# Patient Record
Sex: Female | Born: 1975 | Race: Black or African American | Hispanic: No | Marital: Single | Smoking: Never smoker
Health system: Southern US, Community
[De-identification: ages and names within clinical notes are randomized; demographics above are authoritative.]

## PROBLEM LIST (undated history)

## (undated) DIAGNOSIS — R768 Other specified abnormal immunological findings in serum: Secondary | ICD-10-CM

## (undated) DIAGNOSIS — E663 Overweight: Secondary | ICD-10-CM

## (undated) DIAGNOSIS — I10 Essential (primary) hypertension: Secondary | ICD-10-CM

## (undated) HISTORY — PX: CYST EXCISION: SHX5701

## (undated) HISTORY — DX: Other specified abnormal immunological findings in serum: R76.8

## (undated) HISTORY — DX: Essential (primary) hypertension: I10

## (undated) HISTORY — DX: Overweight: E66.3

---

## 1999-03-20 ENCOUNTER — Encounter: Payer: Self-pay | Admitting: Emergency Medicine

## 1999-03-20 ENCOUNTER — Emergency Department (HOSPITAL_COMMUNITY): Admission: EM | Admit: 1999-03-20 | Discharge: 1999-03-20 | Payer: Self-pay | Admitting: Emergency Medicine

## 2005-05-26 HISTORY — PX: SALPINGOOPHORECTOMY: SHX82

## 2009-06-18 ENCOUNTER — Ambulatory Visit: Payer: Self-pay | Admitting: Family

## 2009-06-18 DIAGNOSIS — J329 Chronic sinusitis, unspecified: Secondary | ICD-10-CM | POA: Insufficient documentation

## 2009-06-18 DIAGNOSIS — I10 Essential (primary) hypertension: Secondary | ICD-10-CM | POA: Insufficient documentation

## 2009-06-18 DIAGNOSIS — F329 Major depressive disorder, single episode, unspecified: Secondary | ICD-10-CM

## 2009-06-18 DIAGNOSIS — F419 Anxiety disorder, unspecified: Secondary | ICD-10-CM

## 2009-06-19 ENCOUNTER — Encounter (INDEPENDENT_AMBULATORY_CARE_PROVIDER_SITE_OTHER): Payer: Self-pay | Admitting: *Deleted

## 2009-07-03 ENCOUNTER — Ambulatory Visit: Payer: Self-pay | Admitting: Family

## 2009-07-03 LAB — CONVERTED CEMR LAB
ALT: 17 units/L (ref 0–35)
AST: 13 units/L (ref 0–37)
Albumin: 4.1 g/dL (ref 3.5–5.2)
Alkaline Phosphatase: 44 units/L (ref 39–117)
Basophils Absolute: 0 10*3/uL (ref 0.0–0.1)
Basophils Relative: 0.5 % (ref 0.0–3.0)
Bilirubin, Direct: 0.1 mg/dL (ref 0.0–0.3)
Cholesterol: 199 mg/dL (ref 0–200)
Eosinophils Absolute: 0.1 10*3/uL (ref 0.0–0.7)
Eosinophils Relative: 1.2 % (ref 0.0–5.0)
HCT: 42.4 % (ref 36.0–46.0)
HDL: 69.7 mg/dL (ref >39.00)
Hemoglobin: 13.1 g/dL (ref 12.0–15.0)
LDL Cholesterol: 118 mg/dL — ABNORMAL HIGH (ref 0–99)
Lymphocytes Relative: 29.7 % (ref 12.0–46.0)
Lymphs Abs: 1.9 10*3/uL (ref 0.7–4.0)
MCHC: 31 g/dL (ref 30.0–36.0)
MCV: 78.9 fL (ref 78.0–100.0)
Monocytes Absolute: 0.5 10*3/uL (ref 0.1–1.0)
Monocytes Relative: 8.2 % (ref 3.0–12.0)
Neutro Abs: 3.9 10*3/uL (ref 1.4–7.7)
Neutrophils Relative %: 60.4 % (ref 43.0–77.0)
Platelets: 267 10*3/uL (ref 150.0–400.0)
RBC: 5.37 M/uL — ABNORMAL HIGH (ref 3.87–5.11)
RDW: 13.1 % (ref 11.5–14.6)
TSH: 0.81 microintl units/mL (ref 0.35–5.50)
Total Bilirubin: 0.8 mg/dL (ref 0.3–1.2)
Total CHOL/HDL Ratio: 3
Total Protein: 7.1 g/dL (ref 6.0–8.3)
Triglycerides: 56 mg/dL (ref 0.0–149.0)
VLDL: 11.2 mg/dL (ref 0.0–40.0)
WBC: 6.4 10*3/uL (ref 4.5–10.5)

## 2009-07-04 ENCOUNTER — Encounter: Payer: Self-pay | Admitting: Family

## 2009-09-03 ENCOUNTER — Telehealth: Payer: Self-pay | Admitting: Family

## 2009-09-12 ENCOUNTER — Ambulatory Visit: Payer: Self-pay | Admitting: Family

## 2009-09-12 DIAGNOSIS — L0292 Furuncle, unspecified: Secondary | ICD-10-CM | POA: Insufficient documentation

## 2009-09-12 DIAGNOSIS — A6000 Herpesviral infection of urogenital system, unspecified: Secondary | ICD-10-CM | POA: Insufficient documentation

## 2009-09-12 DIAGNOSIS — L738 Other specified follicular disorders: Secondary | ICD-10-CM

## 2009-09-12 DIAGNOSIS — L0293 Carbuncle, unspecified: Secondary | ICD-10-CM

## 2009-09-14 ENCOUNTER — Telehealth: Payer: Self-pay | Admitting: Family

## 2009-09-17 ENCOUNTER — Encounter: Payer: Self-pay | Admitting: Family

## 2009-10-15 ENCOUNTER — Ambulatory Visit: Payer: Self-pay | Admitting: Family

## 2009-10-15 DIAGNOSIS — R238 Other skin changes: Secondary | ICD-10-CM

## 2009-10-15 DIAGNOSIS — R61 Generalized hyperhidrosis: Secondary | ICD-10-CM

## 2009-11-19 ENCOUNTER — Ambulatory Visit: Payer: Self-pay | Admitting: Family

## 2009-11-20 ENCOUNTER — Telehealth: Payer: Self-pay | Admitting: Family

## 2009-11-22 ENCOUNTER — Telehealth: Payer: Self-pay | Admitting: Family

## 2010-02-14 ENCOUNTER — Telehealth: Payer: Self-pay | Admitting: Family

## 2010-03-06 ENCOUNTER — Telehealth: Payer: Self-pay | Admitting: Family

## 2010-03-12 ENCOUNTER — Encounter: Payer: Self-pay | Admitting: Family

## 2010-04-10 ENCOUNTER — Telehealth: Payer: Self-pay | Admitting: Family

## 2010-04-22 ENCOUNTER — Ambulatory Visit: Payer: Self-pay | Admitting: Family

## 2010-04-22 DIAGNOSIS — E669 Obesity, unspecified: Secondary | ICD-10-CM

## 2010-04-22 LAB — CONVERTED CEMR LAB
BUN: 7 mg/dL (ref 6–23)
CO2: 26 meq/L (ref 19–32)
Calcium: 9.1 mg/dL (ref 8.4–10.5)
Chloride: 103 meq/L (ref 96–112)
Creatinine, Ser: 0.73 mg/dL (ref 0.40–1.20)
Glucose, Bld: 99 mg/dL (ref 70–99)
Potassium: 3.9 meq/L (ref 3.5–5.3)
Sodium: 138 meq/L (ref 135–145)

## 2010-04-23 ENCOUNTER — Telehealth: Payer: Self-pay | Admitting: Family

## 2010-04-24 ENCOUNTER — Encounter: Payer: Self-pay | Admitting: Family

## 2010-05-06 ENCOUNTER — Ambulatory Visit: Payer: Self-pay | Admitting: Licensed Clinical Social Worker

## 2010-06-24 ENCOUNTER — Telehealth: Payer: Self-pay | Admitting: Family

## 2010-06-25 NOTE — Progress Notes (Signed)
Summary: REFILL REQUEST--lm  Phone Note Refill Request Message from:  Fax from Pharmacy on September 03, 2009 10:10 AM  Refills Requested: Medication #1:  SERTRALINE 25MG  TABLET   Dosage confirmed as above?Dosage Confirmed   Brand Name Necessary? No   Supply Requested: 1 month   Last Refilled: 07/30/2009 [facility] [address] G[place]ette [place] PHONE [phone] FAX [phone]   Method Requested: Electronic Next Appointment Scheduled: NONE  Initial call taken by: Roselle Locus,  September 03, 2009 10:11 AM  Follow-up for Phone Call        Pt needs f/u now.  Left message on machine to return call.  Mervin Kung CMA  September 03, 2009 10:32 AM   Left message on machine to return call.  Mervin Kung CMA  September 04, 2009 8:34 AM   Left messag on pt's voicemail that a 30 day supply has been sent to the pharmacy but we will not be able to give further refills until pt. is seen.  Advised pt. to contact our office and schedule a follow-up with [name], NP.  Nicki Guadalajara Fergerson CMA  September 05, 2009 9:23 AM     New/Updated Medications: ZOLOFT 25 MG TABS (SERTRALINE HCL) Take one tablet by mouth daily Prescriptions: ZOLOFT 25 MG TABS (SERTRALINE HCL) Take one tablet by mouth daily  #30 x 0   Entered by:   Mervin Kung CMA   Authorized by:   Lemont Fillers FNP   Signed by:   Mervin Kung CMA on 09/05/2009   Method used:   Electronically to        [facility] [address] #[id number]* (retail)       [address]       [place], [place]  [address]       Ph: [phone]       Fax: [phone]   RxID:   [id number]   Current Allergies: No known allergies

## 2010-06-25 NOTE — Assessment & Plan Note (Signed)
Summary: 1 MONTH FOLLOW --room 5    Vital Signs:  Patient profile:   35 year old female Height:      66.5 inches Weight:      226 pounds BMI:     36.06 Temp:     98.3 degrees F oral Pulse rate:   72 / minute Pulse rhythm:   regular Resp:     18 per minute BP sitting:   132 / 90 Cuff size:   large  Vitals Entered By: Mervin Kung CMA (Oct 15, 2009 3:41 PM) CC: room 5  1 month follow up. Is Patient Diabetic? No   CC:  room 5  1 month follow up.Marland Kitchen  History of Present Illness: Whitney Harmon is a 35 year old female who presents today for follow up:  1) Anxiety- Last visit her Zoloft was increased from 25 mg to 50 mg. She notes decreased frequency of panic attacks since this was increased.  Overall feels calmer.  2) Hives- notes that these seem to be less frequent since she was taken off of her ACE inhibitor.  When this does occur she notices most frequently when she is stressed.  3) Sweating- notes that she sweats profusely which has become worse recently.  Feels "like my deodorant has stopped working"  4) Ecchymosis- Notes that she had some bruising in her legs last week which has resolved.    5) Folliculitis- notes frequent "boils" which she soaks and drains.  Responded well to Keflex recently- reports stable at present.  Allergies (verified): No Known Drug Allergies  Physical Exam  Lungs:  Normal respiratory effort, chest expands symmetrically. Lungs are clear to auscultation, no crackles or wheezes. Heart:  Normal rate and regular rhythm. S1 and S2 normal without gallop, murmur, click, rub or other extra sounds. Psych:  Cognition and judgment appear intact. Alert and cooperative with normal attention span and concentration. No apparent delusions, illusions, hallucinations   Impression & Recommendations:  Problem # 1:  ANXIETY (ICD-300.00) Assessment Improved Patient seems calmer this visit.  She is pleased with the progress she has made since we increased zoloft.   Continue current dosing. Her updated medication list for this problem includes:    Zoloft 50 Mg Tabs (Sertraline hcl) ..... One tablet by mouth daily  Problem # 2:  HYPERTENSION (ICD-401.9) Assessment: New Patient's weight loss clinic noted concern that her BB may be interfering with her weight loss plan.  Will change to Norvasc, plan follow up in 1 month. Her updated medication list for this problem includes:    Hydrochlorothiazide 25 Mg Tabs (Hydrochlorothiazide) ..... One tablet by mouth daily    Norvasc 5 Mg Tabs (Amlodipine besylate) ..... One tablet by mouth daily  Problem # 3:  ECCHYMOSES (ICD-782.9) Assessment: Improved Last CBC was stable, ecchymoses are resolving.  Monitor.  Problem # 4:  HYPERHIDROSIS (ICD-780.8) Assessment: New Will give trial of Drysol.    Complete Medication List: 1)  Zoloft 50 Mg Tabs (Sertraline hcl) .... One tablet by mouth daily 2)  Hydrochlorothiazide 25 Mg Tabs (Hydrochlorothiazide) .... One tablet by mouth daily 3)  Phentermine Hcl 15 Mg Caps (Phentermine hcl) .... Take 1 tab once daily 4)  Norvasc 5 Mg Tabs (Amlodipine besylate) .... One tablet by mouth daily 5)  Valtrex 1 Gm Tabs (Valacyclovir hcl) .... One tablet by mouth daily 6)  Drysol 20 % Soln (Aluminum chloride) .... Apply once daily  Patient Instructions: 1)  Please schedule a follow-up appointment in 1 month. Prescriptions: DRYSOL  20 % SOLN (ALUMINUM CHLORIDE) apply once daily  #1 x 3   Entered and Authorized by:   Lemont Fillers FNP   Signed by:   Lemont Fillers FNP on 10/15/2009   Method used:   Electronically to        [facility] #[id number]* (retail)       [address]       [place], [place]  [address]       Ph: [phone]       Fax: [phone]   RxID:   [id number] NORVASC 5 MG TABS (AMLODIPINE BESYLATE) one tablet by mouth daily  #30 x 1   Entered and Authorized by:   Lemont Fillers FNP   Signed by:    Lemont Fillers FNP on 10/15/2009   Method used:   Electronically to        [facility] #[id number]* (retail)       [address]       [place], [place]  [address]       Ph: [phone]       Fax: [phone]   RxID:   [id number]   Current Allergies (reviewed today): No known allergies

## 2010-06-25 NOTE — Letter (Signed)
   [place] at [facility] [address] [place], [place]  [address]  [place] Phone: [phone]      April 24, 2010   [facility] [address] [place], [place] [address]  RE:  LAB RESULTS  Dear  Whitney Harmon,  The following is an interpretation of your most recent lab tests.  Please take note of any instructions provided or changes to medications that have resulted from your lab work.  ELECTROLYTES:  Good - no changes needed  KIDNEY FUNCTION TESTS:  Good - no changes needed     Sincerely Yours,    Lemont Fillers FNP  Appended Document:  mailed

## 2010-06-25 NOTE — Letter (Signed)
Summary: Generic Letter  [place] [facility]  [address]   [place], [place] [address]   Phone: [phone]  Fax: [phone]      03/12/2010    [facility] [address] [place], [place]  [address]   Dear Ms. Meester,  We have been trying to reach you by phone and have been unsuccessful. Please let us know if your telephone numbers have recently changed.   Please call our office at [phone] Monday through Friday from 8am to 5pm to schedule an appointment with [name], NP. It is time to follow up with you on your blood pressure.  Sincerely,    [name], NP  Appended Document: Generic Letter Mailed.

## 2010-06-25 NOTE — Progress Notes (Signed)
Summary: needs appt--mailed letter  Phone Note Outgoing Call   Summary of Call: Please call patient and arrange a follow up visit. We need to follow up on her blood pressure. Initial call taken by: Lemont Fillers FNP,  March 06, 2010 4:33 PM  Follow-up for Phone Call        Attempted to reach pt. No answer, no voicemail. Nicki Guadalajara Fergerson CMA Duncan Dull)  March 08, 2010 11:42 AM   Additional Follow-up for Phone Call Additional follow up Details #1::        Unable to leave message on home phone. Left message to return my call at work number. Nicki Guadalajara Fergerson CMA Duncan Dull)  March 11, 2010 9:24 AM     Additional Follow-up for Phone Call Additional follow up Details #2::    Unable to reach pt by phone. Mailed contact letter to pt. Nicki Guadalajara Fergerson CMA Duncan Dull)  March 12, 2010 9:09 AM

## 2010-06-25 NOTE — Assessment & Plan Note (Signed)
Summary: 1 month fu/dt--Rm 4   Vital Signs:  Patient profile:   35 year old female Height:      66.5 inches Temp:     97.4 degrees F oral Pulse rate:   72 / minute Pulse rhythm:   regular Resp:     16 per minute BP sitting:   150 / 90  (right arm) Cuff size:   large  Vitals Entered By: Mervin Kung CMA (November 19, 2009 1:31 PM) CC: Room 4  1 month follow up.  Pt states she has been out of Norvasc x 3 days.   Is Patient Diabetic? No   CC:  Room 4  1 month follow up.  Pt states she has been out of Norvasc x 3 days.  Marland Kitchen  History of Present Illness: Whitney Harmon is a 35 year old female who presents today for follow up of her HTN.  Has not taken her norvasc. Reports that she went out of town and forgot to bring her meds- plans to pick up norvasc today.  Notes that BP was "good" at the bariatric center.    Anxiety-  notes she has difficultly "sitting still"  Denies recent panic attacks.    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 No apparent alopecia or balding. Lungs:  Normal respiratory effort, chest expands symmetrically. Lungs are clear to auscultation, no crackles or wheezes. Heart:  Normal rate and  regular rhythm. S1 and S2 normal without gallop, murmur, click, rub or other extra sounds. Skin:  + draining carbuncle on inner aspect of left thigh- some surrounding erythema is noted.   Impression & Recommendations:  Problem # 1:  HYPERTENSION (ICD-401.9) Assessment Deteriorated Patient ran out of Norvasc. Recommended that she resume norvasc.  Compliance was emphasized- plan for f/u nurse visit in 2 weeks for BP check Her updated medication list for this problem includes:    Hydrochlorothiazide 25 Mg Tabs (Hydrochlorothiazide) ..... One tablet by mouth daily    Norvasc 5 Mg Tabs (Amlodipine besylate) ..... One tablet by mouth daily  BP today: 150/90 Prior BP: 132/90 (10/15/2009)  Labs Reviewed: Chol: 199 (07/03/2009)   HDL: 69.70 (07/03/2009)   LDL: 118 (07/03/2009)   TG: 56.0 (07/03/2009)  Problem # 2:  FOLLICULITIS (ICD-704.8) Assessment: New Will treat with keflex.  Problem # 3:  ANXIETY (ICD-300.00) Assessment: Improved Improved, continue zoloft. Her updated medication list for this problem includes:    Zoloft 50 Mg Tabs (Sertraline hcl) ..... One tablet by mouth daily  Complete Medication List: 1)  Zoloft 50 Mg Tabs (Sertraline hcl) .... One tablet by mouth daily 2)  Hydrochlorothiazide 25 Mg Tabs (Hydrochlorothiazide) .... One tablet by mouth daily 3)  Phentermine Hcl 15 Mg Caps (Phentermine hcl) .... Take 1 tab once daily 4)  Norvasc 5 Mg Tabs (Amlodipine besylate) .... One tablet  by mouth daily 5)  Valtrex 1 Gm Tabs (Valacyclovir hcl) .... One tablet by mouth daily 6)  Drysol 20 % Soln (Aluminum chloride) .... Apply once daily 7)  Keflex 500 Mg Caps (Cephalexin) .... One cap by mouth twice daily x 7 days 8)  Fluconazole 150 Mg Tabs (Fluconazole) .... One tablet by mouth at start of yeast infection, may repeat in 3 days if symptoms persist 9)  Hcg Injections   Patient Instructions: 1)  Please resume norvasc. 2)  Follow up in 2 weeks for a nurse visit-  to check your  blood pressure.   3)  You will be contacted about your referral to local therapist. Prescriptions: NORVASC 5 MG TABS (AMLODIPINE BESYLATE) one tablet by mouth daily  #30 x 2   Entered and Authorized by:   Lemont Fillers FNP   Signed by:   Lemont Fillers FNP on 11/19/2009   Method used:   Electronically to        [facility] #[id number]* (retail)       [address]       [place], [place]  [address]       Ph: [phone]       Fax: [phone]   RxID:   [id number] HYDROCHLOROTHIAZIDE 25 MG TABS (HYDROCHLOROTHIAZIDE) one tablet by mouth daily  #30 Each x 2   Entered and Authorized by:   Lemont Fillers FNP   Signed by:   Lemont Fillers FNP on 11/19/2009   Method used:   Electronically to        [facility] #[id number]* (retail)       [address]       [place], [place]  [address]       Ph: [phone]       Fax: [phone]   RxID:   [id number] KEFLEX 500 MG CAPS (CEPHALEXIN) one cap by mouth twice daily x 7 days  #14 x 0   Entered and Authorized by:   Lemont Fillers FNP   Signed by:   Lemont Fillers FNP on 11/19/2009   Method used:   Electronically to        [facility] #[id number]* (retail)       [address]       [place], [place]  [address]       Ph: [phone]       Fax: [phone]   RxID:   [id number]   Current Allergies (reviewed today): No known allergies    Prevention & Chronic Care Immunizations   Influenza vaccine: Not documented    Tetanus booster: Not documented    Pneumococcal vaccine: Not documented  Other Screening   Pap smear: Not documented   Smoking status: never  (06/18/2009)  Hypertension   Last Blood Pressure: 150 / 90  (11/19/2009)   Serum creatinine: Not documented   Serum potassium Not documented  Self-Management Support :    Hypertension self-management  support: Not documented

## 2010-06-25 NOTE — Assessment & Plan Note (Signed)
Summary: NEW TO ESTABLISH   Vital Signs:  Patient profile:   35 year old female Height:      66.5 inches Weight:      231.50 pounds BMI:     36.94 Pulse rate:   60 / minute BP sitting:   140 / 80  Vitals Entered By: Kandice Hams (June 18, 2009 9:30 AM) CC: new pt to est   CC:  new pt to est.  History of Present Illness: Whitney Harmon is a 35 year old female who presents today to establish care. 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    Notes + history of hives, not sure if it is due to her medicines.  Not sure if it is stress related.  Patient is taking generic lotrel and HCTZ.  These hives started in June.    Patient has had mirena IUD since 8/09.  Notes that she wants to have this taken out.  Notes hx of abnormal discharge and + cramping.   Recently had left tube and L ovary removed due to large cyst.   HTN- notes that she has lost 70 pounds.  + exercise 3-5 times a week.     Preventative -Last tetanus <10 years ago- filled release of medical records.  + exercise  Preventive Screening-Counseling & Management  Alcohol-Tobacco     Smoking Status: never  Safety-Violence-Falls     Seat Belt Use: yes      Drug Use:  never.    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  Has tried xanax in past Neuro: Denies numbness     Physical Exam  General:  Overweight African American female, NAD Head:  Normocephalic and atraumatic without obvious abnormalities. No apparent alopecia or balding. Ears:  TM intact bilaterally Mouth:  Oral mucosa and oropharynx without lesions or exudates.  Teeth in good repair. Neck:  No deformities, masses, or tenderness noted. Breasts:  No mass, nodules, thickening, tenderness, bulging, retraction, inflamation, nipple discharge or skin changes noted.   Lungs:  Normal respiratory effort, chest expands symmetrically. Lungs are clear to auscultation, no crackles or wheezes. Heart:  Normal rate and regular rhythm. S1 and S2 normal without gallop, murmur, click, rub or other extra sounds. Abdomen:  Bowel sounds positive,abdomen soft and non-tender without masses, organomegaly or hernias noted. Msk:  No deformity or scoliosis noted of thoracic or lumbar spine.   Neurologic:  alert & oriented X3, strength normal in all extremities, and gait normal.   Skin:  Intact without suspicious lesions or rashes Psych:  Oriented  X3, normally interactive, and good eye contact.     Impression & Recommendations:  Problem # 1:  Preventive Health Care (ICD-V70.0) Will plan referral to GYN for pap and follow up of IUD- patient is considering removal.  Counselled patient on diet and exercise and weight loss.  Immunizations reviewed.  Declines flu shot Orders: Gynecologic Referral (Gyn)  Problem # 2:  SINUSITIS (ICD-473.9) Assessment: New Patient with tooth pain.  + nasal congestin will treat with amoxicillin Her updated medication list for this problem  includes:    Amoxicillin 500 Mg Cap (Amoxicillin) .Marland Kitchen... Take 1 capsule by mouth three times a day x 10 days  Problem # 3:  HYPERTENSION (ICD-401.9) Assessment: Comment Only I am concerned about the danger of birth defects on ACE given that patient is considering removal of her IUD.  Will change Lotrel/amlodipine 10/5  to labetalol- with plan to titrate upward as needed.  Patient is to continue HCTZ BP today: 140/80  Her updated medication list for this problem includes:    Labetalol Hcl 100 Mg Tabs (Labetalol hcl) ..... One tablet by mouth two times a day    Hydrochlorothiazide 25 Mg Tabs (Hydrochlorothiazide) ..... One tablet by mouth daily  Problem # 4:  ANXIETY (ICD-300.00) Notes that she has had some mild symptoms of depression.  Also notes occasional anxiety- uses xanax occasionally.  Will give trial of zoloft.  Discussed side effects of medicine and advised patient to discontinue if suicide ideation and go to ER. Patient verbalized understanding.  Patient instructed to follow up with me in 1 month.   Her updated medication list for this problem includes:    Zoloft 25 Mg Tabs (Sertraline hcl) .Marland Kitchen... Take one half tablet by mouth daily x 1 week, then one tablet by mouth daily  Complete Medication List: 1)  Labetalol Hcl 100 Mg Tabs (Labetalol hcl) .... One tablet by mouth two times a day 2)  Amoxicillin 500 Mg Cap (Amoxicillin) .... Take 1 capsule by mouth three times a day x 10 days 3)  Zoloft 25 Mg Tabs (Sertraline hcl) .... Take one half tablet by mouth daily x 1 week, then one tablet by mouth daily 4)  Hydrochlorothiazide 25 Mg Tabs (Hydrochlorothiazide) .... One tablet by mouth daily  Patient Instructions: 1)  Please return fasting for the following labs  2)  TSH, CBC, FLP, LFT's, TSH (V70) 3)  Follow up in 2 weeks for a follow up BP check 4)  You will be called about your appointment with GYN. Prescriptions: LABETALOL HCL 100 MG TABS (LABETALOL HCL) one tablet by mouth two  times a day  #60 x 0   Entered and Authorized by:   Lemont Fillers FNP   Signed by:   Lemont Fillers FNP on 06/18/2009   Method used:   Electronically to        [facility] [phone]* (retail)       [address]       [place], [place]  [address]       Ph: [phone]       Fax: [phone]   RxID:   [id number] ZOLOFT 25 MG TABS (SERTRALINE HCL) take one half tablet by mouth daily x 1 week, then one tablet by mouth daily  #30 x 1   Entered and Authorized by:   Lemont Fillers FNP   Signed by:   Lemont Fillers FNP on 06/18/2009  Method used:   Electronically to        [facility] [phone]* (retail)       [address]       [place], [place]  [address]       Ph: [phone]       Fax: [phone]   RxID:   [id number] AMOXICILLIN 500 MG CAP (AMOXICILLIN) Take 1 capsule by mouth three times a day X 10 days  #30 x 0   Entered and Authorized by:   Lemont Fillers FNP   Signed by:   Lemont Fillers FNP on 06/18/2009   Method used:   Electronically to        [facility] #[id number]* (retail)       [address]       [place], [place]  [address]       Ph: [phone]       Fax: [phone]   RxID:   [id number]

## 2010-06-25 NOTE — Progress Notes (Signed)
Summary: refill--Fluconazole  Phone Note Refill Request Message from:  Fax from [facility] Rd [phone] on 11/19/09  4:49pm  Refills Requested: Medication #1:  Fluconazole 150mg  Take 1 tablet by mouth as needed for vaginal yeast infection. May repeat in 3 days if needed.   Dosage confirmed as above?Dosage Confirmed   Supply Requested: 2   Last Refilled: 09/12/2009 Pt states she would like to have it as she is starting Keflex rx and usually gets a yeast infection with antibiotics.  Initial call taken by: Mervin Kung CMA,  November 20, 2009 10:36 AM  Follow-up for Phone Call        Rx sent to [facility] on [address]. Follow-up by: Lemont Fillers FNP,  November 20, 2009 10:49 AM  Additional Follow-up for Phone Call Additional follow up Details #1::        Pt notified rx sent to pharmacy.  Nicki Guadalajara Fergerson CMA  November 21, 2009 1:16 PM     New/Updated Medications: FLUCONAZOLE 150 MG TABS (FLUCONAZOLE) one tablet by mouth at start of yeast infection, may repeat in 3 days if symptoms persist Prescriptions: FLUCONAZOLE 150 MG TABS (FLUCONAZOLE) one tablet by mouth at start of yeast infection, may repeat in 3 days if symptoms persist  #2 x 0   Entered and Authorized by:   Lemont Fillers FNP   Signed by:   Lemont Fillers FNP on 11/20/2009   Method used:   Electronically to        [facility] #[id number]* (retail)       [address]       [place], [place]  [address]       Ph: [phone]       Fax: [phone]   RxID:   [id number]

## 2010-06-25 NOTE — Letter (Signed)
Summary: Generic Letter  [place] at [facility]  [address]   [place], [place] [address]   Phone: [phone]  Fax: [phone]       09/17/2009    [facility] [address] [place], [place]  [address]    Dear Ms. Franssen,  We have been unable to contact you by phone and have some information to share with you.  Please contact us at [phone] Monday-Friday from 8am to 5pm.  Please verify your telephone numbers when you call.  Sincerely,    Vilma Prader

## 2010-06-25 NOTE — Letter (Signed)
Summary: Primary Care Consult Scheduled Letter  [facility]  [address] [place], [place] [address]   Phone: [phone]  Fax: [phone]      06/19/2009 MRN: [id number]  Whitney Harmon [address] [place], [place]  [address]    Dear Ms. Kibby,    We have scheduled an appointment for you.  At the recommendation of [name], FNP, we have scheduled you a consult with Dr. Marcelle Overlie with [facility] on 07-11-2009 arrive by 3:15pm.  Their address is [address], [place] [address]. The office phone number is [phone].  If this appointment day and time is not convenient for you, please feel free to call the office of the doctor you are being referred to at the number listed above and reschedule the appointment.    It is important for you to keep your scheduled appointments. We are here to make sure you are given good patient care.   Thank you,    Renee, Patient Care Coordinator  at [facility]    **IF YOU ARE UNABLE TO KEEP THIS APPOINTMENT, OR NEED TO RESCHEDULE, PLEASE GIVE DR. HOLLAND'S OFFICE A 24 HOUR NOTICE TO AVOID ANY LATE CANCELLATION OR NOSHOW FEES**

## 2010-06-25 NOTE — Progress Notes (Signed)
Summary: unable to contact, letter mailed  Phone Note Outgoing Call   Summary of Call: Attempted to call pt.  No answer at home number.  Pls call pt on Monday and notify her that I have sent rx to her pharmacy for valtrex. Initial call taken by: Lemont Fillers FNP,  September 14, 2009 12:01 PM  Follow-up for Phone Call        Attempted to reach pt.  No answer at home.  Cell # does not have a voicemail box that hs been set up.  Letter mailed to pt to contact office.  Nicki Guadalajara Fergerson CMA  September 17, 2009 1:44 PM     New/Updated Medications: VALTREX 1 GM TABS (VALACYCLOVIR HCL) one tablet by mouth daily Prescriptions: VALTREX 1 GM TABS (VALACYCLOVIR HCL) one tablet by mouth daily  #30 x 5   Entered and Authorized by:   Lemont Fillers FNP   Signed by:   Lemont Fillers FNP on 09/14/2009   Method used:   Electronically to        [facility] #[id number]* (retail)       [address]       [place], [place]  [address]       Ph: [phone]       Fax: [phone]   RxID:   [id number]

## 2010-06-25 NOTE — Assessment & Plan Note (Signed)
Summary: fu meds/dt   Vital Signs:  Patient profile:   35 year old female Height:      66.5 inches Weight:      223.50 pounds BMI:     35.66 Temp:     98.3 degrees F oral Pulse rate:   66 / minute Pulse rhythm:   regular Resp:     16 per minute BP sitting:   132 / 68  (right arm) Cuff size:   regular  Vitals Entered By: Mervin Kung CMA (September 12, 2009 2:51 PM) CC: room 3  Follow up of medications. Pt would like to discuss changing Labetalol due to side effect of weight gain. Is Patient Diabetic? No   CC:  room 3  Follow up of medications. Pt would like to discuss changing Labetalol due to side effect of weight gain.Marland Kitchen  History of Present Illness: Whitney Harmon is a 35 year old female who presents today for follow up.    Anxiety-  She is currently on Zoloft 25 mg.  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    Folliculitis- Patient notes that she frequently has cysts on her inner thighs and groin area- have flared up. Request antibiotics.    Allergies (verified): No Known Drug Allergies  Physical Exam  General:  Well-developed,well-nourished,in no acute distress; alert,appropriate and cooperative throughout examination Lungs:  Normal respiratory effort, chest expands symmetrically. Lungs are clear to auscultation, no crackles or wheezes. Heart:  Normal rate and regular rhythm. S1 and S2 normal without gallop, murmur, click, rub or other extra sounds. Skin:  several small carbuncles noted on right inner thigh, one with mild associated drainage.     Impression & Recommendations:  Problem # 1:  ANXIETY (ICD-300.00) Assessment  Unchanged Will try increasing zoloft from 25 to 50mg  as her anxiety seems to be interfering with her job.   Her updated medication list for this problem includes:    Zoloft 50 Mg Tabs (Sertraline hcl) ..... One tablet by mouth daily  Problem # 2:  CARBUNCLE AND FURUNCLE OF UNSPECIFIED SITE (ICD-680.9) Assessment: New Will treat with Keflex.    Problem # 3:  GENITAL HERPES (ICD-054.10) Assessment: Comment Only Pt will call with dosage of valtrex.    Complete Medication List: 1)  Zoloft 50 Mg Tabs (Sertraline hcl) .... One tablet by mouth daily 2)  Hydrochlorothiazide 25 Mg Tabs (Hydrochlorothiazide) .... One tablet by mouth daily 3)  Phentermine Hcl 15 Mg Caps (Phentermine hcl) .... Take 1 tab once daily 4)  Labetalol Hcl 200 Mg Tabs (Labetalol hcl) .... One tablet by mouth two times a day 5)  Keflex 500 Mg Caps (Cephalexin) .... One tab by mouth two times a day x 7 days  Patient Instructions: 1)  Please follow up in 1 month.  2)  Keep up the good work with diet/exercise. 3)  Take the labetalol twice daily. Prescriptions: KEFLEX 500 MG CAPS (CEPHALEXIN) one tab by mouth two times a day x 7 days  #14 x 0   Entered and Authorized by:   Lemont Fillers FNP   Signed by:  Lemont Fillers FNP on 09/12/2009   Method used:   Electronically to        [facility] #[id number]* (retail)       [address]       [place], [place]  [address]       Ph: [phone]       Fax: [phone]   RxID:   [id number] ZOLOFT 50 MG TABS (SERTRALINE HCL) one tablet by mouth daily  #30 x 5   Entered and Authorized by:   Lemont Fillers FNP   Signed by:   Lemont Fillers FNP on 09/12/2009   Method used:   Electronically to        [facility] #[id number]* (retail)       [address]       [place], [place]  [address]       Ph: [phone]       Fax: [phone]   RxID:    [id number]   Current Allergies (reviewed today): No known allergies

## 2010-06-25 NOTE — Progress Notes (Signed)
Summary: refill--amlodipine  Phone Note Refill Request Message from:  Fax from Pharmacy on February 14, 2010 8:44 AM  Refills Requested: Medication #1:  amlodipine besylate 5 mg tab   Dosage confirmed as above?Dosage Confirmed   Brand Name Necessary? No   Supply Requested: 1 month   Last Refilled: 01/10/2010  Method Requested: Electronic Next Appointment Scheduled: none Initial call taken by: Roselle Locus,  February 14, 2010 8:45 AM    Prescriptions: NORVASC 5 MG TABS (AMLODIPINE BESYLATE) one tablet by mouth daily  #30 x 2   Entered by:   Mervin Kung CMA (AAMA)   Authorized by:   Lemont Fillers FNP   Signed by:   Mervin Kung CMA (AAMA) on 02/14/2010   Method used:   Electronically to        [facility] #[id number]* (retail)       [address]       [place], [place]  [address]       Ph: [phone]       Fax: [phone]   RxID:   [id number]

## 2010-06-25 NOTE — Letter (Signed)
   [facility] [address] [place], [place] [address] [phone]    July 04, 2009   FAITH PATRICELLI [address] [place], [place] [address]  RE:  LAB RESULTS  Dear  Ms. Lamarque,  The following is an interpretation of your most recent lab tests.  Please take note of any instructions provided or changes to medications that have resulted from your lab work.  LIVER FUNCTION TESTS:  Good - no changes needed  LIPID PANEL:  Good - no changes needed Triglyceride: 56.0   Cholesterol: 199   LDL: 118   HDL: 69.70   Chol/HDL%:  3   CBC:  Good - no changes needed   Sincerely Yours,    Lemont Fillers FNP

## 2010-06-25 NOTE — Assessment & Plan Note (Signed)
Summary: follow up/mhf--Rm 5   Vital Signs:  Patient profile:   35 year old female Height:      66.5 inches Weight:      247.25 pounds BMI:     39.45 Temp:     98.2 degrees F oral Pulse rate:   90 / minute Pulse rhythm:   regular Resp:     18 per minute BP sitting:   138 / 86  (right arm) Cuff size:   large  Vitals Entered By: Mervin Kung CMA Duncan Dull) (April 22, 2010 3:57 PM) CC: Pt here for follow up of blood pressure. Is Patient Diabetic? No Pain Assessment Patient in pain? no      Comments Pt has completed Keflex and Fluconazole. Needs refill on HCTZ, has been out x 2 months. Nicki Guadalajara Fergerson CMA Duncan Dull)  April 22, 2010 4:04 PM    Primary Care Provider:  Lemont Fillers FNP  CC:  Pt here for follow up of blood pressure.Marland Kitchen  History of Present Illness: Ms Lagrange is a 35 year old female who presents today for follow up.    1)Anxiety- reports that this is better controlled with addition of Zoloft.  2) HSV- No break outs.  Continues daily suppressive therapy.  3)HTN- notes that she has gained 10 pounds this last week, has been off of HCTZ for approximately 5 days- ran out.  Continues norvasc.  4) Obesity-  Stopped going to the gym and weight loss clinic over the summer and reports that she gained 14 pounds.  Thinks that she has an eating disorder- eats for reasons other than hunger such as stress.  Wants to look into alternate approach to weight loss.   Allergies (verified): No Known Drug Allergies  Past History:  Past Medical History: Last updated: 06/18/2009 Hypertension overweight  Past Surgical History: Last updated: 06/18/2009 left ovary tube removed  Review of Systems       see HPI  Physical Exam  General:  Well-developed,well-nourished,in no acute distress; alert,appropriate and cooperative throughout examination Lungs:  Normal respiratory effort, chest expands symmetrically. Lungs are clear to auscultation, no crackles or  wheezes. Heart:  Normal rate and regular rhythm. S1 and S2 normal without gallop, murmur, click, rub or other extra sounds. Extremities:  No clubbing, cyanosis, edema, or deformity noted with normal full range of motion of all joints.     Impression & Recommendations:  Problem # 1:  ANXIETY (ICD-300.00) Assessment Improved  Continue zoloft, symptoms are improved. Will refer to a therapist however to assist her with the psychological component of her eating habits.   Her updated medication list for this problem includes:    Zoloft 50 Mg Tabs (Sertraline hcl) ..... One tablet by mouth daily  Orders: Psychology Referral (Psychology)  Problem # 2:  HYPERTENSION (ICD-401.9) Assessment: Improved Resume HCTZ, continue Norvasc. Her updated medication list for this problem includes:    Hydrochlorothiazide 25 Mg Tabs (Hydrochlorothiazide) ..... One tablet by mouth daily    Norvasc 5 Mg Tabs (Amlodipine besylate) ..... One tablet by mouth daily  Orders: TLB-BMP (Basic Metabolic Panel-BMET) (80048-METABOL)  BP today: 138/86 Prior BP: 150/90 (11/19/2009)  Labs Reviewed: Chol: 199 (07/03/2009)   HDL: 69.70 (07/03/2009)   LDL: 118 (07/03/2009)   TG: 56.0 (07/03/2009)  Problem # 3:  GENITAL HERPES (ICD-054.10) Assessment: Improved No breakouts while on suppressive therapy.  Continue same.  Complete Medication List: 1)  Zoloft 50 Mg Tabs (Sertraline hcl) .... One tablet by mouth daily 2)  Hydrochlorothiazide 25 Mg  Tabs (Hydrochlorothiazide) .... One tablet by mouth daily 3)  Phentermine Hcl 37.5 Mg Tabs (Phentermine hcl) .... Take 1 tablet by mouth once a day. 4)  Norvasc 5 Mg Tabs (Amlodipine besylate) .... One tablet by mouth daily 5)  Valtrex 1 Gm Tabs (Valacyclovir hcl) .... One tablet by mouth daily 6)  Drysol 20 % Soln (Aluminum chloride) .... Apply once daily 7)  Hcg Injections  8)  Mirena 20 Mcg/24hr Iud (Levonorgestrel)  Patient Instructions: 1)  You will be contacted about  your referral to see the therapist. 2)   Please schedule a follow-up appointment in 3 months. Prescriptions: ZOLOFT 50 MG TABS (SERTRALINE HCL) one tablet by mouth daily  #30 x 5   Entered and Authorized by:   Lemont Fillers FNP   Signed by:   Lemont Fillers FNP on 04/22/2010   Method used:   Print then Give to Patient   RxID:   [id number] VALTREX 1 GM TABS (VALACYCLOVIR HCL) one tablet by mouth daily  #30 x 5   Entered and Authorized by:   Lemont Fillers FNP   Signed by:   Lemont Fillers FNP on 04/22/2010   Method used:   Print then Give to Patient   RxID:   [id number] NORVASC 5 MG TABS (AMLODIPINE BESYLATE) one tablet by mouth daily  #30 x 2   Entered and Authorized by:   Lemont Fillers FNP   Signed by:   Lemont Fillers FNP on 04/22/2010   Method used:   Print then Give to Patient   RxID:   [id number] HYDROCHLOROTHIAZIDE 25 MG TABS (HYDROCHLOROTHIAZIDE) one tablet by mouth daily  #30 Each x 2   Entered and Authorized by:   Lemont Fillers FNP   Signed by:   Lemont Fillers FNP on 04/22/2010   Method used:   Electronically to        [facility] #[id number]* (retail)       [address]       [place], [place]  [address]       Ph: [phone]       Fax: [phone]   RxID:   [id number]    Orders Added: 1)  TLB-BMP (Basic Metabolic Panel-BMET) [80048-METABOL] 2)  Psychology Referral [Psychology] 3)  Est. Patient Level III [01093]    Current Allergies (reviewed today): No known allergies

## 2010-06-25 NOTE — Progress Notes (Signed)
  Phone Note Outgoing Call   Summary of Call: Called patient back to discuss her use of HCG as a weight loss agent as being prescribed by her weight loss clinic.  Review of Uptodate reveals that this is not an approved drug.  Also, review of product information reveals that it is NOT effective in treatment of obesity.  Recommended to patient that she discontinue the HCG. Initial call taken by: Lemont Fillers FNP,  November 22, 2009 11:30 AM

## 2010-06-25 NOTE — Progress Notes (Signed)
Summary: REFILL--Sertraline  Phone Note Refill Request Message from:  Fax from Pharmacy on April 23, 2010 2:37 PM  Refills Requested: Medication #1:  SERTRALINE 50 MG TABLET  TAKE 1 TAB BY MOUTH DAILY   Dosage confirmed as above?Dosage Confirmed   Brand Name Necessary? No   Supply Requested: 1 month   Last Refilled: 03/21/2010 [facility] [address] [place]  FAX [phone]   Method Requested: Electronic Next Appointment Scheduled: 07-15-10 [facility]  Initial call taken by: Roselle Locus,  April 23, 2010 2:39 PM  Follow-up for Phone Call        Pt was seen 04/22/10 and given written rx. Unable to reach pt at number listed and unable to leave message. Spoke to Florence at [facility] and notified her that pt has written rx. Nicki Guadalajara Fergerson CMA Duncan Dull)  April 23, 2010 3:19 PM

## 2010-06-25 NOTE — Assessment & Plan Note (Signed)
Summary: 2 WEEK FU/KDC   Vital Signs:  Patient profile:   35 year old female Weight:      231 pounds Temp:     98.2 degrees F oral Pulse rate:   68 / minute BP sitting:   140 / 92  (left arm)  Vitals Entered By: Jeremy Johann CMA (July 03, 2009 4:04 PM) CC: f/u bp check Comments REVIEWED MED LIST, PATIENT AGREED DOSE AND INSTRUCTION CORRECT    CC:  f/u bp check.  History of Present Illness: Whitney Harmon is a 35 year old female who presents today for follow up.  Notes that she continues to have sinus pressure.  She did not fill amoxicillin initially as she thought that her symptoms were feeling better.  Then she noted worsening symptoms and decided to fill amoxicillin, she took it for 3 days then discontinued due to vaginal yeast infection- now having mild discharge and itching.  Used monistat x 1 with good relief.    HTN- BP is up today, has not used any OTC sinus preps in > 1 week.  Started phentermine 2 weeks ago as part of the bariatric program  Anxiety- started zoloft, starting to notice some improvement.    Allergies (verified): No Known Drug Allergies  Physical Exam  General:  Well-developed,well-nourished,in no acute distress; alert,appropriate and cooperative throughout examination Head:  Normocephalic and atraumatic without obvious abnormalities. No apparent alopecia or balding. Ears:  External ear exam shows no significant lesions or deformities.  Otoscopic examination reveals clear canals, tympanic membranes are intact bilaterally without bulging, retraction, inflammation or discharge. Hearing is grossly normal bilaterally. Neck:  No deformities, masses, or tenderness noted. Lungs:  Normal respiratory effort, chest expands symmetrically. Lungs are clear to auscultation, no crackles or wheezes. Heart:  Normal rate and regular rhythm. S1 and S2 normal without gallop, murmur, click, rub or other extra sounds.   Impression & Recommendations:  Problem # 1:   SINUSITIS (ICD-473.9) Patient will resume amoxicillin, will give a prescription for diflucan as needed for recurrent yeast infection.  Patient to call me if she continues to have symptoms when she completes abx or if symptoms worsen.  Problem # 2:  HYPERTENSION (ICD-401.9) Assessment: New Patient was recently changed for ACE to labetalol.  Will plan to titrate upward.  She has also been started on phentermine which may be increasing her BP.  Plan f/u in 1 month.  Her updated medication list for this problem includes:    Labetalol Hcl 100 Mg Tabs (Labetalol hcl) ..... One tablet by mouth two times a day    Hydrochlorothiazide 25 Mg Tabs (Hydrochlorothiazide) ..... One tablet by mouth daily    Labetalol Hcl 200 Mg Tabs (Labetalol hcl) ..... One tablet by mouth two times a day  Problem # 3:  ANXIETY (ICD-300.00) Assessment: Improved Symptoms are improving, continue zoloft.   Her updated medication list for this problem includes:    Zoloft 25 Mg Tabs (Sertraline hcl) .Marland Kitchen... Take one half tablet by mouth daily x 1 week, then one tablet by mouth daily  Complete Medication List: 1)  Labetalol Hcl 100 Mg Tabs (Labetalol hcl) .... One tablet by mouth two times a day 2)  Zoloft 25 Mg Tabs (Sertraline hcl) .... Take one half tablet by mouth daily x 1 week, then one tablet by mouth daily 3)  Hydrochlorothiazide 25 Mg Tabs (Hydrochlorothiazide) .... One tablet by mouth daily 4)  Phentermine Hcl 15 Mg Caps (Phentermine hcl) .... Take 1 tab once daily  5)  Labetalol Hcl 200 Mg Tabs (Labetalol hcl) .... One tablet by mouth two times a day 6)  Fluconazole 150 Mg Tabs (Fluconazole) .... One tab by mouth as needed vaginal yeast infection, may repeat in 3 days if needed  Patient Instructions: 1)  Please schedule a follow-up appointment in 1 month. 2)  Call if you develop fever over 101, increasing sinus pressure, pain with eye movement, increased facial tenderness of swelling, or if you develop visual changes,  or if symptoms do not improve by the time you are completing your antibiotics. Prescriptions: FLUCONAZOLE 150 MG TABS (FLUCONAZOLE) one tab by mouth as needed vaginal yeast infection, may repeat in 3 days if needed  #1 x 1   Entered and Authorized by:   Lemont Fillers FNP   Signed by:   Lemont Fillers FNP on 07/03/2009   Method used:   Electronically to        [facility] [phone]* (retail)       [address]       [place], [place]  [address]       Ph: [phone]       Fax: [phone]   RxID:   [id number] LABETALOL HCL 200 MG TABS (LABETALOL HCL) one tablet by mouth two times a day  #60 x 1   Entered and Authorized by:   Lemont Fillers FNP   Signed by:   Lemont Fillers FNP on 07/03/2009   Method used:   Electronically to        [facility] #[id number]* (retail)       [address]       [place], [place]  [address]       Ph: [phone]       Fax: [phone]   RxID:   [id number]

## 2010-06-25 NOTE — Progress Notes (Signed)
Summary: refill request--HCTZ  Phone Note Refill Request Message from:  Fax from [facility] [address] on April 10, 2010 8:30 AM  Refills Requested: Medication #1:  HYDROCHLOROTHIAZIDE 25 MG TABS one tablet by mouth daily   Dosage confirmed as above?Dosage Confirmed   Supply Requested: 1 month   Last Refilled: 02/14/2010 Pt was last seen June 27. Needs appointment.  Next Appointment Scheduled: none Initial call taken by: Mervin Kung CMA Duncan Dull),  April 10, 2010 8:31 AM  Follow-up for Phone Call        Left message on pharmacy voicemail that pt needs to contact our office, refill denied.  Pt needs appointment, have been unable to reach pt after numerous attempt by phone and letter. Nicki Guadalajara Fergerson CMA Duncan Dull)  April 10, 2010 8:42 AM

## 2010-07-03 NOTE — Progress Notes (Signed)
Summary: refill request--norvasc  Phone Note Refill Request Message from:  Fax from Pharmacy on June 24, 2010 8:48 AM  Refills Requested: Medication #1:  NORVASC 5 MG TABS one tablet by mouth daily   Dosage confirmed as above?Dosage Confirmed   Brand Name Necessary? No   Supply Requested: 1 month   Last Refilled: 05/23/2010 [facility] [address] [place] [address] fax [phone]   Method Requested: Electronic Next Appointment Scheduled: 02.20.12 Whitney Harmon Initial call taken by: Elba Barman,  June 24, 2010 8:50 AM    Prescriptions: NORVASC 5 MG TABS (AMLODIPINE BESYLATE) one tablet by mouth daily  #30 x 0   Entered by:   Mervin Kung CMA (AAMA)   Authorized by:   Lemont Fillers FNP   Signed by:   Mervin Kung CMA (AAMA) on 06/24/2010   Method used:   Electronically to        [facility] #[id number]* (retail)       [address]       [place], [place]  [address]       Ph: [phone]       Fax: [phone]   RxID:   [id number]

## 2010-07-15 ENCOUNTER — Ambulatory Visit (INDEPENDENT_AMBULATORY_CARE_PROVIDER_SITE_OTHER): Payer: BC Managed Care – PPO | Admitting: Family

## 2010-07-15 ENCOUNTER — Encounter: Payer: Self-pay | Admitting: Family

## 2010-07-15 DIAGNOSIS — F329 Major depressive disorder, single episode, unspecified: Secondary | ICD-10-CM | POA: Insufficient documentation

## 2010-07-15 DIAGNOSIS — R5381 Other malaise: Secondary | ICD-10-CM | POA: Insufficient documentation

## 2010-07-15 DIAGNOSIS — R5383 Other fatigue: Secondary | ICD-10-CM

## 2010-07-15 DIAGNOSIS — J329 Chronic sinusitis, unspecified: Secondary | ICD-10-CM

## 2010-07-15 DIAGNOSIS — J069 Acute upper respiratory infection, unspecified: Secondary | ICD-10-CM | POA: Insufficient documentation

## 2010-07-15 DIAGNOSIS — L500 Allergic urticaria: Secondary | ICD-10-CM | POA: Insufficient documentation

## 2010-07-15 LAB — CONVERTED CEMR LAB
Basophils Absolute: 0 10*3/uL (ref 0.0–0.1)
Hemoglobin: 13 g/dL (ref 12.0–15.0)
IgE (Immunoglobulin E), Serum: 201.8 intl units/mL — ABNORMAL HIGH (ref 0.0–180.0)
IgE (Immunoglobulin E), Serum: 204.6 intl units/mL — ABNORMAL HIGH (ref 0.0–180.0)
Lymphocytes Relative: 22 % (ref 12–46)
Monocytes Absolute: 0.7 10*3/uL (ref 0.1–1.0)
Neutro Abs: 7.7 10*3/uL (ref 1.7–7.7)
Neutrophils Relative %: 70 % (ref 43–77)
RDW: 14.1 % (ref 11.5–15.5)
Rapid Strep: NEGATIVE
TSH: 1.061 microintl units/mL (ref 0.350–4.500)

## 2010-07-18 ENCOUNTER — Encounter: Payer: Self-pay | Admitting: Family

## 2010-07-21 ENCOUNTER — Telehealth: Payer: Self-pay | Admitting: Family

## 2010-07-22 ENCOUNTER — Telehealth: Payer: Self-pay | Admitting: Family

## 2010-07-23 NOTE — Assessment & Plan Note (Signed)
Summary: 3 month fu/mhf--rm 5   Vital Signs:  Patient profile:   35 year old female Height:      66.5 inches Temp:     98.0 degrees F oral Pulse rate:   78 / minute Pulse rhythm:   regular Resp:     16 per minute BP sitting:   128 / 86  (right arm) Cuff size:   large  Vitals Entered By: Mervin Kung CMA Duncan Dull) (July 15, 2010 3:50 PM) CC: Pt here for 3 month follow up. Has had head and chest congestion and sore throat x 1 week. Feels very fatigued. Is Patient Diabetic? No Pain Assessment Patient in pain? no      Comments Pt has tried numerous OTC meds without symptom relief. Pt currently holding off Phentermine but is considering restarting. Nicki Guadalajara Fergerson CMA Duncan Dull)  July 15, 2010 3:55 PM    Primary Care Provider:  Lemont Fillers FNP  CC:  Pt here for 3 month follow up. Has had head and chest congestion and sore throat x 1 week. Feels very fatigued.Marland Kitchen  History of Present Illness: Whitney Harmon is a 35 year old female who presents today for follow.  1) URI +post nasal drip, clear nasal discharge.  5 days.  Denies associated fever.  Mild sore throat.  Has tired otc coricidin, vitamin C echinacea.  MVI  2)  Anxiety- Continues sertraline.  Feels some improvement.  Has gained weight.  Feels like I "don't care." has actually lost 6 pounds.  Did not go to the appointment with the therapist.  Notes that her energy is low.  Trying to wean herself off of the phentermine.  + irritability.  Trouble falling asleep.  Mind is racing.  + Excessive worrying.  Job has been very stressful.  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  Feels mentally drained.  Feels like she doesn't like to be around people,  trouble motivating.  Denies associated visual or auditory hallucinations.  Denies spending sprees of manic episodes.    3)urticaria-notes that this has been associated with stress.  Has not been able to associate with a specific food.    Allergies (verified): No  Known Drug Allergies  Past History:  Past Medical History: Hypertension overweight childood asthma  Physical Exam  General:  Overweight, tired AA female, awake, alert, NAD, pleasant Head:  + wig Ears:  External ear exam shows no significant lesions or deformities.  Otoscopic examination reveals clear canals, tympanic membranes are intact bilaterally without bulging, retraction, inflammation or discharge. Hearing is grossly normal bilaterally. Lungs:  Normal respiratory effort, chest expands symmetrically. Lungs are clear to auscultation, no crackles or wheezes. Heart:  Normal rate and regular rhythm. S1 and S2 normal without gallop, murmur, click, rub or other extra sounds. Psych:  Cognition and judgment appear intact. Alert and cooperative with normal attention span and concentration. No apparent delusions, illusions, hallucinations   Impression & Recommendations:  Problem # 1:  ANXIETY (ICD-300.00) Assessment Deteriorated Pt with difficulty sleeping, lack of motivation.  Suspect element of depression as well.  Will add alprazolam at bedtime and increase her zoloft to 100mg .  Recommended that she reschedule her therapist visit.  Pt encouraged to get 8 hours of sleep a night and start exercising again.  45 minutes were spent with the patient today.  Greater than 50% of this time was spent counseling patient on her depression, anxiety, and weight loss.   Her updated medication list for this problem includes:  Zoloft 100 Mg Tabs (Sertraline hcl) ..... One tablet by mouth daily    Alprazolam 0.25 Mg Tabs (Alprazolam) ..... One tablet by mouth at bedtime  Problem # 2:  UPPER RESPIRATORY INFECTION, VIRAL (ICD-465.9)  Symptoms most consistent with viral URI.  She notes some tightness with her breathing.  Had asthma as a child.  Sample proair [facility] 04 2013) given to patient today to use PRN  Her updated medication list for this problem includes:    Claritin 10 Mg Tabs (Loratadine) .....  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  Consider referral to allergist based on results.  Recommended that she add a daily claritin in the meantime.  Orders: T-Food Allergy Profile Specific IgE (86003/[id number]) T-Allergy Profile Region II-DC, [place], [place], [place], [place] [phone])  Complete Medication List: 1)  Zoloft 100 Mg Tabs (Sertraline hcl) .... One tablet by mouth daily 2)  Hydrochlorothiazide 25 Mg Tabs (Hydrochlorothiazide) .... One tablet by mouth daily 3)  Phentermine Hcl 37.5 Mg Tabs (Phentermine hcl) .... Take 1 tablet by mouth once a day. 4)  Norvasc 5 Mg Tabs (Amlodipine besylate) .... One tablet by mouth daily 5)  Valtrex 1 Gm Tabs (Valacyclovir hcl) .... One tablet by mouth daily 6)  Drysol 20 % Soln (Aluminum chloride) .... Apply once daily 7)  Mirena 20 Mcg/24hr Iud (Levonorgestrel) 8)  Alprazolam 0.25 Mg Tabs (Alprazolam) .... One tablet by mouth at bedtime 9)  Proair Hfa 108 (90 Base) Mcg/act Aers (Albuterol sulfate) .... 2 puffs every 6 hours as needed for wheezing 10)  Claritin 10 Mg Tabs (Loratadine) .... One tablet by mouth daily  Other Orders: Rapid Strep (09811) TLB-CBC Platelet - w/Differential (85025-CBCD) TLB-TSH (Thyroid Stimulating Hormone) (91478-GNF)  Patient Instructions: 1)  Please call [phone] to reschedule your therapist appointment. 2)  Please complete your lab work on the first floor. 3)  Follow up in 1 month.   Prescriptions: PROAIR HFA 108 (90 BASE) MCG/ACT AERS (ALBUTEROL SULFATE) 2 puffs every 6 hours as needed for wheezing  #1 x 0   Entered and Authorized by:   Lemont Fillers FNP   Signed by:   Lemont Fillers FNP on 07/15/2010   Method used:   Samples Given   RxID:   [id number] ALPRAZOLAM 0.25 MG TABS (ALPRAZOLAM) one tablet by mouth at bedtime  #30 x 0   Entered and Authorized by:   Lemont Fillers FNP   Signed by:   Lemont Fillers FNP on 07/15/2010   Method used:   Print then Give to Patient   RxID:   [id number] ZOLOFT 100 MG TABS (SERTRALINE HCL) one tablet by mouth daily  #30 x 1   Entered and Authorized by:   Lemont Fillers FNP   Signed by:   Lemont Fillers FNP on 07/15/2010   Method used:   Electronically to        [id number]* (retail)       [address]       [place], [place]  [address]       Ph: [phone]       Fax: [phone]   RxID:   [id number]    Orders Added: 1)  Rapid Strep [25427] 2)  T-Food Allergy Profile Specific IgE [86003/[id number]] 3)  T-Allergy Profile Region II-DC, [place], [place], [place], [place] [0623] 4)  TLB-CBC Platelet - w/Differential [85025-CBCD] 5)  TLB-TSH (Thyroid Stimulating Hormone) [[address]-UEA]    Current Allergies (reviewed today): No known allergies    Laboratory Results   Date/Time Reported: Mervin Kung CMA Duncan Dull)  July 15, 2010 4:15 PM   Other Tests  Rapid Strep: negative  Kit Test Internal QC: Positive   (Normal Range: Negative)   Appended Document: 3 month fu/mhf--rm 5 No urticaria present on day of exam. mouth: no pharyngeal erythema or exudates. Neck- supple, no cervical LAD.

## 2010-07-25 ENCOUNTER — Encounter (INDEPENDENT_AMBULATORY_CARE_PROVIDER_SITE_OTHER): Payer: Self-pay | Admitting: *Deleted

## 2010-07-26 ENCOUNTER — Encounter: Payer: Self-pay | Admitting: Family

## 2010-08-01 NOTE — Letter (Signed)
Summary: Primary Care Consult Scheduled Letter  [place] [facility]  [address]   [place], [place] [address]   Phone: [phone]  Fax: [phone]      07/25/2010 MRN: [id number]  Whitney Harmon [address] [place], [place]  [address]  [place]    Dear Ms. Show,      We have scheduled an appointment for you.  At the recommendation of MELISSA O'SULLIVAN FNP, we have scheduled you a consult with DR Otto Herb PULMONARY  on APRIL 3,2012 at 3:30PM.  Their address is_[address], [place] . The office phone number is [phone].  If this appointment day and time is not convenient for you, please feel free to call the office of the doctor you are being referred to at the number listed above and reschedule the appointment.     It is important for you to keep your scheduled appointments. We are here to make sure you are given good patient care.   Thank you,  Darral Dash Patient Care Coordinator [place] at [facility]

## 2010-08-01 NOTE — Progress Notes (Signed)
Summary: refill-hydrochlorothiazide  Phone Note Refill Request Message from:  Fax from Pharmacy on July 22, 2010 8:55 AM  Refills Requested: Medication #1:  HYDROCHLOROTHIAZIDE 25 MG TABS one tablet by mouth daily   Dosage confirmed as above?Dosage Confirmed   Brand Name Necessary? No   Supply Requested: 1 month   Last Refilled: 04/22/2010 [facility] [address], [place] [address] fax [phone]   Method Requested: Electronic Next Appointment Scheduled: 3.21.12 Zya Finkle Initial call taken by: Elba Barman,  July 22, 2010 8:56 AM    Prescriptions: HYDROCHLOROTHIAZIDE 25 MG TABS (HYDROCHLOROTHIAZIDE) one tablet by mouth daily  #30 Each x 2   Entered by:   Mervin Kung CMA (AAMA)   Authorized by:   Lemont Fillers FNP   Signed by:   Mervin Kung CMA (AAMA) on 07/22/2010   Method used:   Electronically to        [id number]* (retail)       [address]       [place], [place]  [address]       Ph: [phone]       Fax: [phone]   RxID:   [id number]

## 2010-08-01 NOTE — Progress Notes (Signed)
Summary: lab results  Phone Note Outgoing Call   Summary of Call: Left message for pt to return our call.  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  I would like her to avoid foods containing these items.  I will also refer her to an allergist for further evaluation.  She should continue claritin daily to help with the environmental allergens- grasses etc. I have also sent an epi pen to her pharmacy for her to keep on hand in case she develops severe hives, tongue/lip swelling or associated shortness of breath.  This is to be injected into the muscle of her thigh. Initial call taken by: Lemont Fillers FNP,  July 23, 2010 8:52 AM  Follow-up for Phone Call        Left message on work voicemail to return my call.  Nicki Guadalajara Fergerson CMA Duncan Dull)  July 24, 2010 2:39 PM   Additional Follow-up for Phone Call Additional follow up Details #1::        Spoke to pt. She has been made aware of results and referral date and time. Nicki Guadalajara Fergerson CMA Duncan Dull)  July 25, 2010 3:27 PM     New/Updated Medications: EPIPEN 2-PAK 0.3 MG/0.3ML DEVI (EPINEPHRINE) Inject into muscle if you develop severe hives, tongue, lip swelling, or associated shortness of breath. Prescriptions: EPIPEN 2-PAK 0.3 MG/0.3ML DEVI (EPINEPHRINE) Inject into muscle if you develop severe hives, tongue, lip swelling, or associated shortness of breath.  #1 x 0   Entered and Authorized by:   Lemont Fillers FNP   Signed by:   Lemont Fillers FNP on 07/23/2010   Method used:   Electronically to        [id number]* (retail)       [address]       [place], [place]  [address]       Ph: [phone]       Fax: [phone]   RxID:   [id number]

## 2010-08-14 ENCOUNTER — Ambulatory Visit: Payer: BC Managed Care – PPO | Admitting: Family

## 2010-08-14 ENCOUNTER — Telehealth: Payer: Self-pay | Admitting: *Deleted

## 2010-08-14 NOTE — Telephone Encounter (Signed)
Received message from pt stating that she picked up her refill of Zoloft at pharmacy but it still had 50 mg 1 tablet daily.  Pt thought we had increased dose to 100mg  once a day?  Per office note of 07/15/10 rx was sent electronically for 100mg  once a day.  Left message on machine for pt to return my call.

## 2010-08-15 NOTE — Telephone Encounter (Signed)
Left message on machine at work # to return my call. Unable to leave message at home phone.

## 2010-08-16 NOTE — Telephone Encounter (Signed)
Spoke with pt and advised her to take 2 of the 50mg  tablet to equal 100mg  per 07/15/10 office note. She will ask pharmacy if they have 07/15/10 rx on file for 100mg  once a day when it is time to refill med again.

## 2010-08-20 ENCOUNTER — Ambulatory Visit: Payer: BC Managed Care – PPO | Admitting: Internal Medicine

## 2010-08-22 ENCOUNTER — Encounter: Payer: Self-pay | Admitting: Family

## 2010-08-22 ENCOUNTER — Ambulatory Visit (INDEPENDENT_AMBULATORY_CARE_PROVIDER_SITE_OTHER): Payer: BC Managed Care – PPO | Admitting: Family

## 2010-08-22 VITALS — BP 140/78 | HR 78 | Temp 98.1°F | Resp 16 | Ht 66.5 in | Wt 255.1 lb

## 2010-08-22 DIAGNOSIS — L500 Allergic urticaria: Secondary | ICD-10-CM

## 2010-08-22 MED ORDER — DULOXETINE HCL 60 MG PO CPEP: 60.0000 mg | ORAL_CAPSULE | Freq: Every day | ORAL | Status: DC

## 2010-08-22 NOTE — Assessment & Plan Note (Addendum)
Patient has an upcoming appointment with Dr. Maple Hudson.  Rast testing was noted to show mild allergy to peanuts and apples. She has an EpiPen in case of severe reaction.

## 2010-08-22 NOTE — Progress Notes (Signed)
  Subjective:    Patient ID: Whitney Harmon, female    DOB: 01-30-1976, 35 y.o.   MRN: [id number]  HPI  Whitney Harmon is a 35 year old female who presents today for routine followup.  Anxiety-  Last visit her sertraline was increased from 50 mg to 100 mg.Since that increase she notes that she feels more calm. She is better able to tolerate her job and the children.  Sleeping better. 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  Almost done with school.   Weight gain-she notes that she had been at the weight loss clinic and taking phentermine until approximately 2 months ago. She felt at that time that the phentermine wasn't helping her. She has gained 8 pounds since her last visit. Chart review notes that she has gained approximately 30 pounds since last April.  Allergic urticaria- Notes that she is still having intermittent occasional hives. She has upcoming appointment with Dr. Jetty Duhamel. Review of Systems    see history of present illness Objective:   Physical Exam    general calm and pleasant, no acute distress Psychiatric:Alert and oriented x3. Calm and pleasant. Speech is nonpressured. Not homicidal total or suicidal.    Assessment & Plan:  08 2013 [id number] A cymbalta 60 mg #35 samples provided  35 minutes were spent with the patient today. Greater than 50% of time was spent on counseling.

## 2010-08-22 NOTE — Patient Instructions (Signed)
Please follow up in 1 month.  

## 2010-08-23 ENCOUNTER — Encounter: Payer: Self-pay | Admitting: Internal Medicine

## 2010-08-27 ENCOUNTER — Institutional Professional Consult (permissible substitution): Payer: BC Managed Care – PPO | Admitting: Internal Medicine

## 2010-09-20 ENCOUNTER — Ambulatory Visit (INDEPENDENT_AMBULATORY_CARE_PROVIDER_SITE_OTHER): Payer: BC Managed Care – PPO | Admitting: Family

## 2010-09-20 ENCOUNTER — Encounter: Payer: Self-pay | Admitting: Family

## 2010-09-20 VITALS — BP 144/76 | HR 84 | Temp 98.7°F | Resp 16 | Wt 263.0 lb

## 2010-09-20 DIAGNOSIS — I10 Essential (primary) hypertension: Secondary | ICD-10-CM

## 2010-09-20 DIAGNOSIS — L709 Acne, unspecified: Secondary | ICD-10-CM | POA: Insufficient documentation

## 2010-09-20 DIAGNOSIS — R635 Abnormal weight gain: Secondary | ICD-10-CM | POA: Insufficient documentation

## 2010-09-20 DIAGNOSIS — F329 Major depressive disorder, single episode, unspecified: Secondary | ICD-10-CM

## 2010-09-20 DIAGNOSIS — L708 Other acne: Secondary | ICD-10-CM

## 2010-09-20 MED ORDER — DULOXETINE HCL 60 MG PO CPEP: 60.0000 mg | ORAL_CAPSULE | Freq: Every day | ORAL | Status: DC

## 2010-09-20 MED ORDER — BENZOYL PEROXIDE-ERYTHROMYCIN 5-3 % EX GEL: CUTANEOUS | Status: DC

## 2010-09-20 MED ORDER — AMLODIPINE BESYLATE 5 MG PO TABS: ORAL_TABLET | ORAL | Status: DC

## 2010-09-20 NOTE — Progress Notes (Signed)
Subjective:    Patient ID: Whitney Harmon, female    DOB: [birth year], 35 y.o.   MRN: [id number]  HPI  Depression- Feels good.  Feels like she is dancing and singing more, playing more with her son.  Started working out again this week.  She scheduled to meet with therapist- Judithe Modest. Tolerating cymbalta.   Allergic reaction- she is scheduled to see allergist- no further reactions.  Weight gain- She is committed to weight loss routine and hopes to start losing weight.   Acne- notes recent worsening of acne during the last 1 month.  She notes increased stress at work.    Review of Systems see HPI    Past Medical History  Diagnosis Date  . Hypertension   . Asthma     childhood  . Overweight     History   Social History  . Marital Status: Single    Spouse Name: N/A    Number of Children: 1  . 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 Smoking status: Never Smoker   . Smokeless tobacco: Not on file  . Alcohol Use: Yes     social, once a week  . Drug Use: No  . Sexually Active: Not on file   Other Topics Concern  . Not on file   Social History Narrative  . No narrative on file    Past Surgical History  Procedure Date  . Salpingoophorectomy     left    Family History  Problem Relation Age of Onset  . Arthritis Mother     osteoarthritis  . Diabetes Mother   . Hypertension Mother   . Mental illness Mother     schizophrenia  . Obesity Mother   . Hypertension Father   . Hypertension Other   . Alcohol abuse      No Known Allergies  Current Outpatient Prescriptions on File Prior to Visit  Medication Sig Dispense Refill  . albuterol (PROAIR HFA) 108 (90 BASE) MCG/ACT inhaler Inhale 2 puffs into the lungs every 6 (six) hours as needed. For wheezing       . ALPRAZolam (XANAX) 0.25 MG tablet Take 0.25 mg by mouth at bedtime as needed.        Marland Kitchen aluminum chloride (DRYSOL) 20 % external  solution Apply topically daily.        Marland Kitchen EPINEPHrine (EPIPEN) 0.3 mg/0.3 mL DEVI Inject 0.3 mg into the muscle once. Inject into muscle if you develop severe hives, tongue, lip swelling or associated shortness of breath.       . hydrochlorothiazide 25 MG tablet Take 25 mg by mouth daily.        Marland Kitchen levonorgestrel (MIRENA) 20 MCG/24HR IUD 1 each by Intrauterine route once.        . loratadine (CLARITIN) 10 MG tablet Take 10 mg by mouth daily.        . valACYclovir (VALTREX) 1000 MG tablet Take 1,000 mg by mouth daily as needed.       Marland Kitchen DISCONTD: amLODipine (NORVASC) 5 MG tablet Take 5 mg by mouth daily.       Marland Kitchen DISCONTD: DULoxetine (CYMBALTA) 60 MG capsule Take 1 capsule (60 mg total) by mouth daily.  35 capsule  0  . DISCONTD: phentermine 37.5 MG capsule Take 37.5 mg by mouth daily.          BP 144/76  Pulse 84  Temp(Src) 98.7 F (37.1 C) (Oral)  Resp 16  Wt 263 lb (119.296 kg)    Objective:   Physical Exam  Constitutional: She appears well-developed and well-nourished.  Cardiovascular: Normal rate and regular rhythm.   Pulmonary/Chest: Effort normal and breath sounds normal.  Skin:       Mild facial acne is noted.  Psychiatric: She has a normal mood and affect. Her speech is normal. Judgment and thought content normal. Cognition and memory are normal.          Assessment & Plan:  08 2013 [id number] A #21 Cymbalta samples provided. 25 minutes spent with patient.  Greater than 50% of this time was spent counseling patient on her depression, anxiety and weight loss.

## 2010-09-20 NOTE — Assessment & Plan Note (Addendum)
BP Readings from Last 3 Encounters:  09/20/10 144/76  08/22/10 140/78  07/15/10 128/86   BP is borderline elevated the last 2 visits.  Will increase the norvasc to 7.5mg  daily.

## 2010-09-20 NOTE — Patient Instructions (Signed)
Please follow up in 2 weeks for a Blood pressure check- nurse visit. Follow up for routine appointment in 3 months- sooner if problems or concerns.

## 2010-09-20 NOTE — Assessment & Plan Note (Signed)
Will add benz/eryth gel.

## 2010-09-20 NOTE — Assessment & Plan Note (Signed)
Clinically stable on Cymbalta.  Pt to see therapist. Continue same.

## 2010-09-20 NOTE — Assessment & Plan Note (Signed)
TSH wnl last visit.  I encouraged her to work hard on diet and exercise.

## 2010-09-23 ENCOUNTER — Ambulatory Visit (INDEPENDENT_AMBULATORY_CARE_PROVIDER_SITE_OTHER): Payer: BC Managed Care – PPO | Admitting: Licensed Clinical Social Worker

## 2010-09-23 DIAGNOSIS — F411 Generalized anxiety disorder: Secondary | ICD-10-CM

## 2010-09-28 ENCOUNTER — Other Ambulatory Visit: Payer: Self-pay | Admitting: Family

## 2010-10-03 ENCOUNTER — Encounter: Payer: Self-pay | Admitting: Family

## 2010-10-04 ENCOUNTER — Ambulatory Visit (INDEPENDENT_AMBULATORY_CARE_PROVIDER_SITE_OTHER): Payer: BC Managed Care – PPO | Admitting: Family

## 2010-10-04 VITALS — BP 136/94 | HR 84 | Resp 16

## 2010-10-04 DIAGNOSIS — L738 Other specified follicular disorders: Secondary | ICD-10-CM

## 2010-10-04 DIAGNOSIS — I10 Essential (primary) hypertension: Secondary | ICD-10-CM

## 2010-10-04 MED ORDER — AMLODIPINE BESYLATE 10 MG PO TABS: 10.0000 mg | ORAL_TABLET | Freq: Every day | ORAL | Status: DC

## 2010-10-04 MED ORDER — CEPHALEXIN 500 MG PO CAPS: 500.0000 mg | ORAL_CAPSULE | Freq: Four times a day (QID) | ORAL | Status: AC

## 2010-10-04 NOTE — Patient Instructions (Signed)
Please follow up in 1 month.  

## 2010-10-04 NOTE — Progress Notes (Signed)
Subjective:    Patient ID: Whitney Harmon, female    DOB: [birth year], 35 y.o.   MRN: [id number]  HPI Whitney Harmon is a 35 year old female who presents today for followup of her high blood pressure.  1. Hypertension-last visit her amlodipine was increased from 5 mg by mouth daily to 7.5 mg by mouth daily she is tolerating this dose. She does note that she missed several doses last week and when she was out of town.  2. Hidradenitis Suppurativa- she notes several draining boils in her groin area. She has been applying warm compresses.   Review of Systems  See history of present illness  Past Medical History  Diagnosis Date  . Hypertension   . Asthma     childhood  . Overweight     History   Social History  . Marital Status: Single    Spouse Name: N/A    Number of Children: 1  . 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 Smoking status: Never Smoker   . Smokeless tobacco: Not on file  . Alcohol Use: Yes     social, once a week  . Drug Use: No  . Sexually Active: Not on file   Other Topics Concern  . Not on file   Social History Narrative  . No narrative on file    Past Surgical History  Procedure Date  . Salpingoophorectomy     left    Family History  Problem Relation Age of Onset  . Arthritis Mother     osteoarthritis  . Diabetes Mother   . Hypertension Mother   . Mental illness Mother     schizophrenia  . Obesity Mother   . Schizophrenia Mother   . Hypertension Father   . Alcohol abuse    . Diabetes    . Hypertension      No Known Allergies  Current Outpatient Prescriptions on File Prior to Visit  Medication Sig Dispense Refill  . albuterol (PROAIR HFA) 108 (90 BASE) MCG/ACT inhaler Inhale 2 puffs into the lungs every 6 (six) hours as needed. For wheezing       . ALPRAZolam (XANAX) 0.25 MG tablet Take 0.25 mg by mouth at bedtime as needed.        Marland Kitchen aluminum chloride (DRYSOL) 20  % external solution Apply topically daily.        . benzoyl peroxide-erythromycin (BENZAMYCIN) gel Apply to affected area 2 times daily  47 g  0  . DULoxetine (CYMBALTA) 60 MG capsule Take 1 capsule (60 mg total) by mouth daily.  30 capsule  2  . EPINEPHrine (EPIPEN) 0.3 mg/0.3 mL DEVI Inject 0.3 mg into the muscle once. Inject into muscle if you develop severe hives, tongue, lip swelling or associated shortness of breath.       . hydrochlorothiazide 25 MG tablet Take 25 mg by mouth daily.        Marland Kitchen levonorgestrel (MIRENA) 20 MCG/24HR IUD 1 each by Intrauterine route once.        . loratadine (CLARITIN) 10 MG tablet Take 10 mg by mouth daily.        . valACYclovir (VALTREX) 1000 MG tablet Take 1,000 mg by mouth daily as needed.       Marland Kitchen DISCONTD: amLODipine (NORVASC) 5 MG tablet 1 and 1/2 tabs once daily  45 tablet  2    BP 136/94  Pulse  84  Resp 16  SpO2 100%    Objective:   Physical Exam     General: Awake alert and in no acute distress Cardiovascular S1-S2 regular rate and rhythm no murmurs noted Respiratory: Breath sounds are clear to auscultation bilaterally without wheezes rales or rhonchi Skin: She is noted to have several small less than 1 cm draining boils in the right groin.    Assessment & Plan:

## 2010-10-04 NOTE — Assessment & Plan Note (Addendum)
BP Readings from Last 3 Encounters:  10/04/10 136/94  09/20/10 144/76  08/22/10 140/78   Will increase her amlodipine from 7.5 mg to 10 mg by mouth daily with plans for followup in one month.

## 2010-10-04 NOTE — Assessment & Plan Note (Signed)
Will treat with Keflex. Patient is to continue warm compresses. Call if symptoms worsen or do not improve.

## 2010-10-08 ENCOUNTER — Encounter: Payer: Self-pay | Admitting: Internal Medicine

## 2010-10-11 ENCOUNTER — Ambulatory Visit: Payer: BC Managed Care – PPO | Admitting: Licensed Clinical Social Worker

## 2010-10-11 ENCOUNTER — Other Ambulatory Visit: Payer: Self-pay | Admitting: *Deleted

## 2010-10-11 ENCOUNTER — Encounter: Payer: Self-pay | Admitting: Internal Medicine

## 2010-10-11 ENCOUNTER — Ambulatory Visit (INDEPENDENT_AMBULATORY_CARE_PROVIDER_SITE_OTHER): Payer: BC Managed Care – PPO | Admitting: Internal Medicine

## 2010-10-11 ENCOUNTER — Other Ambulatory Visit (INDEPENDENT_AMBULATORY_CARE_PROVIDER_SITE_OTHER): Payer: BC Managed Care – PPO

## 2010-10-11 VITALS — BP 144/92 | HR 81 | Ht 66.5 in | Wt 267.6 lb

## 2010-10-11 DIAGNOSIS — L500 Allergic urticaria: Secondary | ICD-10-CM

## 2010-10-11 LAB — HEPATIC FUNCTION PANEL
ALT: 14 U/L (ref 0–35)
AST: 14 U/L (ref 0–37)
Total Bilirubin: 0.4 mg/dL (ref 0.3–1.2)
Total Protein: 7.7 g/dL (ref 6.0–8.3)

## 2010-10-11 LAB — CBC WITH DIFFERENTIAL/PLATELET
Basophils Absolute: 0 10*3/uL (ref 0.0–0.1)
Hemoglobin: 13.1 g/dL (ref 12.0–15.0)
Lymphocytes Relative: 21 % (ref 12.0–46.0)
Monocytes Relative: 4.4 % (ref 3.0–12.0)
Neutro Abs: 7.4 10*3/uL (ref 1.4–7.7)
Neutrophils Relative %: 73.2 % (ref 43.0–77.0)
RDW: 14 % (ref 11.5–14.6)

## 2010-10-11 LAB — TSH: TSH: 0.81 u[IU]/mL (ref 0.35–5.50)

## 2010-10-11 NOTE — Progress Notes (Signed)
  Subjective:    Patient ID: Whitney Harmon, female    DOB: [birth year], 35 y.o.   MRN: [id number]  HPI 10/11/10- 69 yoF never smoker seen at request of M. Peggyann Juba, NP for allergy evaluation because of hives. She had hives once as a child, treated with an injection. The current problem began 3 years ago while living in [place]. Onset was usually in the evenings. Benadryl would help but made her sleepy.Worse if stressed or after eating. Pattern persisted after moving to [place] 2 years ago. 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 Food IgE 204.6 on 07/15/10.  Now she takes daily Claritin, often supplemented with benadryl. Avoids suspect foods. Meds have been changed many times without effect. Hives still occur daily or every other day.  Asthma as a child, with hospitalization. Allergic rhinitis stopped as a teen.  Has been on Minerva hormone eluting IUD x 3 years but not sure whether hives came first.   Review of Systems Constitutional:   No weight loss, night sweats,  Fevers, chills, fatigue, lassitude. HEENT:   No headaches,  Difficulty swallowing,  Tooth/dental problems,  Sore throat,                No sneezing, itching, ear ache, nasal congestion, post nasal drip,   CV:  No chest pain,  Orthopnea, PND, swelling in lower extremities, anasarca, dizziness, palpitations  GI  No heartburn, indigestion, abdominal pain, nausea, vomiting, diarrhea, change in bowel habits, loss of appetite  Resp: No shortness of breath with exertion or at rest.  No excess mucus, no productive cough,  No non-productive cough,  No coughing up of blood.  No change in color of mucus.  No wheezing.   Skin: no rash or lesions.  GU: no dysuria, change in color of urine, no urgency or frequency.  No flank pain.  MS:  No joint pain or swelling.  No decreased range of motion.  No back pain.  Psych:  No change in mood or affect. No depression or anxiety.  No memory loss.      Objective:   Physical  Exam General- Alert, Oriented, Affect-appropriate, Distress- none acute   obese  Skin- rash-none, lesions- none, excoriation- none    No urticarial lesions present today  Lymphadenopathy- none  Head- atraumatic  Eyes- Gross vision intact, PERRLA, conjunctivae clear secretions  Ears- Hearing, canals- normal  Nose- Clear, No-Septal dev, mucus, polyps, erosion, perforation   Throat- Mallampati II , mucosa clear , drainage- none, tonsils- atrophic  Neck- flexible , trachea midline, no stridor , thyroid nl, carotid no bruit  Chest - symmetrical excursion , unlabored     Heart/CV- RRR , no murmur , no gallop  , no rub, nl s1 s2                     - JVD- none , edema- none, stasis changes- none, varices- none     Lung- clear to P&A, wheeze- none, cough- none , dullness-none, rub- none     Chest wall-  Abd- tender-no, distended-no, bowel sounds-present, HSM- no  Br/ Gen/ Rectal- Not done, not indicated  Extrem- cyanosis- none, clubbing, none, atrophy- none, strength- nl  Neuro- grossly intact to observation           Assessment & Plan:

## 2010-10-11 NOTE — Assessment & Plan Note (Addendum)
She had urticaria as a child, when her atopy was more evident. On that basis she may have a familial pattern, since she doesn't know her father's side. I am very suspicious of the hormone eluting IUD and suggested she discuss that with her GYN. We will look for herditary predisposition, check for liver, thyroid and CBC clues. I don't know that skin testing will be useful, despite her positives on in vitro IgE testing. We will come back to that. Try combination antihistamines against type 1 plus type 2 receptors.

## 2010-10-11 NOTE — Patient Instructions (Signed)
Discuss your IUD with your GYN. It is possible that the hormone being released is the problem. I would like to see how you do with no hormone supplements at all for 3 months.  Try taking Allegra 180/ fexofenadine once daily, and zantac 150/ ranitidine twice daily every day for a month.    Order labs- CBC w/ diff,  TSH, Liver panel, ANA, Sed rate, angioedema panel

## 2010-10-14 ENCOUNTER — Encounter: Payer: Self-pay | Admitting: Internal Medicine

## 2010-10-14 LAB — ANTI-NUCLEAR AB-TITER (ANA TITER): ANA Titer 1: 1:640 {titer} — ABNORMAL HIGH

## 2010-10-15 ENCOUNTER — Telehealth: Payer: Self-pay | Admitting: Family

## 2010-10-15 DIAGNOSIS — R768 Other specified abnormal immunological findings in serum: Secondary | ICD-10-CM

## 2010-10-17 ENCOUNTER — Ambulatory Visit: Payer: BC Managed Care – PPO | Admitting: Licensed Clinical Social Worker

## 2010-10-17 NOTE — Progress Notes (Signed)
Quick Note:  LMTCB ______ 

## 2010-10-18 ENCOUNTER — Telehealth: Payer: Self-pay | Admitting: Family

## 2010-10-18 NOTE — Telephone Encounter (Signed)
Pt states that Whitney Harmon left her a message to call her back?

## 2010-10-21 NOTE — Telephone Encounter (Signed)
Spoke with patient, reviewed finding of + ANA and plan for referral to Rheumatology.  Pt verbalizes understanding.

## 2010-10-22 NOTE — Telephone Encounter (Signed)
I did not call her, maybe it was [place].  Please let her know.

## 2010-10-22 NOTE — Telephone Encounter (Signed)
Per Myriam Jacobson, she has called pt. Advised pt there were no pending messages or results to be discussed at present. Pt voices understanding.

## 2010-10-24 ENCOUNTER — Ambulatory Visit (INDEPENDENT_AMBULATORY_CARE_PROVIDER_SITE_OTHER): Payer: BC Managed Care – PPO | Admitting: Family Medicine

## 2010-10-24 ENCOUNTER — Encounter: Payer: Self-pay | Admitting: Family Medicine

## 2010-10-24 ENCOUNTER — Encounter: Payer: BC Managed Care – PPO | Admitting: Family Medicine

## 2010-10-24 VITALS — BP 140/74 | HR 72 | Temp 98.0°F | Resp 16

## 2010-10-24 DIAGNOSIS — J209 Acute bronchitis, unspecified: Secondary | ICD-10-CM

## 2010-10-24 NOTE — Progress Notes (Signed)
OFFICE NOTE  10/24/2010  CC:  Chief Complaint  Patient presents with  . Sinusitis    Pt states she has had intermittent dizziness since Friday. Has had head pressure, teeth pain, cough and sore throat since Tuesday.     HPI:   Patient is a 35 y.o. African-American female who is here for cough. Last week had some mild nasal congestion, some headaches and teeth pain on and off.   Then 2 d/a she developed ST and cough, chest feels tight and she feels slightly SOB.  Denies wheezing--has not used her ProAir. Lost her voice yesterday.  No fevers.   +intermittent lightheaded/woozy feeling last few days, feels tired.   Took OTC cough/cold med (the kind ok for people with HTN) and it helped some last night.  Also taking her loratidine.    Pertinent PMH:  Asthma, intermittent--has been relatively quiescent since childhood per pt. HTN Anxiety Allergic urticaria  MEDS;   Outpatient Prescriptions Prior to Visit  Medication Sig Dispense Refill  . albuterol (PROAIR HFA) 108 (90 BASE) MCG/ACT inhaler Inhale 2 puffs into the lungs every 6 (six) hours as needed. For wheezing       . ALPRAZolam (XANAX) 0.25 MG tablet Take 0.25 mg by mouth at bedtime as needed.        Marland Kitchen aluminum chloride (DRYSOL) 20 % external solution Apply topically daily.        Marland Kitchen amLODipine (NORVASC) 10 MG tablet Take 1 tablet (10 mg total) by mouth daily.  30 tablet  1  . benzoyl peroxide-erythromycin (BENZAMYCIN) gel Apply to affected area 2 times daily  47 g  0  . DULoxetine (CYMBALTA) 60 MG capsule Take 1 capsule (60 mg total) by mouth daily.  30 capsule  2  . EPINEPHrine (EPIPEN) 0.3 mg/0.3 mL DEVI Inject 0.3 mg into the muscle once. Inject into muscle if you develop severe hives, tongue, lip swelling or associated shortness of breath.       . hydrochlorothiazide 25 MG tablet Take 25 mg by mouth daily.        Marland Kitchen levonorgestrel (MIRENA) 20 MCG/24HR IUD 1 each by Intrauterine route once.        . loratadine (CLARITIN) 10 MG  tablet Take 10 mg by mouth daily.        . valACYclovir (VALTREX) 1000 MG tablet Take 1,000 mg by mouth daily as needed.         PE: Blood pressure 140/74, pulse 72, temperature 98 F (36.7 C), temperature source Oral, resp. rate 16. VS: noted--normal. Gen: alert, NAD, NONTOXIC APPEARING. HEENT: eyes without injection, drainage, or swelling.  Ears: EACs clear, TMs with normal light reflex and landmarks. Nose: no mucous noted.  Mucosa unremarkable.  No paranasal sinus TTP.  No facial swelling.  Throat and mouth without focal lesion.  No pharyngial swelling, erythema, or exudate.   Neck: supple, no LAD.   LUNGS: CTA bilat, nonlabored resps.   ?Mild prolongation of exp phase.  Aeration is good. CV: RRR, no m/r/g. EXT: no c/c/e SKIN: no rash    IMPRESSION AND PLAN:  Acute bronchitis Viral etiology most likely.  No sign of bacterial infection.  No signif sign of RAD on exam, but I advised her to try her ProAir 2 puffs q4h prn for chest tightness and feeling of SOB, not JUST for wheezing. Continue current OTC symptomatic med.  Discussed warning signs of worsening illness and what to call or return for. F/u prn (1 wk if not any  better).  Note written to excuse her from work today and tomorrow.     FOLLOW UP:  Return if symptoms worsen or fail to improve.

## 2010-10-24 NOTE — Assessment & Plan Note (Signed)
Viral etiology most likely.  No sign of bacterial infection.  No signif sign of RAD on exam, but I advised her to try her ProAir 2 puffs q4h prn for chest tightness and feeling of SOB, not JUST for wheezing. Continue current OTC symptomatic med.  Discussed warning signs of worsening illness and what to call or return for. F/u prn (1 wk if not any better).  Note written to excuse her from work today and tomorrow.

## 2010-10-25 ENCOUNTER — Ambulatory Visit (INDEPENDENT_AMBULATORY_CARE_PROVIDER_SITE_OTHER): Payer: BC Managed Care – PPO | Admitting: Licensed Clinical Social Worker

## 2010-10-25 DIAGNOSIS — F411 Generalized anxiety disorder: Secondary | ICD-10-CM

## 2010-10-26 ENCOUNTER — Other Ambulatory Visit: Payer: Self-pay | Admitting: Family

## 2010-10-30 ENCOUNTER — Telehealth: Payer: Self-pay | Admitting: *Deleted

## 2010-10-30 ENCOUNTER — Other Ambulatory Visit: Payer: Self-pay | Admitting: Family

## 2010-10-30 NOTE — Telephone Encounter (Signed)
Spoke with pt, she states her cough is productive with yellow phlegm. Wants to know how long she should expect the productive cough to last. Reports that she has a zpack at home that she has never taken and wants to know if it would be ok to take that? Does not feel that symptoms have worsened and may have a slight improvement in chest congestion. Please advise.

## 2010-10-30 NOTE — Telephone Encounter (Signed)
Call placed to patient at [phone], no answer. A detailed voice message was left informing patient per [name] instructions.

## 2010-10-30 NOTE — Telephone Encounter (Signed)
Triage Call Report Triage Record Num: [id number] Operator: Albertine Grates Patient Name: Whitney Harmon Call Date & Time: 10/29/2010 5:51:45PM Patient Phone: [phone] PCP: [name], NP Patient Gender: Female PCP Fax : [phone] Patient DOB: 1975/06/29 Practice Name: [facility] Reason for Call: Was diagnosed with bacterial infection 5-31 and was told to treat symptoms. Continues to have cough 6-5 and is coughing green mucus. Afebrile. Wants to know why antibiotic was not prescribed. Advised follow up with office 6-6 per Cough protocol. Protocol(s) Used: Cough - Adult Recommended Outcome per Protocol: See Provider within 24 hours Reason for Outcome: Productive cough with colored sputum (other than clear or white sputum) Care Advice: ~ Use a cool mist humidifier to moisten air. Be sure to clean according to manufacturer's instructions. Increase fluids to 8-12 eight oz (1.6 to 2.4 liters) glasses per day, half of them to be water. Soups, popsicles, fruit juices, non-caffeinated sodas (unless restricting sodium intake), jello, broths, decaf teas, etc. are all okay. Warm fluids can be soothing. ~ 10/29/2010 6:02:00PM Page 1 of 1 [id number]

## 2010-10-30 NOTE — Telephone Encounter (Signed)
OK for her to start Zithromax- I do think that she should be re-evaluated in the office however.

## 2010-10-30 NOTE — Telephone Encounter (Signed)
Pt left voice message stating her symptoms are not worse but not resolved. Still has cough and chest congestion, congestion feels more loose and throat feels better. Pt states she has a Zpak at home and wants to know if it is ok to that. Left message for pt to return my call and let us know if she has a full course of Zpak and how many tablets are in the pack.

## 2010-11-01 ENCOUNTER — Encounter: Payer: Self-pay | Admitting: Family

## 2010-11-01 NOTE — Telephone Encounter (Signed)
Please advise if ok to refill med and # of refills.

## 2010-11-04 ENCOUNTER — Ambulatory Visit (INDEPENDENT_AMBULATORY_CARE_PROVIDER_SITE_OTHER): Payer: BC Managed Care – PPO | Admitting: Family

## 2010-11-04 DIAGNOSIS — R42 Dizziness and giddiness: Secondary | ICD-10-CM | POA: Insufficient documentation

## 2010-11-04 DIAGNOSIS — I10 Essential (primary) hypertension: Secondary | ICD-10-CM

## 2010-11-04 DIAGNOSIS — L500 Allergic urticaria: Secondary | ICD-10-CM

## 2010-11-04 MED ORDER — CLONIDINE HCL 0.1 MG PO TABS: 0.1000 mg | ORAL_TABLET | Freq: Two times a day (BID) | ORAL | Status: DC

## 2010-11-04 NOTE — Progress Notes (Signed)
Subjective:    Patient ID: Whitney Harmon, female    DOB: May 12, 1976, 35 y.o.   MRN: [id number]  HPI  Whitney Harmon is a 35 year old female who presents today with chief complaint of dizziness.    Dizziness- notes that she recently restarted the valtrex 4 days ago due to a genital herpes breakout and has been taking 2000mg  once daily.   Urticaria- saw Dr. Maple Hudson and he thought that the mirena could be contributing.  + ANA- saw Rheumatology- She tells me that they think that this may be med related due to Mirena.    Patient has been treated for Chronic HTN for quiet sometime. She is currently on  hydrochlorothiazide and norvasc and is poorly controlled. No associated S/S related to HTN.   Quality: chronic Modifying factor: meds Duration: Quite sometime Associated S/S: None.  The patient denies the following associated symptoms: Chest pain, dyspnea, blurred vision, headache, or lower extremity edema.    Review of Systems see HPI    Past Medical History  Diagnosis Date  . Hypertension   . Asthma     childhood  . Overweight     History   Social History  . Marital Status: Single    Spouse Name: N/A    Number of Children: 1  . 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 Smoking status: Never Smoker   . Smokeless tobacco: Never Used  . Alcohol Use: Yes     social, once a week  . Drug Use: No  . Sexually Active: Not on file   Other Topics Concern  . 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 Salpingoophorectomy     left    Family History  Problem Relation Age of Onset  . Arthritis Mother     osteoarthritis  . Diabetes Mother   . Hypertension Mother   . Mental illness Mother     schizophrenia  . Obesity Mother   . Schizophrenia Mother   . Hypertension Father   . Alcohol abuse    . Diabetes    . Hypertension      No  Known Allergies  Current Outpatient Prescriptions on File Prior to Visit  Medication Sig Dispense Refill  . albuterol (PROAIR HFA) 108 (90 BASE) MCG/ACT inhaler Inhale 2 puffs into the lungs every 6 (six) hours as needed. For wheezing       . ALPRAZolam (XANAX) 0.25 MG tablet Take 0.25 mg by mouth at bedtime as needed.        Marland Kitchen aluminum chloride (DRYSOL) 20 % external solution Apply topically daily.        Marland Kitchen amLODipine (NORVASC) 10 MG tablet Take 1 tablet (10 mg total) by mouth daily.  30 tablet  1  . benzoyl peroxide-erythromycin (BENZAMYCIN) gel Apply to affected area 2 times daily  47 g  0  . DULoxetine (CYMBALTA) 60 MG capsule Take 1 capsule (60 mg total) by mouth daily.  30 capsule  2  . EPINEPHrine (EPIPEN) 0.3 mg/0.3 mL DEVI Inject 0.3 mg into the muscle once. Inject into muscle if you develop severe hives, tongue, lip swelling or associated shortness of breath.       . hydrochlorothiazide 25 MG tablet TAKE 1 TABLET BY MOUTH DAILY  30 tablet  1  . levonorgestrel (  MIRENA) 20 MCG/24HR IUD 1 each by Intrauterine route once.        . loratadine (CLARITIN) 10 MG tablet Take 10 mg by mouth daily.        . sertraline (ZOLOFT) 100 MG tablet Take 100 mg by mouth daily.        . valACYclovir (VALTREX) 1000 MG tablet TAKE 1 TABLET BY MOUTH ONCE DAILY  30 tablet  0  . DISCONTD: phentermine 37.5 MG capsule Take 37.5 mg by mouth every morning.          BP 154/90  Temp(Src) 97.8 F (36.6 C) (Oral)  Wt 273 lb 0.6 oz (123.85 kg)    Objective:   Physical Exam  Constitutional: She is oriented to person, place, and time. She appears well-developed and well-nourished.  Cardiovascular: Normal rate and regular rhythm.   Pulmonary/Chest: Effort normal and breath sounds normal.  Musculoskeletal: She exhibits no edema.  Neurological: She is alert and oriented to person, place, and time.  Psychiatric: She has a normal mood and affect. Her behavior is normal. Judgment and thought content normal.            Assessment & Plan:

## 2010-11-04 NOTE — Assessment & Plan Note (Signed)
I suspect this is a side effect of valtrex +/- poorly controlled BP. Recommended that she stop the Valtrex for now and then she can restart suppress therapy dosing if she wishes after her dizziness resolves.

## 2010-11-04 NOTE — Assessment & Plan Note (Signed)
I instructed her to follow up with her GYN and discuss possibly switching from Mirena to the Paraguard.

## 2010-11-04 NOTE — Patient Instructions (Signed)
Follow up in 1 month   

## 2010-11-04 NOTE — Assessment & Plan Note (Signed)
Poorly controlled. I advised her against use of phentermine which she tells me that she is rarely using.  Will add clonidine.

## 2010-11-04 NOTE — Telephone Encounter (Signed)
Refill was sent on 10/30/10 by [name], NP

## 2010-11-08 ENCOUNTER — Ambulatory Visit (INDEPENDENT_AMBULATORY_CARE_PROVIDER_SITE_OTHER): Payer: BC Managed Care – PPO | Admitting: Licensed Clinical Social Worker

## 2010-11-08 DIAGNOSIS — F411 Generalized anxiety disorder: Secondary | ICD-10-CM

## 2010-12-02 ENCOUNTER — Ambulatory Visit: Payer: BC Managed Care – PPO | Admitting: Family

## 2010-12-02 ENCOUNTER — Ambulatory Visit: Payer: BC Managed Care – PPO | Admitting: Internal Medicine

## 2010-12-04 ENCOUNTER — Ambulatory Visit (INDEPENDENT_AMBULATORY_CARE_PROVIDER_SITE_OTHER): Payer: BC Managed Care – PPO | Admitting: Family

## 2010-12-04 ENCOUNTER — Encounter: Payer: Self-pay | Admitting: Family

## 2010-12-04 DIAGNOSIS — F411 Generalized anxiety disorder: Secondary | ICD-10-CM

## 2010-12-04 DIAGNOSIS — F329 Major depressive disorder, single episode, unspecified: Secondary | ICD-10-CM

## 2010-12-04 DIAGNOSIS — I10 Essential (primary) hypertension: Secondary | ICD-10-CM

## 2010-12-04 MED ORDER — VILAZODONE HCL 10 & 20 & 40 MG PO KIT: 1.0000 | PACK | Freq: Every day | ORAL | Status: DC

## 2010-12-04 NOTE — Assessment & Plan Note (Signed)
She reports that her anxiety is stable. She has not needed to fill her alprazolam prescription.

## 2010-12-04 NOTE — Progress Notes (Signed)
Subjective:    Patient ID: Whitney Harmon, female    DOB: [birth year], 35 y.o.   MRN: [id number]  HPI  [name] is a 35 yr old female who presents today for follow up.   HTN- Pt reports that she is tolerating the clonidine. She reports she is only been taking it once daily however.  Depression- patient feels like her depression has recently worsened. Notes spacenot wanting to get out of bed, sleeping more.  Reports no desire to get out of her chair meds or to do her makeup.Denies suicidal ideation. She continues Cymbalta but does not feel that it is working. She notes that she continues to gain weight. She has not been working out consistently. She reports that she continues he" doesn't care."   Review of Systems See history of present illness  Past Medical History  Diagnosis Date  . Hypertension   . Asthma     childhood  . Overweight     History   Social History  . Marital Status: Single    Spouse Name: N/A    Number of Children: 1  . 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 Smoking status: Never Smoker   . Smokeless tobacco: Never Used  . Alcohol Use: Yes     social, once a week  . Drug Use: No  . Sexually Active: Not on file   Other Topics Concern  . 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 Salpingoophorectomy     left    Family History  Problem Relation Age of Onset  . Arthritis Mother     osteoarthritis  . Diabetes Mother   . Hypertension Mother   . Mental illness Mother     schizophrenia  . Obesity Mother   . Schizophrenia Mother   . Hypertension Father   . Alcohol abuse    . Diabetes    . Hypertension      No Known Allergies  Current Outpatient Prescriptions on File Prior to Visit  Medication Sig Dispense Refill  . albuterol (PROAIR HFA) 108 (90 BASE) MCG/ACT inhaler Inhale 2 puffs into the lungs  every 6 (six) hours as needed. For wheezing       . aluminum chloride (DRYSOL) 20 % external solution Apply topically daily.        Marland Kitchen amLODipine (NORVASC) 10 MG tablet Take 1 tablet (10 mg total) by mouth daily.  30 tablet  1  . benzoyl peroxide-erythromycin (BENZAMYCIN) gel Apply to affected area 2 times daily  47 g  0  . cloNIDine (CATAPRES) 0.1 MG tablet Take 1 tablet (0.1 mg total) by mouth 2 (two) times daily.  60 tablet  1  . DULoxetine (CYMBALTA) 60 MG capsule Take 1 capsule (60 mg total) by mouth daily.  30 capsule  2  . EPINEPHrine (EPIPEN) 0.3 mg/0.3 mL DEVI Inject 0.3 mg into the muscle once. Inject into muscle if you develop severe hives, tongue, lip swelling or associated shortness of breath.       . hydrochlorothiazide 25 MG tablet TAKE 1 TABLET BY MOUTH DAILY  30 tablet  1  . levonorgestrel (MIRENA) 20 MCG/24HR IUD 1 each by Intrauterine route once.        . loratadine (CLARITIN) 10 MG tablet Take 10 mg by mouth daily.        Marland Kitchen  valACYclovir (VALTREX) 1000 MG tablet TAKE 1 TABLET BY MOUTH ONCE DAILY  30 tablet  0  . ALPRAZolam (XANAX) 0.25 MG tablet Take 0.25 mg by mouth at bedtime as needed.         BP 126/82  Pulse 66  Temp(Src) 98.2 F (36.8 C) (Oral)  Resp 16  Ht 5' 6.5" (1.689 m)  Wt 283 lb (128.368 kg)  BMI 45.00 kg/m2  LMP 11/20/2010       Objective:   Physical Exam  Constitutional: She appears well-developed and well-nourished.  Psychiatric:       Pleasant and appropriate. Affect somewhat more flat than usual for her. No tearfulness is noted.          Assessment & Plan:  [id number] exp 04/2012  15 minutes spent with the patient stayed nearly all this time was spent counseling her on her depression.

## 2010-12-04 NOTE — Assessment & Plan Note (Signed)
Blood pressure is improved. I have instructed her to increase her dose to twice daily on the clonidine. We'll repeat one month.

## 2010-12-04 NOTE — Patient Instructions (Addendum)
Call if your depression symptoms worsen. Go to the ER if you develop suicidal thoughts. Follow up in 1 month.

## 2010-12-04 NOTE — Assessment & Plan Note (Signed)
Deteriorated-will transition her to buy bread. We discussed adverse side effects such as transient diarrhea. She denies current suicidal ideation. She is instructed to go to the ER should she develop suicidal ideation. She was given a sample starter pack for Viibrd and it is instructed to followup in one month. If her symptoms worsen or if she has not shown significant improvement at this time we'll plan referral to psychiatry.

## 2010-12-06 ENCOUNTER — Telehealth: Payer: Self-pay | Admitting: *Deleted

## 2010-12-06 NOTE — Telephone Encounter (Signed)
Received message from pt stating she has taken 2 doses of Vybrid and is experiencing head ache throughout the night. Has dull headache today with severe dizziness and nausea. Pt states she also had 2 glasses of wine with dinner last night and read on the medication precautions that it says not to use alcohol while taking medication. Pt wants to know if this is coming from the medication and what should she do?

## 2010-12-06 NOTE — Telephone Encounter (Signed)
Spoke with patient, she says that she has been laying down. Thinks that she may be feeling a little better.  Pt will continue viibryd and call me on Monday if her side effects are not improved.

## 2010-12-09 ENCOUNTER — Telehealth: Payer: Self-pay | Admitting: *Deleted

## 2010-12-09 MED ORDER — MECLIZINE HCL 25 MG PO TABS: 25.0000 mg | ORAL_TABLET | Freq: Three times a day (TID) | ORAL | Status: AC | PRN

## 2010-12-09 NOTE — Telephone Encounter (Signed)
She can try meclizine tonight. Rx sent to her pharmacy. Keep f/u apt tomorrow please.

## 2010-12-09 NOTE — Telephone Encounter (Signed)
Received message from pt that she has not taken Vybrid since Friday.  Still has nausea, dizziness and diarrhea. States her symptoms worsened over the weekend and she had to stay in the bed. Pt reports only 1 major diarrhea episode today. States she is feeling a vibrating sensation in her head and has read this could be a side effect of the medication.  Advised pt she should be seen in the office. Appt scheduled for 8am tomorrow. Pt wants to know what she can take for the dizziness. Please advise.

## 2010-12-09 NOTE — Telephone Encounter (Signed)
Pt notified and voices understanding. 

## 2010-12-10 ENCOUNTER — Telehealth: Payer: Self-pay | Admitting: *Deleted

## 2010-12-10 ENCOUNTER — Ambulatory Visit: Payer: BC Managed Care – PPO | Admitting: Family

## 2010-12-10 DIAGNOSIS — Z0289 Encounter for other administrative examinations: Secondary | ICD-10-CM

## 2010-12-10 NOTE — Telephone Encounter (Signed)
Left message on machine to return my call. 

## 2010-12-10 NOTE — Telephone Encounter (Signed)
I recommend that she stop viibryd, restart cymbalta until she can be seen in the office for follow up.

## 2010-12-10 NOTE — Telephone Encounter (Signed)
Pt left voice message that she was unable to make appointment today because she was not able to find a driver. She reports that the diarrhea has improved, dizziness is some better. She states that she has done some research and found that her symptoms are similar to others that have experienced withdrawal from Cymbalta and side effects of Vybrid. Pt would like to know your thoughts? Also wants to know if she still needs to follow up?

## 2010-12-12 NOTE — Telephone Encounter (Signed)
Left message for pt to return my call.

## 2010-12-16 NOTE — Telephone Encounter (Signed)
Left message on machine to return my call. 

## 2010-12-16 NOTE — Telephone Encounter (Signed)
Pt states she does not have any more Cymbalta as she discarded it. Pt is afraid to restart any medications in that class. Pt states her head sensations and dizziness are resolving.  Pt states she is still using xanax as needed for sleep. Feels that her symptoms were coming from Cymbalta withdrawal.  She has started working out and is feeling good. She states she is going to see her therapist next week. Pt has appt with [place] next month and wants to know if you need to see her before that time? Please advise.

## 2010-12-16 NOTE — Telephone Encounter (Signed)
If she is feeling well, ok to follow up as scheduled.

## 2010-12-16 NOTE — Telephone Encounter (Signed)
Left detailed message on pt's phone and to call if any questions.

## 2010-12-21 ENCOUNTER — Other Ambulatory Visit: Payer: Self-pay | Admitting: Family

## 2010-12-26 ENCOUNTER — Ambulatory Visit: Payer: BC Managed Care – PPO | Admitting: Internal Medicine

## 2010-12-27 ENCOUNTER — Ambulatory Visit (INDEPENDENT_AMBULATORY_CARE_PROVIDER_SITE_OTHER): Payer: BC Managed Care – PPO | Admitting: Licensed Clinical Social Worker

## 2010-12-27 DIAGNOSIS — F411 Generalized anxiety disorder: Secondary | ICD-10-CM

## 2011-01-08 ENCOUNTER — Ambulatory Visit: Payer: BC Managed Care – PPO | Admitting: Family

## 2011-01-10 ENCOUNTER — Ambulatory Visit: Payer: BC Managed Care – PPO | Admitting: Licensed Clinical Social Worker

## 2011-01-14 ENCOUNTER — Ambulatory Visit: Payer: BC Managed Care – PPO | Admitting: Internal Medicine

## 2011-01-16 ENCOUNTER — Ambulatory Visit: Payer: BC Managed Care – PPO | Admitting: Internal Medicine

## 2011-01-22 ENCOUNTER — Other Ambulatory Visit: Payer: Self-pay | Admitting: Family

## 2011-01-22 NOTE — Telephone Encounter (Signed)
Amlodipine 10mg  1 tablet daily #30 x no refills sent to [facility].

## 2011-01-26 ENCOUNTER — Other Ambulatory Visit: Payer: Self-pay | Admitting: Family

## 2011-02-05 ENCOUNTER — Encounter: Payer: Self-pay | Admitting: Family

## 2011-02-05 ENCOUNTER — Ambulatory Visit (INDEPENDENT_AMBULATORY_CARE_PROVIDER_SITE_OTHER): Payer: BC Managed Care – PPO | Admitting: Family

## 2011-02-05 DIAGNOSIS — F3289 Other specified depressive episodes: Secondary | ICD-10-CM

## 2011-02-05 DIAGNOSIS — F329 Major depressive disorder, single episode, unspecified: Secondary | ICD-10-CM

## 2011-02-05 DIAGNOSIS — L732 Hidradenitis suppurativa: Secondary | ICD-10-CM

## 2011-02-05 MED ORDER — CEPHALEXIN 500 MG PO CAPS: 500.0000 mg | ORAL_CAPSULE | Freq: Four times a day (QID) | ORAL | Status: AC

## 2011-02-05 NOTE — Patient Instructions (Signed)
You will be contacted about your referral to Dermatology. Follow up in 3 months- sooner if worsening depression/anxiety.
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
## 2011-02-06 DIAGNOSIS — L732 Hidradenitis suppurativa: Secondary | ICD-10-CM | POA: Insufficient documentation

## 2011-02-06 NOTE — Assessment & Plan Note (Signed)
This is improved, currently off of meds. Monitor.

## 2011-02-06 NOTE — Assessment & Plan Note (Signed)
This is a chronic/recurrent issue for her.  Will defer to dermatology.  Rx provided for keflex if needed.

## 2011-02-22 ENCOUNTER — Other Ambulatory Visit: Payer: Self-pay | Admitting: Family

## 2011-02-25 ENCOUNTER — Other Ambulatory Visit: Payer: Self-pay | Admitting: Family

## 2011-02-28 ENCOUNTER — Ambulatory Visit: Payer: BC Managed Care – PPO | Admitting: Internal Medicine

## 2011-04-04 ENCOUNTER — Ambulatory Visit: Payer: BC Managed Care – PPO | Admitting: Internal Medicine

## 2011-04-14 ENCOUNTER — Telehealth: Payer: Self-pay | Admitting: *Deleted

## 2011-04-14 NOTE — Telephone Encounter (Signed)
Received message from pt that she is having increased stress at work and has been taking xanax QHS but doesn't feel this is sufficient. Pt thinks she may need some time off of work. Spoke to pt, she states she has been seeing a therapist through [facility] and has an appt with him tomorrow. Advised pt to discuss concern with therapist. Pt notified that she will need an appt with [name], NP if letter will be needed from PCP for work leave. Pt voices understanding.

## 2011-04-26 ENCOUNTER — Other Ambulatory Visit: Payer: Self-pay | Admitting: Family

## 2011-04-28 NOTE — Telephone Encounter (Signed)
Rx refill sent to pharmacy. 

## 2011-04-30 ENCOUNTER — Other Ambulatory Visit: Payer: Self-pay | Admitting: Family

## 2011-05-08 ENCOUNTER — Encounter: Payer: Self-pay | Admitting: Internal Medicine

## 2011-05-08 ENCOUNTER — Encounter: Payer: Self-pay | Admitting: *Deleted

## 2011-05-08 ENCOUNTER — Ambulatory Visit (INDEPENDENT_AMBULATORY_CARE_PROVIDER_SITE_OTHER): Payer: BC Managed Care – PPO | Admitting: Internal Medicine

## 2011-05-08 VITALS — BP 140/78 | HR 80 | Ht 66.5 in | Wt 270.0 lb

## 2011-05-08 DIAGNOSIS — L500 Allergic urticaria: Secondary | ICD-10-CM

## 2011-05-08 NOTE — Progress Notes (Signed)
Subjective:    Patient ID: Whitney Harmon, female    DOB: November 29, 1975, 35 y.o.   MRN: [id number]  HPI 10/11/10- 70 yoF never smoker seen at request of M. Peggyann Juba, NP for allergy evaluation because of hives. She had hives once as a child, treated with an injection. The current problem began 3 years ago while living in [place]. Onset was usually in the evenings. Benadryl would help but made her sleepy.Worse if stressed or after eating. Pattern persisted after moving to [place] 2 years ago. 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 Food IgE 204.6 on 07/15/10.  Now she takes daily Claritin, often supplemented with benadryl. Avoids suspect foods. Meds have been changed many times without effect. Hives still occur daily or every other day.  Asthma as a child, with hospitalization. Allergic rhinitis stopped as a teen.  Has been on Minerva hormone eluting IUD x 3 years but not sure whether hives came first.   05/08/11- 35 yoF followed for urticaria She declines flu vaccine. 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 She still notices occasional hives with stress and says her life is full stress. Her IUD was removed 4 months ago. She is not on hormones now, using condoms.  ANA 10/11/2010-1:640. She saw a rheumatologist who did more labwork and apparently doubts lupus. She has no more followup scheduled. Being treated for depression. Lab: Sedimentation rate was normal-14, TSH normal-0.81, liver functions normal. Food allergy profile 07/15/10- mild elevations for peanut and apple. Allergy profile- Total IgE 201.8, multiple elevations for common inhalant allergens.    Review of Systems-see HPI Constitutional:   No-   weight loss, night sweats, fevers, chills, fatigue, lassitude. HEENT:   No-  headaches, difficulty swallowing, tooth/dental problems, sore throat,       No-  sneezing, itching, ear ache, nasal congestion, post nasal drip,  CV:   No-   chest pain, orthopnea, PND, swelling in lower extremities, anasarca,                                  dizziness, palpitations Resp: No-   shortness of breath with exertion or at rest.              No-   productive cough,  + non-productive cough,  No- coughing up of blood.              No-   change in color of mucus.  No- wheezing.   Skin: Hives per HPI GI:  No-   heartburn, indigestion, abdominal pain, nausea, vomiting, diarrhea,                 change in bowel habits, loss of appetite GU: No-   dysuria, change in color of urine, no urgency or frequency.  No- flank pain. MS:  No-   joint pain or swelling.  No- decreased range of motion.  No- back pain. Neuro-     nothing unusual Psych:  No- change in mood or affect. + depression or anxiety.  No memory loss.       Objective:   Physical Exam General- Alert, Oriented, Affect-appropriate, Distress- none acute, overweight, smiles easily. Skin- rash-none, lesions- none, excoriation- none Lymphadenopathy- none Head- atraumatic            Eyes- Gross vision intact, PERRLA, conjunctivae clear secretions  Ears- Hearing, canals-normal            Nose- Clear, no-Septal dev, mucus, polyps, erosion, perforation             Throat- Mallampati II , mucosa clear , drainage- none, tonsils- atrophic Neck- flexible , trachea midline, no stridor , thyroid nl, carotid no bruit Chest - symmetrical excursion , unlabored           Heart/CV- RRR , no murmur , no gallop  , no rub, nl s1 s2                           - JVD- none , edema- none, stasis changes- none, varices- none           Lung- clear to P&A, wheeze- none, cough- none , dullness-none, rub- none           Chest wall-  Abd- tender-no, distended-no, bowel sounds-present, HSM- no Br/ Gen/ Rectal- Not done, not indicated Extrem- cyanosis- none, clubbing, none, atrophy- none, strength- nl Neuro- grossly intact to observation

## 2011-05-08 NOTE — Patient Instructions (Addendum)
Try using an otc, nonsedating antihistamine like Claritin/ loratadine or Allegra/ fexofenadine  You can make your own decisions about re-trying the IUD  Pay attention to what you eat ,maybe keep a food diary. Only avoid those foods that clearly cause you symptoms.   Ask Ms Peggyann Juba to look again at that elevated ANA in the future if you have a concern

## 2011-05-09 ENCOUNTER — Ambulatory Visit: Payer: BC Managed Care – PPO | Admitting: Family

## 2011-05-11 NOTE — Assessment & Plan Note (Signed)
We have not proven that hormones released by her IUD were contributing to incidence of hives, but she did improve after that was removed. She has significant IgE levels and may very well developed allergic rhinitis and other manifestations. She is advised to avoid peanuts and apple. I suggested she use a food diary so that she could track symptoms against foods consumed. She can pretreat with antihistamines before going out to eat where she can't control the foods she eats. I defer to her rheumatologist for interpretation of ANA 1:640, especially since sedimentation rate was only 14.

## 2011-05-12 ENCOUNTER — Encounter: Payer: Self-pay | Admitting: Family

## 2011-05-12 ENCOUNTER — Ambulatory Visit (INDEPENDENT_AMBULATORY_CARE_PROVIDER_SITE_OTHER): Payer: BC Managed Care – PPO | Admitting: Family

## 2011-05-12 VITALS — BP 148/80 | HR 78 | Temp 98.1°F | Resp 16 | Wt 259.0 lb

## 2011-05-12 DIAGNOSIS — R768 Other specified abnormal immunological findings in serum: Secondary | ICD-10-CM

## 2011-05-12 DIAGNOSIS — L500 Allergic urticaria: Secondary | ICD-10-CM

## 2011-05-12 DIAGNOSIS — J069 Acute upper respiratory infection, unspecified: Secondary | ICD-10-CM | POA: Insufficient documentation

## 2011-05-12 DIAGNOSIS — I10 Essential (primary) hypertension: Secondary | ICD-10-CM

## 2011-05-12 DIAGNOSIS — R894 Abnormal immunological findings in specimens from other organs, systems and tissues: Secondary | ICD-10-CM

## 2011-05-12 HISTORY — DX: Other specified abnormal immunological findings in serum: R76.8

## 2011-05-12 MED ORDER — MOMETASONE FUROATE 50 MCG/ACT NA SUSP: 2.0000 | Freq: Every day | NASAL | Status: DC

## 2011-05-12 NOTE — Progress Notes (Signed)
Subjective:    Patient ID: Whitney Harmon, female    DOB: 09/19/1975, 35 y.o.   MRN: [id number]  HPI  HTN-  She reports that she has been forgetting to take the PM dose of her clonidine since she ahs been sick.   URI- she reports that on Thursday she developed chest tightness on Thursday 12/14.  She started mucinex.  On Friday, she started feeling worse, was developing some "head congestion." Felt feverish on Friday with chills. Saturday similar, but she started with a productive cough.  Now has moved from her chest into her nasal area.  Saturday she started airborn, coriciden cold/flu and mucinex.  She reports that she continues to be bothered by her nasal congestion.    Urticaria- she reports that since she has been sick her hives have been, "bad."  She was seen by Dr. Dierdre Forth re: elevated ANA and Dr. Maple Hudson.  Dr. Maple Hudson has recommended avoidence of apple and peanut.     Review of Systems See HPI  Past Medical History  Diagnosis Date  . Hypertension   . Asthma     childhood  . Overweight   . Elevated antinuclear antibody (ANA) level 05/12/2011    History   Social History  . Marital Status: Single    Spouse Name: N/A    Number of Children: 1  . 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 Smoking status: Never Smoker   . Smokeless tobacco: Never Used  . Alcohol Use: Yes     social, once a week  . Drug Use: No  . Sexually Active: Not on file   Other Topics Concern  . 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 Salpingoophorectomy     left    Family History  Problem Relation Age of Onset  . Arthritis Mother     osteoarthritis  . Diabetes Mother   . Hypertension Mother   . Mental illness Mother     schizophrenia  . Obesity Mother   . Schizophrenia Mother   . Hypertension Father   . Alcohol abuse    . Diabetes    . Hypertension       Allergies  Allergen Reactions  . Viibryd Diarrhea    Current Outpatient Prescriptions on File Prior to Visit  Medication Sig Dispense Refill  . albuterol (PROAIR HFA) 108 (90 BASE) MCG/ACT inhaler Inhale 2 puffs into the lungs every 6 (six) hours as needed. For wheezing       . ALPRAZolam (XANAX) 0.25 MG tablet Take 0.25 mg by mouth at bedtime as needed.       Marland Kitchen amLODipine (NORVASC) 10 MG tablet TAKE ONE TABLET BY MOUTH DAILY  30 tablet  1  . cloNIDine (CATAPRES) 0.1 MG tablet TAKE 1 TABLET BY MOUTH TWICE DAILY  60 tablet  2  . EPINEPHrine (EPIPEN) 0.3 mg/0.3 mL DEVI Inject 0.3 mg into the muscle once. Inject into muscle if you develop severe hives, tongue, lip swelling or associated shortness of breath.       . hydrochlorothiazide (HYDRODIURIL) 25 MG tablet TAKE 1 TABLET BY MOUTH DAILY  30 tablet  0  . loratadine (CLARITIN) 10 MG tablet Take 10 mg by mouth daily.        . meclizine (ANTIVERT) 25 MG tablet Take 1 tablet (  25 mg total) by mouth 3 (three) times daily as needed for dizziness or nausea.  10 tablet  0    BP 148/80  Pulse 78  Temp(Src) 98.1 F (36.7 C) (Oral)  Resp 16  Wt 259 lb (117.482 kg)  SpO2 97%       Objective:   Physical Exam  Constitutional: She appears well-developed and well-nourished. No distress.  HENT:  Head: Normocephalic and atraumatic.  Right Ear: Tympanic membrane and ear canal normal.  Left Ear: Tympanic membrane and ear canal normal.  Mouth/Throat: Mucous membranes are normal. No posterior oropharyngeal edema or posterior oropharyngeal erythema.  Cardiovascular: Normal rate.   No murmur heard. Pulmonary/Chest: Effort normal and breath sounds normal. No respiratory distress. She has no wheezes. She has no rales. She exhibits no tenderness.  Skin: Skin is warm and dry.       Some irritation noted on bilateral arms from excoriation, but no hives.    Psychiatric: She has a normal mood and affect. Her behavior is normal. Judgment and thought  content normal.          Assessment & Plan:   BP Readings from Last 3 Encounters:  05/12/11 148/80  05/08/11 140/78  02/05/11 128/78

## 2011-05-12 NOTE — Assessment & Plan Note (Signed)
Unchanged, this is being managed by Dr. Maple Hudson. She is on daily claritin.

## 2011-05-12 NOTE — Assessment & Plan Note (Signed)
She tells me that she never heard back from Dr. Shawnee Knapp office re: lab results. Will request lab results.

## 2011-05-12 NOTE — Assessment & Plan Note (Signed)
Deteriorated. Pt to resume HS dose of clonidine.

## 2011-05-12 NOTE — Assessment & Plan Note (Signed)
Continue supportive measures. Add Nasonex for short term. She will contact us if her symptoms are not improved in 2-3 days.

## 2011-05-13 ENCOUNTER — Encounter: Payer: Self-pay | Admitting: Family

## 2011-05-13 LAB — BASIC METABOLIC PANEL
Calcium: 9 mg/dL (ref 8.4–10.5)
Creat: 0.72 mg/dL (ref 0.50–1.10)
Sodium: 139 mEq/L (ref 135–145)

## 2011-05-16 ENCOUNTER — Ambulatory Visit: Payer: BC Managed Care – PPO | Admitting: Family

## 2011-05-23 ENCOUNTER — Ambulatory Visit: Payer: Self-pay

## 2011-05-27 ENCOUNTER — Other Ambulatory Visit: Payer: Self-pay | Admitting: Family

## 2011-06-02 ENCOUNTER — Other Ambulatory Visit: Payer: Self-pay | Admitting: Family

## 2011-06-03 NOTE — Telephone Encounter (Signed)
OK to send #30 no refills.  

## 2011-06-03 NOTE — Telephone Encounter (Signed)
Please advise re: refill. Last refill that we sent was 07/15/10.

## 2011-06-05 ENCOUNTER — Telehealth: Payer: Self-pay | Admitting: *Deleted

## 2011-06-05 NOTE — Telephone Encounter (Signed)
Records received from Dr Mallie Mussel, rheumatology and forwarded to Provider for review.

## 2011-06-05 NOTE — Telephone Encounter (Signed)
Refill left on pharmacy voicemail. 

## 2011-06-10 ENCOUNTER — Other Ambulatory Visit: Payer: Self-pay | Admitting: Family

## 2011-06-12 ENCOUNTER — Telehealth: Payer: Self-pay | Admitting: *Deleted

## 2011-06-12 NOTE — Telephone Encounter (Signed)
Received message from pt that she has had head congestion with yellow nasal discharge, sore throat and sinus drainage since Friday. Wants to know if she should try something otc? Also requests to be written out of work for a couple of weeks due to stress and anxiety. Attempted to reach pt and left message to return my call.

## 2011-06-13 NOTE — Telephone Encounter (Signed)
Left detailed message for pt to call and schedule an appt. To discuss both concerns.

## 2011-06-16 ENCOUNTER — Ambulatory Visit (INDEPENDENT_AMBULATORY_CARE_PROVIDER_SITE_OTHER): Payer: BC Managed Care – PPO | Admitting: Family

## 2011-06-16 ENCOUNTER — Other Ambulatory Visit: Payer: Self-pay | Admitting: Family

## 2011-06-16 ENCOUNTER — Encounter: Payer: Self-pay | Admitting: Family

## 2011-06-16 DIAGNOSIS — F419 Anxiety disorder, unspecified: Secondary | ICD-10-CM

## 2011-06-16 DIAGNOSIS — J329 Chronic sinusitis, unspecified: Secondary | ICD-10-CM

## 2011-06-16 DIAGNOSIS — F411 Generalized anxiety disorder: Secondary | ICD-10-CM

## 2011-06-16 MED ORDER — AMOXICILLIN-POT CLAVULANATE 875-125 MG PO TABS: 1.0000 | ORAL_TABLET | Freq: Two times a day (BID) | ORAL | Status: AC
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
## 2011-06-16 NOTE — Assessment & Plan Note (Signed)
Will plan to treat with augmentin. 

## 2011-06-16 NOTE — Assessment & Plan Note (Signed)
Deteriorated and largely situational.  Hopefully, things will improve as soon as she is out of this difficult work situation.  In the meantime, I recommended that she use the xanax prn.  I hope to avoid putting her on a maintenance med if we can avoid it.  Will re-evaluate in 1 month.

## 2011-06-16 NOTE — Patient Instructions (Signed)
Follow up in 1 month, sooner if problems or concerns.  

## 2011-06-16 NOTE — Assessment & Plan Note (Deleted)
Deteriorated and largely situational.  Hopefully, things will improve as soon as she is out of this difficult work situation.  In the meantime, I recommended that she use the xanax prn.  I hope to avoid putting her on a maintenance med if we can avoid it.  Will re-evaluate in 1 month.   

## 2011-06-29 ENCOUNTER — Other Ambulatory Visit: Payer: Self-pay | Admitting: Family

## 2011-07-14 ENCOUNTER — Other Ambulatory Visit: Payer: Self-pay | Admitting: Family

## 2011-07-23 ENCOUNTER — Other Ambulatory Visit: Payer: Self-pay | Admitting: Family

## 2011-07-23 NOTE — Telephone Encounter (Signed)
Refill left on pharmacy voicemail for Alprazolam 0.25mg  #30 x no refills.

## 2011-08-11 ENCOUNTER — Ambulatory Visit: Payer: BC Managed Care – PPO | Admitting: Family

## 2011-08-14 ENCOUNTER — Other Ambulatory Visit: Payer: Self-pay | Admitting: Family

## 2011-09-03 ENCOUNTER — Other Ambulatory Visit: Payer: Self-pay | Admitting: Family

## 2011-09-12 ENCOUNTER — Ambulatory Visit (INDEPENDENT_AMBULATORY_CARE_PROVIDER_SITE_OTHER): Payer: BC Managed Care – PPO | Admitting: Family

## 2011-09-12 ENCOUNTER — Encounter: Payer: Self-pay | Admitting: Family

## 2011-09-12 VITALS — BP 132/78 | HR 73 | Temp 98.3°F | Resp 16 | Ht 66.5 in | Wt 267.1 lb

## 2011-09-12 DIAGNOSIS — F411 Generalized anxiety disorder: Secondary | ICD-10-CM

## 2011-09-12 MED ORDER — CITALOPRAM HYDROBROMIDE 20 MG PO TABS: 20.0000 mg | ORAL_TABLET | Freq: Every day | ORAL | Status: DC

## 2011-09-12 NOTE — Progress Notes (Signed)
Subjective:    Patient ID: Whitney Harmon, female    DOB: [birth year], 36 y.o.   MRN: [id number]  HPI  Ms.  Stege is a 36 yr old female who presents today to discuss anxiety.  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  She tells me that she is still seeing a therapist at [facility].  Finds herself anxious. She finds herself binge eating to sooth herself.  She has gained 10 pounds in 2 weeks. Often obsesses over things.  .   Review of Systems See HPI  Past Medical History  Diagnosis Date  . Hypertension   . Asthma     childhood  . Overweight   . Elevated antinuclear antibody (ANA) level 05/12/2011    History   Social History  . Marital Status: Single    Spouse Name: N/A    Number of Children: 1  . 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 Smoking status: Never Smoker   . Smokeless tobacco: Never Used  . Alcohol Use: Yes     social, once a week  . Drug Use: No  . Sexually Active: Not on file   Other Topics Concern  . 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 Salpingoophorectomy     left    Family History  Problem Relation Age of Onset  . Arthritis Mother     osteoarthritis  . Diabetes Mother   . Hypertension Mother   . Mental illness Mother     schizophrenia  . Obesity Mother   . Schizophrenia Mother   . Hypertension Father   . Alcohol abuse    . Diabetes    . Hypertension      Allergies  Allergen Reactions  . Vilazodone Hcl Diarrhea    Current Outpatient Prescriptions on File Prior to Visit  Medication Sig Dispense Refill  . albuterol (PROAIR HFA) 108 (90 BASE) MCG/ACT inhaler Inhale 2 puffs into the lungs every 6 (six) hours as needed. For wheezing       . ALPRAZolam (XANAX) 0.25 MG tablet TAKE 1 TABLET BY MOUTH EVERY NIGHT AT BEDTIME  30  tablet  0  . amLODipine (NORVASC) 10 MG tablet TAKE ONE TABLET BY MOUTH DAILY  30 tablet  2  . cloNIDine (CATAPRES) 0.1 MG tablet TAKE 1 TABLET BY MOUTH TWICE DAILY  60 tablet  2  . EPINEPHrine (EPIPEN) 0.3 mg/0.3 mL DEVI Inject 0.3 mg into the muscle once. Inject into muscle if you develop severe hives, tongue, lip swelling or associated shortness of breath.       . hydrochlorothiazide (HYDRODIURIL) 25 MG tablet TAKE 1 TABLET BY MOUTH DAILY  30 tablet  1  . meclizine (ANTIVERT) 25 MG tablet Take 1 tablet (25 mg total) by mouth 3 (three) times daily as needed for dizziness or nausea.  10 tablet  0  . mometasone (NASONEX) 50 MCG/ACT nasal spray Place 2 sprays into the nose daily.  7.5 g  0  . valACYclovir (VALTREX) 1000 MG tablet TAKE 1 TABLET BY MOUTH EVERY DAY AS NEEDED  30 tablet  1  . citalopram (CELEXA) 20 MG tablet Take 1 tablet (20 mg total) by mouth daily.  30 tablet  1  .  GuaiFENesin (MUCINEX PO) Take 1 tablet by mouth 2 (two) times daily.        Marland Kitchen loratadine (CLARITIN) 10 MG tablet Take 10 mg by mouth daily.          BP 132/78  Pulse 73  Temp(Src) 98.3 F (36.8 C) (Oral)  Resp 16  Ht 5' 6.5" (1.689 m)  Wt 267 lb 1.3 oz (121.147 kg)  BMI 42.47 kg/m2  SpO2 98%       Objective:   Physical Exam  Constitutional: She appears well-developed and well-nourished. No distress.  Psychiatric: She has a normal mood and affect. Her behavior is normal. Judgment and thought content normal.          Assessment & Plan:

## 2011-09-12 NOTE — Patient Instructions (Addendum)
Take xanax in the morning.   Citalopram 1/2 tablet once daily for 1 week and then increase to a full tablet once daily on week two as tolerated.  Follow up in 1 months.

## 2011-09-15 ENCOUNTER — Telehealth: Payer: Self-pay | Admitting: *Deleted

## 2011-09-15 MED ORDER — ALPRAZOLAM 0.25 MG PO TABS: 0.2500 mg | ORAL_TABLET | ORAL | Status: DC

## 2011-09-15 NOTE — Telephone Encounter (Signed)
Pt aware.

## 2011-09-15 NOTE — Assessment & Plan Note (Addendum)
Deteriorated.  She has been taking xanax at bedtime daily.  Recommended that she try switching that dose to AM. Will also give trial of citalopram  I instructed pt to start 1/2 tablet once daily for 1 week and then increase to a full tablet once daily on week two as tolerated.  We discussed common side effects such as nausea, drowsiness and weight gain.  Also discussed rare but serious side effect of suicide ideation.  She is instructed to discontinue medication go directly to ED if this occurs.  Pt verbalizes understanding.  Plan follow up in 1 month to evaluate progress.

## 2011-09-15 NOTE — Telephone Encounter (Signed)
Received call from pt requesting clarification if she was to continue alprazolam with citalopram. Advised pt per 09/12/11 office note that Melissa recommended she take alprazolam every morning and start citalopram. Pt states she is out of alprazolam and needs refill. Rx called to [facility] voicemail #30 x no refills.

## 2011-09-15 NOTE — Telephone Encounter (Signed)
OK to take alprazolam and citalopram together.

## 2011-10-13 ENCOUNTER — Ambulatory Visit: Payer: BC Managed Care – PPO | Admitting: Family

## 2011-10-14 ENCOUNTER — Other Ambulatory Visit: Payer: Self-pay | Admitting: Family

## 2011-10-15 NOTE — Telephone Encounter (Signed)
Alprazolam 0.25mg  1 tablet every morning #30 x no refills left on pharmacy voicemail.

## 2011-10-23 ENCOUNTER — Other Ambulatory Visit: Payer: Self-pay | Admitting: Family

## 2011-10-23 NOTE — Telephone Encounter (Signed)
Refill sent to pharmacy.   

## 2011-11-04 ENCOUNTER — Ambulatory Visit: Payer: BC Managed Care – PPO | Admitting: Family

## 2011-11-05 ENCOUNTER — Encounter: Payer: Self-pay | Admitting: Family

## 2011-11-05 ENCOUNTER — Ambulatory Visit (INDEPENDENT_AMBULATORY_CARE_PROVIDER_SITE_OTHER): Payer: BC Managed Care – PPO | Admitting: Family

## 2011-11-05 VITALS — BP 128/88 | HR 65 | Temp 98.3°F | Resp 16 | Ht 66.5 in | Wt 264.1 lb

## 2011-11-05 DIAGNOSIS — F411 Generalized anxiety disorder: Secondary | ICD-10-CM

## 2011-11-05 MED ORDER — FLUOXETINE HCL 20 MG PO TABS: 20.0000 mg | ORAL_TABLET | Freq: Every day | ORAL | Status: DC

## 2011-11-05 NOTE — Patient Instructions (Addendum)
Please schedule a follow up appointment in 2 months.

## 2011-11-05 NOTE — Progress Notes (Signed)
Subjective:    Patient ID: Whitney Harmon, female    DOB: 04/30/1976, 36 y.o.   MRN: [id number]  HPI  Whitney Harmon is a 36 yr old female who presents today for follow up of her anxiety.  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  She notes that in speaking with her clients she has has begun to revisit some of her own personal challenges.  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  She also is reminded of her childhood which she describes as neglectful.  Her mother has a hx of drug abuse and schizophrenia.     Since starting citalopram she reports that her obsessive thoughts have calmed down.  She does report feelings of sleepiness during the day.  She switched back to the evening for her alprazolam dose as she was not sleeping well without it.     Review of Systems See HPI Past Medical History  Diagnosis Date  . Hypertension   . Asthma     childhood  . Overweight   . Elevated antinuclear antibody (ANA) level 05/12/2011    History   Social History  . Marital Status: Single    Spouse Name: N/A    Number of Children: 1  . 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 Smoking status: Never Smoker   . Smokeless tobacco: Never Used  . Alcohol Use: Yes     social, once a week  . Drug Use: No  . Sexually Active: Not on file   Other Topics Concern  . 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 Salpingoophorectomy     left    Family History  Problem Relation Age of Onset  . Arthritis Mother     osteoarthritis  . Diabetes Mother   . Hypertension Mother   . Mental illness Mother     schizophrenia  . Obesity Mother   . Schizophrenia Mother   . Hypertension Father   . Alcohol abuse    .  Diabetes    . Hypertension      Allergies  Allergen Reactions  . Vilazodone Hcl Diarrhea    Current Outpatient Prescriptions on File Prior to Visit  Medication Sig Dispense Refill  . albuterol (PROAIR HFA) 108 (90 BASE) MCG/ACT inhaler Inhale 2 puffs into the lungs every 6 (six) hours as needed. For wheezing       . amLODipine (NORVASC) 10 MG tablet TAKE ONE TABLET BY MOUTH DAILY  30 tablet  2  . cloNIDine (CATAPRES) 0.1 MG tablet TAKE 1 TABLET BY MOUTH TWICE DAILY  60 tablet  3  . doxycycline (VIBRAMYCIN) 100 MG capsule Take 100 mg by mouth daily.      Marland Kitchen EPINEPHrine (EPIPEN) 0.3 mg/0.3 mL DEVI Inject 0.3 mg into the muscle once. Inject into muscle if you develop severe hives, tongue, lip swelling or associated shortness of breath.       . hydrochlorothiazide (HYDRODIURIL) 25 MG tablet TAKE 1 TABLET BY MOUTH DAILY  30 tablet  3  . meclizine (ANTIVERT) 25 MG tablet Take 1 tablet (25 mg total) by mouth 3 (three) times daily as needed for  dizziness or nausea.  10 tablet  0  . mometasone (NASONEX) 50 MCG/ACT nasal spray Place 2 sprays into the nose daily.  7.5 g  0  . norethindrone-ethinyl estradiol (JUNEL FE,GILDESS FE,LOESTRIN FE) 1-20 MG-MCG tablet Take 1 tablet by mouth daily.      . valACYclovir (VALTREX) 1000 MG tablet TAKE 1 TABLET BY MOUTH EVERY DAY AS NEEDED  30 tablet  1  . FLUoxetine (PROZAC) 20 MG tablet Take 1 tablet (20 mg total) by mouth daily.  30 tablet  1    BP 128/88  Pulse 65  Temp 98.3 F (36.8 C) (Oral)  Resp 16  Ht 5' 6.5" (1.689 m)  Wt 264 lb 1.3 oz (119.786 kg)  BMI 41.99 kg/m2  SpO2 99%  LMP 10/24/2011       Objective:   Physical Exam  Constitutional: She appears well-developed and well-nourished. No distress.  Psychiatric: Her speech is normal and behavior is normal. Judgment and thought content normal. Cognition and memory are normal.       Tearful at times during interview, but pleasant and appropriate.           Assessment & Plan:

## 2011-11-10 NOTE — Assessment & Plan Note (Signed)
Overall, anxiety seems somewhat improved, but she is bothered by the daytime sleepiness on the citalopram. Will try switching her to prozac to see if she is a bit less drowsy on this.  Continue xanax HS.  Most importantly, we discussed the importance of her establishing with a therapist to help her work out some of her own issues which have resurfaced recently.  She tells me that she cannot afford the co-pay for the therapist at this time.  I have asked her to let me know if she decides to move forward with referral and I will arrange. We also spoke about the importance of not overloading her schedule and allowing for some down time if possible.  30 minutes spent with pt today. >50% of this time was spent counseling pt on her anxiety.

## 2011-11-11 ENCOUNTER — Ambulatory Visit (INDEPENDENT_AMBULATORY_CARE_PROVIDER_SITE_OTHER): Payer: BC Managed Care – PPO | Admitting: Family

## 2011-11-11 ENCOUNTER — Encounter: Payer: Self-pay | Admitting: Family

## 2011-11-11 VITALS — BP 126/84 | HR 73 | Temp 98.6°F | Resp 16 | Wt 260.0 lb

## 2011-11-11 DIAGNOSIS — S93409A Sprain of unspecified ligament of unspecified ankle, initial encounter: Secondary | ICD-10-CM | POA: Insufficient documentation

## 2011-11-11 NOTE — Assessment & Plan Note (Signed)
Recommended ace wrap, ibuprofen, ice prn.  Call if increased pain, swelling.

## 2011-11-11 NOTE — Patient Instructions (Addendum)
Ankle Sprain An ankle sprain is an injury to the strong, fibrous tissues (ligaments) that hold the bones of your ankle joint together.  CAUSES Ankle sprain usually is caused by a fall or by twisting your ankle. People who participate in sports are more prone to these types of injuries.  SYMPTOMS  Symptoms of ankle sprain include:  Pain in your ankle. The pain may be present at rest or only when you are trying to stand or walk.   Swelling.   Bruising. Bruising may develop immediately or within 1 to 2 days after your injury.   Difficulty standing or walking.  DIAGNOSIS  Your caregiver will ask you details about your injury and perform a physical exam of your ankle to determine if you have an ankle sprain. During the physical exam, your caregiver will press and squeeze specific areas of your foot and ankle. Your caregiver will try to move your ankle in certain ways. An X-ray exam may be done to be sure a bone was not broken or a ligament did not separate from one of the bones in your ankle (avulsion).  TREATMENT  Certain types of braces can help stabilize your ankle. Your caregiver can make a recommendation for this. Your caregiver may recommend the use of medication for pain. If your sprain is severe, your caregiver may refer you to a surgeon who helps to restore function to parts of your skeletal system (orthopedist) or a physical therapist. HOME CARE INSTRUCTIONS  Apply ice to your injury for 1 to 2 days or as directed by your caregiver. Applying ice helps to reduce inflammation and pain.  Put ice in a plastic bag.   Place a towel between your skin and the bag.   Leave the ice on for 15 to 20 minutes at a time, every 2 hours while you are awake.   Take over-the-counter or prescription medicines for pain, discomfort, or fever only as directed by your caregiver.   Keep your injured leg elevated, when possible, to lessen swelling.   If your caregiver recommends crutches, use them as  instructed. Gradually, put weight on the affected ankle. Continue to use crutches or a cane until you can walk without feeling pain in your ankle.   If you have a plaster splint, wear the splint as directed by your caregiver. Do not rest it on anything harder than a pillow the first 24 hours. Do not put weight on it. Do not get it wet. You may take it off to take a shower or bath.   You may have been given an elastic bandage to wear around your ankle to provide support. If the elastic bandage is too tight (you have numbness or tingling in your foot or your foot becomes cold and blue), adjust the bandage to make it comfortable.   If you have an air splint, you may blow more air into it or let air out to make it more comfortable. You may take your splint off at night and before taking a shower or bath.   Wiggle your toes in the splint several times per day if you are able.  SEEK MEDICAL CARE IF:   You have an increase in bruising, swelling, or pain.   Your toes feel cold.   Pain relief is not achieved with medication.  SEEK IMMEDIATE MEDICAL CARE IF: Your toes are numb or blue or you have severe pain. MAKE SURE YOU:   Understand these instructions.   Will watch your condition.     Will get help right away if you are not doing well or get worse.  Document Released: 05/12/2005 Document Revised: 05/01/2011 Document Reviewed: 12/15/2007 Exit[employer] Patient Information 2012 [employer]. 

## 2011-11-11 NOTE — Progress Notes (Signed)
Subjective:    Patient ID: Whitney Harmon, female    DOB: [birth year], 36 y.o.   MRN: [id number]  HPI  Ms.  Harmon is a 36 yr old female who presents today with chief complaint of left ankle swelling.  Reports that she was seated at work and left leg fell asleep.  When she tried to stand on the leg, it gave out and she twisted her left ankle.  She reports that initially it was quite swollen but it has improved. She is able to weight bear without difficulty.     Review of Systems    see HPI  Past Medical History  Diagnosis Date  . Hypertension   . Asthma     childhood  . Overweight   . Elevated antinuclear antibody (ANA) level 05/12/2011    History   Social History  . Marital Status: Single    Spouse Name: N/A    Number of Children: 1  . 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 Smoking status: Never Smoker   . Smokeless tobacco: Never Used  . Alcohol Use: Yes     social, once a week  . Drug Use: No  . Sexually Active: Not on file   Other Topics Concern  . 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 Salpingoophorectomy     left    Family History  Problem Relation Age of Onset  . Arthritis Mother     osteoarthritis  . Diabetes Mother   . Hypertension Mother   . Mental illness Mother     schizophrenia  . Obesity Mother   . Schizophrenia Mother   . Hypertension Father   . Alcohol abuse    . Diabetes    . Hypertension      Allergies  Allergen Reactions  . Vilazodone Hcl Diarrhea    Current Outpatient Prescriptions on File Prior to Visit  Medication Sig Dispense Refill  . albuterol (PROAIR HFA) 108 (90 BASE) MCG/ACT inhaler Inhale 2 puffs into the lungs every 6 (six) hours as needed. For wheezing       . ALPRAZolam (XANAX) 0.25 MG tablet       . amLODipine (NORVASC) 10 MG tablet TAKE ONE TABLET BY MOUTH DAILY  30  tablet  2  . cloNIDine (CATAPRES) 0.1 MG tablet TAKE 1 TABLET BY MOUTH TWICE DAILY  60 tablet  3  . doxycycline (VIBRAMYCIN) 100 MG capsule Take 100 mg by mouth daily.      Marland Kitchen EPINEPHrine (EPIPEN) 0.3 mg/0.3 mL DEVI Inject 0.3 mg into the muscle once. Inject into muscle if you develop severe hives, tongue, lip swelling or associated shortness of breath.       Marland Kitchen FLUoxetine (PROZAC) 20 MG tablet Take 1 tablet (20 mg total) by mouth daily.  30 tablet  1  . hydrochlorothiazide (HYDRODIURIL) 25 MG tablet TAKE 1 TABLET BY MOUTH DAILY  30 tablet  3  . meclizine (ANTIVERT) 25 MG tablet Take 1 tablet (25 mg total) by mouth 3 (three) times daily as needed for dizziness or nausea.  10 tablet  0  . mometasone (NASONEX) 50 MCG/ACT nasal spray Place 2 sprays into the nose daily.  7.5 g  0  . norethindrone-ethinyl estradiol (JUNEL FE,GILDESS FE,LOESTRIN FE) 1-20 MG-MCG tablet Take 1 tablet by mouth  daily.      . valACYclovir (VALTREX) 1000 MG tablet TAKE 1 TABLET BY MOUTH EVERY DAY AS NEEDED  30 tablet  1    BP 126/84  Pulse 73  Temp 98.6 F (37 C) (Oral)  Resp 16  Wt 260 lb 0.6 oz (117.953 kg)  SpO2 99%  LMP 10/24/2011    Objective:   Physical Exam  Constitutional: She appears well-developed and well-nourished. No distress.  Pulmonary/Chest: Breath sounds normal.  Musculoskeletal:       Mild swelling/bruising of the left ankle.  Full rom of left ankle. Able to walk/weight bear without difficulty.   Psychiatric: She has a normal mood and affect. Her behavior is normal. Judgment and thought content normal.          Assessment & Plan:

## 2011-11-23 ENCOUNTER — Other Ambulatory Visit: Payer: Self-pay | Admitting: Family

## 2011-11-24 NOTE — Telephone Encounter (Signed)
Last refill of alprazolam was 10/14/11 #30. Refill left on pharmacy voicemail #30 x no refills.

## 2011-12-03 ENCOUNTER — Other Ambulatory Visit: Payer: Self-pay | Admitting: Family

## 2011-12-19 ENCOUNTER — Telehealth: Payer: Self-pay | Admitting: Family

## 2011-12-19 NOTE — Telephone Encounter (Signed)
Received medical records from Dr. Hal Morales: [phone] F: [phone]

## 2012-01-01 ENCOUNTER — Other Ambulatory Visit: Payer: Self-pay | Admitting: Family

## 2012-01-02 NOTE — Telephone Encounter (Signed)
Refill left on pharmacy voicemail; #30 x no refills. 

## 2012-01-05 ENCOUNTER — Ambulatory Visit: Payer: BC Managed Care – PPO | Admitting: Family

## 2012-01-14 ENCOUNTER — Ambulatory Visit: Payer: BC Managed Care – PPO | Admitting: Family

## 2012-01-20 ENCOUNTER — Other Ambulatory Visit: Payer: Self-pay | Admitting: Family

## 2012-01-22 ENCOUNTER — Telehealth: Payer: Self-pay | Admitting: *Deleted

## 2012-01-22 NOTE — Telephone Encounter (Signed)
Pt faxed physical form to be completed for [facility]. Left message stating that form needs to be faxed back to HR by Tuesday. AttnShirlean Schlein @ [phone].  Please advise.

## 2012-01-23 NOTE — Telephone Encounter (Signed)
Attempted to reach pt and left message to return my call. 

## 2012-01-23 NOTE — Telephone Encounter (Signed)
I reviewed form.  Also, our records show that she needs a tetanus shot.  She can come have that this afternoon or Tuesday AM and we can complete the forms.

## 2012-01-27 NOTE — Telephone Encounter (Signed)
Form signed noted that we are awaiting tetanus documentation.

## 2012-01-27 NOTE — Telephone Encounter (Signed)
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 She states that she will contact previous school system and request record be faxed to her current school but they are requiring that we return form to them today. I advised pt that we need confirmation of last tetanus before form can be completed by [place] and she is requesting that we return form to school with note that tetanus documentation is pending.  Please advise.

## 2012-01-27 NOTE — Telephone Encounter (Signed)
Left message on home/cell# for pt to return my call. 

## 2012-01-27 NOTE — Telephone Encounter (Signed)
Form faxed to [phone]. Advised pt to have documentation forwarded to [place] as well and she voices understanding. Advised her that we could provide tetanus vaccine if previous injection has been greater than 10 yrs or if she is unable to locate previous documentation. Pt voices understanding.

## 2012-01-28 ENCOUNTER — Ambulatory Visit: Payer: BC Managed Care – PPO | Admitting: Family

## 2012-02-02 ENCOUNTER — Encounter: Payer: Self-pay | Admitting: Family

## 2012-02-02 ENCOUNTER — Ambulatory Visit (INDEPENDENT_AMBULATORY_CARE_PROVIDER_SITE_OTHER): Payer: BC Managed Care – PPO | Admitting: Family

## 2012-02-02 VITALS — BP 130/80 | HR 59 | Temp 98.6°F | Resp 16 | Ht 66.5 in | Wt 273.0 lb

## 2012-02-02 DIAGNOSIS — F411 Generalized anxiety disorder: Secondary | ICD-10-CM

## 2012-02-02 DIAGNOSIS — F3289 Other specified depressive episodes: Secondary | ICD-10-CM

## 2012-02-02 DIAGNOSIS — F329 Major depressive disorder, single episode, unspecified: Secondary | ICD-10-CM

## 2012-02-02 DIAGNOSIS — F419 Anxiety disorder, unspecified: Secondary | ICD-10-CM

## 2012-02-02 MED ORDER — FLUOXETINE HCL 20 MG PO TABS: 20.0000 mg | ORAL_TABLET | Freq: Every day | ORAL | Status: DC

## 2012-02-02 MED ORDER — ALPRAZOLAM 0.25 MG PO TABS: 0.2500 mg | ORAL_TABLET | Freq: Every evening | ORAL | Status: DC | PRN

## 2012-02-02 NOTE — Progress Notes (Signed)
Subjective:    Patient ID: Whitney Harmon, female    DOB: 1975/11/02, 36 y.o.   MRN: [id number]  HPI  Whitney Harmon is a 36 yr old female who presents today for follow up of her anxiety and depression.    Anxiety- reports that she is tolerating prozac.  Struggling with relationship with son who entered middle school this year.  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  Feels fed up with everything, "I am tired of being a mom, I am tired of being a student."  Wonders if she may be bipolar.  Reports that she often will have bouts of high energy and risky behavior.  Has had some unprotected sex and experimented with drugs.    Review of Systems See HPI  Past Medical History  Diagnosis Date  . Hypertension   . Asthma     childhood  . Overweight   . Elevated antinuclear antibody (ANA) level 05/12/2011    History   Social History  . Marital Status: Single    Spouse Name: N/A    Number of Children: 1  . 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 Smoking status: Never Smoker   . Smokeless tobacco: Never Used  . Alcohol Use: Yes     social, once a week  . Drug Use: No  . Sexually Active: Not on file   Other Topics Concern  . 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 Salpingoophorectomy     left    Family History  Problem Relation Age of Onset  . Arthritis Mother     osteoarthritis  . Diabetes Mother   . Hypertension Mother   . Mental illness Mother     schizophrenia  . Obesity Mother   . Schizophrenia Mother   . Hypertension Father   . Alcohol abuse    . Diabetes    . Hypertension      Allergies  Allergen Reactions  . Vilazodone Hcl Diarrhea    Current Outpatient Prescriptions on File Prior to Visit  Medication Sig Dispense Refill  . albuterol (PROAIR HFA) 108 (90 BASE) MCG/ACT inhaler Inhale 2 puffs into the lungs every 6  (six) hours as needed. For wheezing       . amLODipine (NORVASC) 10 MG tablet TAKE ONE TABLET BY MOUTH DAILY  30 tablet  2  . cloNIDine (CATAPRES) 0.1 MG tablet TAKE 1 TABLET BY MOUTH TWICE DAILY  60 tablet  3  . doxycycline (VIBRAMYCIN) 100 MG capsule Take 100 mg by mouth daily.      Marland Kitchen EPINEPHrine (EPIPEN) 0.3 mg/0.3 mL DEVI Inject 0.3 mg into the muscle once. Inject into muscle if you develop severe hives, tongue, lip swelling or associated shortness of breath.       Marland Kitchen FLUoxetine (PROZAC) 20 MG tablet Take 1 tablet (20 mg total) by mouth daily.  30 tablet  2  . hydrochlorothiazide (HYDRODIURIL) 25 MG tablet TAKE 1 TABLET BY MOUTH DAILY  30 tablet  3  . mometasone (NASONEX) 50 MCG/ACT nasal spray Place 2 sprays into the nose daily.  7.5 g  0  . norethindrone-ethinyl estradiol (JUNEL FE,GILDESS FE,LOESTRIN FE) 1-20 MG-MCG tablet Take 1 tablet by mouth daily.      . valACYclovir (VALTREX) 1000 MG tablet TAKE 1 TABLET BY MOUTH  EVERY DAY AS NEEDED  30 tablet  1    BP 130/80  Pulse 59  Temp 98.6 F (37 C) (Oral)  Resp 16  Ht 5' 6.5" (1.689 m)  Wt 273 lb (123.832 kg)  BMI 43.40 kg/m2  SpO2 99%       Objective:   Physical Exam  Constitutional: She appears well-developed and well-nourished. No distress.  Psychiatric: She has a normal mood and affect. Her behavior is normal. Judgment and thought content normal.       Pt became briefly tearful today during interview          Assessment & Plan:

## 2012-02-02 NOTE — Patient Instructions (Addendum)
Call Dr. Carie Caddy office to schedule an appointment with Dr. Evelene Croon and her therapist [phone] Please schedule a follow up appointment in 3 months.

## 2012-02-03 NOTE — Assessment & Plan Note (Addendum)
At this point I think she could benefit from a formal psychiatric evaluate and some counseling.  Bipolar disorder is certainly a possibility.  I have given her the number for [facility] to call and set up apt with Dr. Evelene Croon and her therapist. Continue prozac and prn xanax.  >25 minutes spent with pt today.  >50% of this time was spent counseling pt on depression/anxiety.

## 2012-02-27 ENCOUNTER — Other Ambulatory Visit: Payer: Self-pay | Admitting: Family

## 2012-03-16 ENCOUNTER — Telehealth: Payer: Self-pay | Admitting: Family

## 2012-03-16 MED ORDER — ALPRAZOLAM 0.25 MG PO TABS: 0.2500 mg | ORAL_TABLET | Freq: Every evening | ORAL | Status: DC | PRN

## 2012-03-16 NOTE — Telephone Encounter (Signed)
Refill left on pharmacy voicemail; #30 x no refills. 

## 2012-03-16 NOTE — Telephone Encounter (Signed)
Refill-alprazolam 0.25mg  tablets. Take one tablet by mouth at bedtime as needed for sleep. Qty 30 last fill 9.15.13

## 2012-03-24 ENCOUNTER — Telehealth: Payer: Self-pay | Admitting: Family

## 2012-03-24 MED ORDER — CLONIDINE HCL 0.1 MG PO TABS: 0.1000 mg | ORAL_TABLET | Freq: Two times a day (BID) | ORAL | Status: DC

## 2012-03-24 NOTE — Telephone Encounter (Signed)
Refill- clonidine 0.1mg  tablets. Take one tablet by mouth twice daily. Qty 60 last fill 10.21.13

## 2012-03-24 NOTE — Telephone Encounter (Signed)
Refill sent #60 x 2 refills.

## 2012-04-20 ENCOUNTER — Telehealth: Payer: Self-pay | Admitting: Family

## 2012-04-20 NOTE — Telephone Encounter (Signed)
Refill- alprazolam 0.25mg  tablets. Take one tablet by mouth at bedtime as needed for sleep. Qty 30 last fill 10.22.13

## 2012-04-21 MED ORDER — ALPRAZOLAM 0.25 MG PO TABS: 0.2500 mg | ORAL_TABLET | Freq: Every evening | ORAL | Status: DC | PRN

## 2012-04-21 NOTE — Telephone Encounter (Signed)
Refill left on pharmacy voicemail, #30 x no refills. 

## 2012-05-03 ENCOUNTER — Ambulatory Visit (INDEPENDENT_AMBULATORY_CARE_PROVIDER_SITE_OTHER): Payer: BC Managed Care – PPO | Admitting: Family

## 2012-05-03 ENCOUNTER — Encounter: Payer: Self-pay | Admitting: Family

## 2012-05-03 VITALS — BP 154/86 | HR 88 | Temp 98.7°F | Resp 16 | Ht 66.5 in | Wt 273.0 lb

## 2012-05-03 DIAGNOSIS — F329 Major depressive disorder, single episode, unspecified: Secondary | ICD-10-CM

## 2012-05-03 DIAGNOSIS — E669 Obesity, unspecified: Secondary | ICD-10-CM

## 2012-05-03 DIAGNOSIS — F341 Dysthymic disorder: Secondary | ICD-10-CM

## 2012-05-03 DIAGNOSIS — I10 Essential (primary) hypertension: Secondary | ICD-10-CM

## 2012-05-03 NOTE — Assessment & Plan Note (Signed)
We discussed working on her mental health and wellness prior to considering bariatric surgery.

## 2012-05-03 NOTE — Progress Notes (Signed)
Subjective:    Patient ID: Whitney Harmon, female    DOB: [birth year], 36 y.o.   MRN: [id number]  HPI  Ms.  Whitney Harmon is a 36 yr old female who presents today for follow up.  1) Anxiety/depression- She has apt with psychiatry for initial consult within the next month. She continues prozac 20 mg.  Naps a lot, thinks that the prozac may make her sleepy.  She reports that she has trouble sleeping without xanax.  She has to take the xanax and the prozac to sleep. Reports that she sometimes feels like she has some OCD tendencies.  Feels like she is "not happy" with any aspect of her life.    2) HTN-  Reports that she ran out of clonidine over the weekend and plans to resume.   3) Obesity- she is contemplating consult for bariatric surgery.   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  Review of Systems    see HPI  Past Medical History  Diagnosis Date  . Hypertension   . Asthma     childhood  . Overweight   . Elevated antinuclear antibody (ANA) level 05/12/2011    History   Social History  . Marital Status: Single    Spouse Name: N/A    Number of Children: 1  . 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 Smoking status: Never Smoker   . Smokeless tobacco: Never Used  . Alcohol Use: Yes     Comment: social, once a week  . Drug Use: No  . Sexually Active: Not on file   Other Topics Concern  . 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 Salpingoophorectomy     left    Family History  Problem Relation Age of Onset  . Arthritis Mother     osteoarthritis  . Diabetes Mother   . Hypertension Mother   . Mental illness Mother     schizophrenia  . Obesity Mother   . Schizophrenia Mother   . Hypertension Father   . Alcohol abuse    . Diabetes    . Hypertension       Allergies  Allergen Reactions  . Vilazodone Hcl Diarrhea    Current Outpatient Prescriptions on File Prior to Visit  Medication Sig Dispense Refill  . albuterol (PROAIR HFA) 108 (90 BASE) MCG/ACT inhaler Inhale 2 puffs into the lungs every 6 (six) hours as needed. For wheezing       . ALPRAZolam (XANAX) 0.25 MG tablet Take 1 tablet (0.25 mg total) by mouth at bedtime as needed for sleep.  30 tablet  0  . amLODipine (NORVASC) 10 MG tablet TAKE ONE TABLET BY MOUTH DAILY  30 tablet  2  . clindamycin (CLEOCIN T) 1 % external solution Apply to skin daily as needed.      . cloNIDine (CATAPRES) 0.1 MG tablet Take 1 tablet (0.1 mg total) by mouth 2 (two) times daily.  60 tablet  2  . doxycycline (VIBRAMYCIN) 100 MG capsule Take 100 mg by mouth daily.      Marland Kitchen EPINEPHrine (EPIPEN) 0.3 mg/0.3 mL DEVI Inject 0.3 mg into the muscle once. Inject into muscle if you develop severe hives, tongue, lip swelling or associated shortness of  breath.       Marland Kitchen FLUoxetine (PROZAC) 20 MG tablet Take 1 tablet (20 mg total) by mouth daily.  30 tablet  2  . hydrochlorothiazide (HYDRODIURIL) 25 MG tablet TAKE 1 TABLET BY MOUTH DAILY  30 tablet  2  . mometasone (NASONEX) 50 MCG/ACT nasal spray Place 2 sprays into the nose daily.  7.5 g  0  . norethindrone-ethinyl estradiol (JUNEL FE,GILDESS FE,LOESTRIN FE) 1-20 MG-MCG tablet Take 1 tablet by mouth daily.      . valACYclovir (VALTREX) 1000 MG tablet TAKE 1 TABLET BY MOUTH EVERY DAY AS NEEDED  30 tablet  1    BP 154/86  Pulse 88  Temp 98.7 F (37.1 C) (Oral)  Resp 16  Ht 5' 6.5" (1.689 m)  Wt 273 lb (123.832 kg)  BMI 43.40 kg/m2  SpO2 99%    Objective:   Physical Exam  Constitutional: She appears well-developed and well-nourished. No distress.  Pulmonary/Chest: Effort normal and breath sounds normal.  Lymphadenopathy:       No right axillary lymphadenopathy noted.   Psychiatric: She has a normal mood and affect. Her behavior is normal. Judgment and thought  content normal.          Assessment & Plan:  25 minutes spent with pt today.  >50% of this time was spent counseling pt on her depression/anxiety/obesity

## 2012-05-03 NOTE — Assessment & Plan Note (Signed)
Deteriorated. Resume clonidine, reinforced importance with med compliance.

## 2012-05-03 NOTE — Assessment & Plan Note (Signed)
Unchanged. Will refer to psychiatry for further evaluation.  Defer med management to psychiatry.

## 2012-05-03 NOTE — Patient Instructions (Addendum)
Resume clonidine. Keep upcoming appointment with psychiatry. Follow up in 3 months.

## 2012-05-31 ENCOUNTER — Telehealth: Payer: Self-pay | Admitting: Family

## 2012-05-31 MED ORDER — AMLODIPINE BESYLATE 10 MG PO TABS: 10.0000 mg | ORAL_TABLET | Freq: Every day | ORAL | Status: DC

## 2012-05-31 NOTE — Addendum Note (Signed)
Addended by: Mervin Kung A on: 05/31/2012 04:06 PM   Modules accepted: Orders

## 2012-05-31 NOTE — Telephone Encounter (Signed)
Received escribe error, re-sent refill.

## 2012-05-31 NOTE — Telephone Encounter (Signed)
REfill sent. 

## 2012-05-31 NOTE — Telephone Encounter (Signed)
Refill- amlodipine besylate 10mg  tablets. Take one tablet by mouth daily. Qty 30 last fill 12.4.13

## 2012-06-28 ENCOUNTER — Telehealth: Payer: Self-pay | Admitting: Family

## 2012-06-28 MED ORDER — HYDROCHLOROTHIAZIDE 25 MG PO TABS: 25.0000 mg | ORAL_TABLET | Freq: Every day | ORAL | Status: DC

## 2012-06-28 NOTE — Telephone Encounter (Signed)
Refill- hydrochlorothiazide 25mg  tablets. Take one tablet by mouth daily. Qty 30 last fill 12.29.13

## 2012-06-28 NOTE — Telephone Encounter (Signed)
Refill sent #30 x 2 refills. 

## 2012-08-02 ENCOUNTER — Ambulatory Visit: Payer: BC Managed Care – PPO | Admitting: Family

## 2012-08-03 ENCOUNTER — Ambulatory Visit: Payer: BC Managed Care – PPO | Admitting: Family

## 2012-08-25 ENCOUNTER — Encounter: Payer: Self-pay | Admitting: Family

## 2012-08-25 ENCOUNTER — Ambulatory Visit (INDEPENDENT_AMBULATORY_CARE_PROVIDER_SITE_OTHER): Payer: BC Managed Care – PPO | Admitting: Family

## 2012-08-25 VITALS — BP 122/90 | HR 70 | Temp 98.1°F | Resp 16 | Wt 273.0 lb

## 2012-08-25 DIAGNOSIS — F329 Major depressive disorder, single episode, unspecified: Secondary | ICD-10-CM

## 2012-08-25 DIAGNOSIS — F341 Dysthymic disorder: Secondary | ICD-10-CM

## 2012-08-25 DIAGNOSIS — I10 Essential (primary) hypertension: Secondary | ICD-10-CM

## 2012-08-25 DIAGNOSIS — L500 Allergic urticaria: Secondary | ICD-10-CM

## 2012-08-25 LAB — BASIC METABOLIC PANEL
CO2: 28 mEq/L (ref 19–32)
Chloride: 103 mEq/L (ref 96–112)
Creat: 0.89 mg/dL (ref 0.50–1.10)
Glucose, Bld: 79 mg/dL (ref 70–99)
Sodium: 140 mEq/L (ref 135–145)

## 2012-08-25 MED ORDER — FLUTICASONE PROPIONATE 50 MCG/ACT NA SUSP: 2.0000 | Freq: Every day | NASAL | Status: DC

## 2012-08-25 NOTE — Patient Instructions (Addendum)
Please complete lab work prior to leaving. Follow up in 3 months.  

## 2012-08-25 NOTE — Assessment & Plan Note (Addendum)
BP Readings from Last 3 Encounters:  08/25/12 122/90  05/03/12 154/86  02/02/12 130/80   BP stable on amlodipine, clonidine and hctz.  Continue same. Obtain bmet.

## 2012-08-25 NOTE — Progress Notes (Signed)
Subjective:    Patient ID: Whitney Harmon, female    DOB: [birth year], 37 y.o.   MRN: [id number]  HPI  Whitney Harmon is a 37 yr old female who presents today for follow up.  1) HTN- she contineus hctz, amlodipine.   2) Allergic Urticaria- irritation right AC since dog has been staying with her.  He usually stays with her grandparents in [place]. She has know dog allergy.   3) anxiety/depression-  Reports that she has been following with dr. Evelene Croon who increased her prozac dose. She also diagnosed the patient with bipolar disorder.     Review of Systems    see HPI  Past Medical History  Diagnosis Date  . Hypertension   . Asthma     childhood  . Overweight   . Elevated antinuclear antibody (ANA) level 05/12/2011  . Bipolar disorder     History   Social History  . Marital Status: Single    Spouse Name: N/A    Number of Children: 1  . 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 Smoking status: Never Smoker   . Smokeless tobacco: Never Used  . Alcohol Use: Yes     Comment: social, once a week  . Drug Use: No  . Sexually Active: Not on file   Other Topics Concern  . 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 Salpingoophorectomy      left    Family History  Problem Relation Age of Onset  . Arthritis Mother     osteoarthritis  . Diabetes Mother   . Hypertension Mother   . Mental illness Mother     schizophrenia  . Obesity Mother   . Schizophrenia Mother   . Hypertension Father   . Alcohol abuse    . Diabetes    . Hypertension      Allergies  Allergen Reactions  . Vilazodone Hcl Diarrhea    Current Outpatient Prescriptions on File Prior to Visit  Medication Sig Dispense Refill  . albuterol (PROAIR HFA) 108 (90 BASE) MCG/ACT inhaler Inhale 2 puffs into the lungs every 6 (six) hours as needed. For  wheezing       . ALPRAZolam (XANAX) 0.25 MG tablet Take 1 tablet (0.25 mg total) by mouth at bedtime as needed for sleep.  30 tablet  0  . amLODipine (NORVASC) 10 MG tablet Take 1 tablet (10 mg total) by mouth daily.  30 tablet  2  . clindamycin (CLEOCIN T) 1 % external solution Apply to skin daily as needed.      . cloNIDine (CATAPRES) 0.1 MG tablet Take 1 tablet (0.1 mg total) by mouth 2 (two) times daily.  60 tablet  2  . doxycycline (VIBRAMYCIN) 100 MG capsule Take 100 mg by mouth daily.      Marland Kitchen EPINEPHrine (EPIPEN) 0.3 mg/0.3 mL DEVI Inject 0.3 mg into the muscle once. Inject into muscle if you develop severe hives, tongue, lip swelling or associated shortness of breath.       Marland Kitchen FLUoxetine (PROZAC) 20 MG tablet Take 1 tablet (20 mg total) by mouth daily.  30 tablet  2  . hydrochlorothiazide (HYDRODIURIL) 25 MG tablet Take 1 tablet (25 mg total) by mouth daily.  30 tablet  2  . mometasone (NASONEX) 50  MCG/ACT nasal spray Place 2 sprays into the nose daily.  7.5 g  0  . norethindrone-ethinyl estradiol (JUNEL FE,GILDESS FE,LOESTRIN FE) 1-20 MG-MCG tablet Take 1 tablet by mouth daily.      . valACYclovir (VALTREX) 1000 MG tablet TAKE 1 TABLET BY MOUTH EVERY DAY AS NEEDED  30 tablet  1   No current facility-administered medications on file prior to visit.    BP 122/90  Pulse 70  Temp(Src) 98.1 F (36.7 C) (Oral)  Resp 16  Wt 273 lb (123.832 kg)  BMI 43.41 kg/m2  SpO2 99%    Objective:   Physical Exam  Constitutional: She is oriented to person, place, and time. She appears well-developed and well-nourished. No distress.  Cardiovascular: Normal rate and regular rhythm.   No murmur heard. Pulmonary/Chest: Effort normal and breath sounds normal. No respiratory distress. She has no wheezes. She has no rales. She exhibits no tenderness.  Neurological: She is alert and oriented to person, place, and time.  Psychiatric: She has a normal mood and affect. Her behavior is normal. Judgment and  thought content normal.          Assessment & Plan:

## 2012-08-25 NOTE — Assessment & Plan Note (Signed)
Stable on prozac, management per psychiatry.

## 2012-08-25 NOTE — Assessment & Plan Note (Signed)
?   Eczema vs urticaria right AC fossa. Recommend that she resume claritin, use hydrocortisone prn, avoid dog, has epi pen if necessary.

## 2012-08-26 ENCOUNTER — Encounter: Payer: Self-pay | Admitting: Family

## 2012-08-30 ENCOUNTER — Telehealth: Payer: Self-pay | Admitting: Family

## 2012-08-30 MED ORDER — AMLODIPINE BESYLATE 10 MG PO TABS: 10.0000 mg | ORAL_TABLET | Freq: Every day | ORAL | Status: DC

## 2012-08-30 NOTE — Telephone Encounter (Signed)
Refill- amlodipine besylate 10mg  tablets. Take one tablet by mouth daily. Qty 30 last fill 3.9.14

## 2012-09-13 ENCOUNTER — Telehealth: Payer: Self-pay | Admitting: Family

## 2012-09-13 MED ORDER — CLONIDINE HCL 0.1 MG PO TABS: 0.1000 mg | ORAL_TABLET | Freq: Two times a day (BID) | ORAL | Status: DC

## 2012-09-13 NOTE — Telephone Encounter (Signed)
Clonidine 0.1 mg tablet take 1 tablet by mouth twice daily qty 60

## 2012-09-16 ENCOUNTER — Encounter (INDEPENDENT_AMBULATORY_CARE_PROVIDER_SITE_OTHER): Payer: Self-pay | Admitting: General Surgery

## 2012-09-16 ENCOUNTER — Ambulatory Visit (INDEPENDENT_AMBULATORY_CARE_PROVIDER_SITE_OTHER): Payer: BC Managed Care – PPO | Admitting: General Surgery

## 2012-09-16 LAB — LIPID PANEL
Cholesterol: 206 mg/dL — ABNORMAL HIGH (ref 0–200)
HDL: 68 mg/dL
LDL Cholesterol: 122 mg/dL — ABNORMAL HIGH (ref 0–99)
Total CHOL/HDL Ratio: 3 ratio
Triglycerides: 81 mg/dL
VLDL: 16 mg/dL (ref 0–40)

## 2012-09-16 LAB — CBC WITH DIFFERENTIAL/PLATELET
Basophils Absolute: 0 K/uL (ref 0.0–0.1)
Basophils Relative: 0 % (ref 0–1)
Eosinophils Absolute: 0.1 K/uL (ref 0.0–0.7)
Eosinophils Relative: 1 % (ref 0–5)
HCT: 38.4 % (ref 36.0–46.0)
Hemoglobin: 12.9 g/dL (ref 12.0–15.0)
Lymphocytes Relative: 21 % (ref 12–46)
Lymphs Abs: 2.8 K/uL (ref 0.7–4.0)
MCH: 23.5 pg — ABNORMAL LOW (ref 26.0–34.0)
MCHC: 33.6 g/dL (ref 30.0–36.0)
MCV: 70.1 fL — ABNORMAL LOW (ref 78.0–100.0)
Monocytes Absolute: 0.5 K/uL (ref 0.1–1.0)
Monocytes Relative: 4 % (ref 3–12)
Neutro Abs: 10 K/uL — ABNORMAL HIGH (ref 1.7–7.7)
Neutrophils Relative %: 74 % (ref 43–77)
Platelets: 377 K/uL (ref 150–400)
RBC: 5.48 MIL/uL — ABNORMAL HIGH (ref 3.87–5.11)
RDW: 15.4 % (ref 11.5–15.5)
WBC: 13.4 K/uL — ABNORMAL HIGH (ref 4.0–10.5)

## 2012-09-16 LAB — COMPREHENSIVE METABOLIC PANEL WITH GFR
ALT: 16 U/L (ref 0–35)
AST: 15 U/L (ref 0–37)
Albumin: 4.4 g/dL (ref 3.5–5.2)
Alkaline Phosphatase: 66 U/L (ref 39–117)
BUN: 10 mg/dL (ref 6–23)
CO2: 29 meq/L (ref 19–32)
Calcium: 9.7 mg/dL (ref 8.4–10.5)
Chloride: 98 meq/L (ref 96–112)
Creat: 0.83 mg/dL (ref 0.50–1.10)
Glucose, Bld: 84 mg/dL (ref 70–99)
Potassium: 4 meq/L (ref 3.5–5.3)
Sodium: 137 meq/L (ref 135–145)
Total Bilirubin: 0.4 mg/dL (ref 0.3–1.2)
Total Protein: 7.8 g/dL (ref 6.0–8.3)

## 2012-09-16 LAB — TSH: TSH: 0.366 u[IU]/mL (ref 0.350–4.500)

## 2012-09-16 NOTE — Progress Notes (Signed)
Patient ID: Whitney Harmon, female   DOB: 01/11/1976, 37 y.o.   MRN: [id number]  Chief Complaint  Patient presents with  . Weight Loss Surgery    gastric sleeve    HPI Whitney Harmon is a 37 y.o. female.   This patient presents for her initial weight loss surgery evaluation. She has a BMI of 44 with obesity related comorbidities of hypertension, depression, anxiety, and skin lupus. She has trouble with her weight all of her life and has basically been up and down with her weight. She's tried several diets and the most successful diet was a physician supervised diet and phentermine where she lost about 70 pounds but regained most of it. She is most interested in a sleeve gastrectomy because she is afraid of having a device interbody and is concerned about the rearrangement of her intestines associated with a gastric bypass. She denies any heartburn. She is not currently exercising but she did recently join the [facility]. She is friends with another one of my patients who has had a sleeve gastrectomy and has been doing well with her procedure. HPI  Past Medical History  Diagnosis Date  . Hypertension   . Asthma     childhood  . Overweight   . Elevated antinuclear antibody (ANA) level 05/12/2011  . Bipolar disorder     Past Surgical History  Procedure Laterality Date  . Salpingoophorectomy  2007    left    Family History  Problem Relation Age of Onset  . Arthritis Mother     osteoarthritis  . Diabetes Mother   . Hypertension Mother   . Mental illness Mother     schizophrenia  . Obesity Mother   . Schizophrenia Mother   . Hypertension Father   . Alcohol abuse    . Diabetes    . Hypertension      Social History History  Substance Use Topics  . Smoking status: Never Smoker   . Smokeless tobacco: Never Used  . Alcohol Use: Yes     Comment: social, once a week    Allergies  Allergen Reactions  . Vilazodone Hcl Diarrhea    Current Outpatient Prescriptions  Medication  Sig Dispense Refill  . albuterol (PROAIR HFA) 108 (90 BASE) MCG/ACT inhaler Inhale 2 puffs into the lungs every 6 (six) hours as needed. For wheezing       . ALPRAZolam (XANAX) 0.25 MG tablet Take 1 tablet (0.25 mg total) by mouth at bedtime as needed for sleep.  30 tablet  0  . amLODipine (NORVASC) 10 MG tablet Take 1 tablet (10 mg total) by mouth daily.  30 tablet  3  . clindamycin (CLEOCIN T) 1 % external solution Apply to skin daily as needed.      . cloNIDine (CATAPRES) 0.1 MG tablet Take 1 tablet (0.1 mg total) by mouth 2 (two) times daily.  60 tablet  3  . FLUoxetine (PROZAC) 20 MG tablet Take 1 tablet (20 mg total) by mouth daily.  30 tablet  2  . hydrochlorothiazide (HYDRODIURIL) 25 MG tablet Take 1 tablet (25 mg total) by mouth daily.  30 tablet  2  . norethindrone-ethinyl estradiol (JUNEL FE,GILDESS FE,LOESTRIN FE) 1-20 MG-MCG tablet Take 1 tablet by mouth daily.      . fluticasone (FLONASE) 50 MCG/ACT nasal spray Place 2 sprays into the nose daily.  16 g  6  . mometasone (NASONEX) 50 MCG/ACT nasal spray Place 2 sprays into the nose daily.  7.5 g  0  . valACYclovir (VALTREX) 1000 MG tablet TAKE 1 TABLET BY MOUTH EVERY DAY AS NEEDED  30 tablet  1   No current facility-administered medications for this visit.    Review of Systems Review of Systems All other review of systems negative or noncontributory except as stated in the HPI  Blood pressure 130/82, pulse 100, resp. rate 20, height 5\' 7"  (1.702 m), weight 280 lb (127.007 kg).  Physical Exam Physical Exam Physical Exam  Nursing note and vitals reviewed. Constitutional: She is oriented to person, place, and time. She appears well-developed and well-nourished. No distress.  HENT:  Head: Normocephalic and atraumatic.  Mouth/Throat: No oropharyngeal exudate.  Eyes: Conjunctivae and EOM are normal. Pupils are equal, round, and reactive to light. Right eye exhibits no discharge. Left eye exhibits no discharge. No scleral icterus.   Neck: Normal range of motion. Neck supple. No tracheal deviation present.  Cardiovascular: Normal rate, regular rhythm, normal heart sounds and intact distal pulses.   Pulmonary/Chest: Effort normal and breath sounds normal. No stridor. No respiratory distress. She has no wheezes.  Abdominal: Soft. Bowel sounds are normal. She exhibits no distension and no mass. There is no tenderness. There is no rebound and no guarding.  Musculoskeletal: Normal range of motion. She exhibits no edema and no tenderness.  Neurological: She is alert and oriented to person, place, and time.  Skin: Skin is warm and dry. No rash noted. She is not diaphoretic. No erythema. No pallor.  Psychiatric: She has a normal mood and affect. Her behavior is normal. Judgment and thought content normal.    Data Reviewed  Assessment    Morbid obesity with BMI of 44 and skin lupus, anxiety, HTN, and depression  we'll long discussion regarding medical weight lossoptions which she has pretty much exhausted and surgical options including the lap band, sleeve gastrectomy, and Roux-en-Y gastric bypass. We discussed the pros and cons and the risks and benefits of each option and I think that she is leaning towards a vertical sleeve gastrectomy. We discussed the risks of this procedure. The risks of infection, bleeding, pain, scarring, weight regain, too little or too much weight loss, vitamin deficiencies and need for lifelong vitamin supplementation, hair loss, need for protein supplementation, leaks, stricture, reflux, food intolerance, need for reoperation and conversion to roux Y gastric bypass, need for open surgery, injury to spleen or surrounding structures, DVT's, PE, and death again discussed with the patient and the patient expressed understanding and desires to proceed with laparoscopic vertical sleeve gastrectomy, possible open, intraoperative endoscopy.     Plan    We will go ahead and start her on the path to weight loss  surgery including upper GI, nutrition labs, and nutrition and psychology evaluation.        Lodema Pilot DAVID 09/16/2012, 5:06 PM

## 2012-10-05 ENCOUNTER — Encounter: Payer: Self-pay | Admitting: Family

## 2012-10-06 ENCOUNTER — Ambulatory Visit: Payer: BC Managed Care – PPO | Admitting: *Deleted

## 2012-10-07 ENCOUNTER — Encounter: Payer: Self-pay | Admitting: *Deleted

## 2012-10-12 ENCOUNTER — Telehealth: Payer: Self-pay | Admitting: Family

## 2012-10-12 MED ORDER — HYDROCHLOROTHIAZIDE 25 MG PO TABS: 25.0000 mg | ORAL_TABLET | Freq: Every day | ORAL | Status: DC

## 2012-10-12 NOTE — Telephone Encounter (Signed)
Refills sent

## 2012-10-12 NOTE — Telephone Encounter (Signed)
Refill- hydrochlorothiazide 25mg  tab. Take one tablet by mouth daily. Qty 30 last fill 4.17.14

## 2012-10-13 ENCOUNTER — Ambulatory Visit (HOSPITAL_COMMUNITY): Payer: BC Managed Care – PPO

## 2012-10-25 ENCOUNTER — Ambulatory Visit: Payer: BC Managed Care – PPO | Admitting: Family

## 2012-10-27 ENCOUNTER — Ambulatory Visit: Payer: BC Managed Care – PPO | Admitting: *Deleted

## 2012-11-02 ENCOUNTER — Other Ambulatory Visit: Payer: Self-pay

## 2012-11-02 ENCOUNTER — Ambulatory Visit (HOSPITAL_COMMUNITY)
Admission: RE | Admit: 2012-11-02 | Discharge: 2012-11-02 | Disposition: A | Discharge status: Home / Self Care | Payer: BC Managed Care – PPO | Source: Ambulatory Visit | Attending: General Surgery | Admitting: General Surgery

## 2012-11-02 DIAGNOSIS — I10 Essential (primary) hypertension: Secondary | ICD-10-CM | POA: Insufficient documentation

## 2012-11-02 DIAGNOSIS — Z6841 Body Mass Index (BMI) 40.0 and over, adult: Secondary | ICD-10-CM | POA: Insufficient documentation

## 2012-11-02 DIAGNOSIS — F411 Generalized anxiety disorder: Secondary | ICD-10-CM | POA: Insufficient documentation

## 2012-11-02 DIAGNOSIS — M329 Systemic lupus erythematosus, unspecified: Secondary | ICD-10-CM | POA: Insufficient documentation

## 2012-11-05 ENCOUNTER — Encounter: Payer: Self-pay | Admitting: *Deleted

## 2012-11-05 ENCOUNTER — Encounter: Payer: BC Managed Care – PPO | Attending: Family | Admitting: *Deleted

## 2012-11-05 VITALS — Ht 67.0 in | Wt 279.5 lb

## 2012-11-05 DIAGNOSIS — Z01818 Encounter for other preprocedural examination: Secondary | ICD-10-CM | POA: Insufficient documentation

## 2012-11-05 DIAGNOSIS — E669 Obesity, unspecified: Secondary | ICD-10-CM

## 2012-11-05 DIAGNOSIS — Z713 Dietary counseling and surveillance: Secondary | ICD-10-CM | POA: Insufficient documentation

## 2012-11-05 NOTE — Progress Notes (Signed)
  Pre-Op Assessment Visit:  Pre-Operative Gastric Sleeve Surgery  Medical Nutrition Therapy:  Appt start time: 0800   End time:  0900.  Patient was seen on 11/05/2012 for Pre-Operative Gastric Sleeve Nutrition Assessment. Assessment and letter of approval faxed to [facility] B[facility] Program coordinator on 11/05/2012.  Approval letter sent to [facility] and will be available in the chart under the media tab.  Handouts given during visit include:  Pre-Op Goals   Bariatric Surgery Protein Shakes  Patient to call for Pre-Op and Post-Op Nutrition Education at the [facility] when surgery is scheduled.

## 2012-11-05 NOTE — Patient Instructions (Addendum)
   Follow Pre-Op Nutrition Goals to prepare for Gastric Sleeve Surgery.   Call the [facility] at [phone] once you have been given your surgery date to enrolled in the Pre-Op Nutrition Class. You will need to attend this nutrition class 3-4 weeks prior to your surgery.  

## 2012-11-29 ENCOUNTER — Ambulatory Visit: Payer: BC Managed Care – PPO | Admitting: Family

## 2012-12-01 ENCOUNTER — Ambulatory Visit: Payer: BC Managed Care – PPO | Admitting: *Deleted

## 2012-12-04 ENCOUNTER — Ambulatory Visit: Payer: BC Managed Care – PPO | Admitting: *Deleted

## 2012-12-06 ENCOUNTER — Ambulatory Visit: Payer: BC Managed Care – PPO | Admitting: Family

## 2012-12-21 ENCOUNTER — Encounter: Payer: Self-pay | Admitting: Family

## 2012-12-21 ENCOUNTER — Ambulatory Visit (INDEPENDENT_AMBULATORY_CARE_PROVIDER_SITE_OTHER): Payer: BC Managed Care – PPO | Admitting: Family

## 2012-12-21 VITALS — BP 122/70 | HR 67 | Temp 98.8°F | Resp 20 | Ht 66.5 in | Wt 290.1 lb

## 2012-12-21 DIAGNOSIS — I10 Essential (primary) hypertension: Secondary | ICD-10-CM

## 2012-12-21 DIAGNOSIS — E669 Obesity, unspecified: Secondary | ICD-10-CM

## 2012-12-21 DIAGNOSIS — F341 Dysthymic disorder: Secondary | ICD-10-CM

## 2012-12-21 DIAGNOSIS — F419 Anxiety disorder, unspecified: Secondary | ICD-10-CM

## 2012-12-21 NOTE — Progress Notes (Signed)
Subjective:    Patient ID: Whitney Harmon, female    DOB: 03-Nov-1975, 37 y.o.   MRN: [id number]  HPI  Obesity-  She has gained 11 pounds since her last weigh in in the end of June.    HTN- She is currently maintained on amlodipine, clonidine, and HCTZ.  Anxiety/depression- she is on prozac. This is being managed by Dr. Evelene Croon.  She is also following with Dr. Jacki Cones (?psychologist) who specializes in helping people with eating disorders.  He is helping her work on her binge eating.  She reports recent binge eating due to stress of mother's recent mental health hospitalization. Overall mood has been fair. Some days has trouble getting out of bed.    Review of Systems See HPI  Past Medical History  Diagnosis Date  . Hypertension   . Asthma     childhood  . Overweight(278.02)   . Elevated antinuclear antibody (ANA) level 05/12/2011  . Bipolar disorder     History   Social History  . Marital Status: Single    Spouse Name: N/A    Number of Children: 1  . 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 Smoking status: Never Smoker   . Smokeless tobacco: Never Used  . Alcohol Use: Yes     Comment: social, once a week  . Drug Use: No  . Sexually Active: Not on file   Other Topics Concern  . 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 Salpingoophorectomy  2007    left    Family History  Problem Relation Age of Onset  . Arthritis Mother     osteoarthritis  . Diabetes Mother   . Hypertension Mother   . Mental illness Mother     schizophrenia  . Obesity Mother   . Schizophrenia Mother   . Hypertension Father   . Alcohol abuse    . Diabetes    . Hypertension      Allergies  Allergen Reactions  . Peanuts (Peanut Oil) Itching    Has Epi pen, though states only has itching/hives  . Apple Hives and Itching    Apples  .  Vilazodone Hcl Diarrhea    Current Outpatient Prescriptions on File Prior to Visit  Medication Sig Dispense Refill  . albuterol (PROAIR HFA) 108 (90 BASE) MCG/ACT inhaler Inhale 2 puffs into the lungs every 6 (six) hours as needed. For wheezing      . ALPRAZolam (XANAX) 0.25 MG tablet Take 1 tablet (0.25 mg total) by mouth at bedtime as needed for sleep.  30 tablet  0  . amLODipine (NORVASC) 10 MG tablet Take 1 tablet (10 mg total) by mouth daily.  30 tablet  3  . cloNIDine (CATAPRES) 0.1 MG tablet Take 1 tablet (0.1 mg total) by mouth 2 (two) times daily.  60 tablet  3  . EPINEPHrine (EPIPEN IJ) Inject as directed as needed.      Marland Kitchen FLUoxetine (PROZAC) 20 MG tablet Take 1 tablet (20 mg total) by mouth daily.  30 tablet  2  . fluticasone (FLONASE) 50 MCG/ACT nasal spray Place 2 sprays into the nose daily.  16 g  6  . hydrochlorothiazide (HYDRODIURIL) 25 MG tablet Take 1 tablet (25 mg total) by mouth daily.  30 tablet  2  .  loratadine (CLARITIN) 10 MG tablet Take 10 mg by mouth daily.      . mometasone (NASONEX) 50 MCG/ACT nasal spray Place 2 sprays into the nose daily.  7.5 g  0  . norethindrone-ethinyl estradiol (JUNEL FE,GILDESS FE,LOESTRIN FE) 1-20 MG-MCG tablet Take 1 tablet by mouth daily.      . phentermine (ADIPEX-P) 37.5 MG tablet Take 18.75 mg by mouth daily before breakfast.      . valACYclovir (VALTREX) 1000 MG tablet TAKE 1 TABLET BY MOUTH EVERY DAY AS NEEDED  30 tablet  1   No current facility-administered medications on file prior to visit.    BP 122/70  Pulse 67  Temp(Src) 98.8 F (37.1 C) (Oral)  Resp 20  Ht 5' 6.5" (1.689 m)  Wt 290 lb 1.3 oz (131.579 kg)  BMI 46.12 kg/m2  SpO2 98%       Objective:   Physical Exam  Constitutional: She is oriented to person, place, and time. She appears well-developed and well-nourished. No distress.  Musculoskeletal: She exhibits no edema.  Neurological: She is alert and oriented to person, place, and time.  Psychiatric: She  has a normal mood and affect. Her behavior is normal. Judgment and thought content normal.          Assessment & Plan:

## 2012-12-21 NOTE — Patient Instructions (Addendum)
Please schedule follow up with Dr. Evelene Croon.   Please schedule a follow up appointment in 6 months.

## 2012-12-22 NOTE — Assessment & Plan Note (Signed)
Depression, does not seem optimally controlled. I advised pt to schedule follow up with Dr. Evelene Croon.  >25 minutes spent with pt today.  >50% of this time was spent counseling pt on depression/anxiety and weight loss.

## 2012-12-22 NOTE — Assessment & Plan Note (Signed)
Currently stable on current meds.

## 2012-12-22 NOTE — Assessment & Plan Note (Signed)
Ongoing weight gain.  She continues work with therapist and is considering scheduling her lap band surgery.

## 2012-12-29 ENCOUNTER — Encounter: Payer: Self-pay | Admitting: Family

## 2012-12-29 ENCOUNTER — Telehealth: Payer: Self-pay | Admitting: *Deleted

## 2012-12-29 NOTE — Telephone Encounter (Signed)
Pt walked in to the office requesting a letter to [facility] stating need for bariatric weight loss surgery, co-morbidities and treatments tried. Also needs form signed with last 5 years of weights and previous treatments tried (see letter).

## 2012-12-29 NOTE — Telephone Encounter (Signed)
See letter.

## 2012-12-30 NOTE — Telephone Encounter (Signed)
Left message on cell# that form/letter completed and faxed to [phone] and I will mail copies to the pt and to call if any questions.

## 2013-01-12 ENCOUNTER — Other Ambulatory Visit: Payer: Self-pay | Admitting: Family

## 2013-01-12 NOTE — Telephone Encounter (Signed)
Rx request to pharmacy/[facility]  

## 2013-02-12 ENCOUNTER — Other Ambulatory Visit: Payer: Self-pay | Admitting: Family

## 2013-03-13 ENCOUNTER — Emergency Department (HOSPITAL_BASED_OUTPATIENT_CLINIC_OR_DEPARTMENT_OTHER)
Admission: EM | Admit: 2013-03-13 | Discharge: 2013-03-13 | Disposition: A | Discharge status: Home / Self Care | Payer: BC Managed Care – PPO | Attending: Emergency Medicine | Admitting: Emergency Medicine

## 2013-03-13 ENCOUNTER — Emergency Department (HOSPITAL_BASED_OUTPATIENT_CLINIC_OR_DEPARTMENT_OTHER): Payer: BC Managed Care – PPO

## 2013-03-13 ENCOUNTER — Encounter (HOSPITAL_BASED_OUTPATIENT_CLINIC_OR_DEPARTMENT_OTHER): Payer: Self-pay | Admitting: Emergency Medicine

## 2013-03-13 DIAGNOSIS — S81009A Unspecified open wound, unspecified knee, initial encounter: Secondary | ICD-10-CM | POA: Insufficient documentation

## 2013-03-13 DIAGNOSIS — R296 Repeated falls: Secondary | ICD-10-CM | POA: Insufficient documentation

## 2013-03-13 DIAGNOSIS — IMO0002 Reserved for concepts with insufficient information to code with codable children: Secondary | ICD-10-CM | POA: Insufficient documentation

## 2013-03-13 DIAGNOSIS — I10 Essential (primary) hypertension: Secondary | ICD-10-CM | POA: Insufficient documentation

## 2013-03-13 DIAGNOSIS — Z3202 Encounter for pregnancy test, result negative: Secondary | ICD-10-CM | POA: Insufficient documentation

## 2013-03-13 DIAGNOSIS — Y929 Unspecified place or not applicable: Secondary | ICD-10-CM | POA: Insufficient documentation

## 2013-03-13 DIAGNOSIS — Z23 Encounter for immunization: Secondary | ICD-10-CM | POA: Insufficient documentation

## 2013-03-13 DIAGNOSIS — Z79899 Other long term (current) drug therapy: Secondary | ICD-10-CM | POA: Insufficient documentation

## 2013-03-13 DIAGNOSIS — J45909 Unspecified asthma, uncomplicated: Secondary | ICD-10-CM | POA: Insufficient documentation

## 2013-03-13 DIAGNOSIS — E663 Overweight: Secondary | ICD-10-CM | POA: Insufficient documentation

## 2013-03-13 DIAGNOSIS — Y93B9 Activity, other involving muscle strengthening exercises: Secondary | ICD-10-CM | POA: Insufficient documentation

## 2013-03-13 DIAGNOSIS — F319 Bipolar disorder, unspecified: Secondary | ICD-10-CM | POA: Insufficient documentation

## 2013-03-13 DIAGNOSIS — S81012A Laceration without foreign body, left knee, initial encounter: Secondary | ICD-10-CM

## 2013-03-13 MED ORDER — HYDROCODONE-ACETAMINOPHEN 5-325 MG PO TABS: 2.0000 | ORAL_TABLET | ORAL | Status: DC | PRN

## 2013-03-13 MED ORDER — TETANUS-DIPHTH-ACELL PERTUSSIS 5-2.5-18.5 LF-MCG/0.5 IM SUSP
0.5000 mL | Freq: Once | INTRAMUSCULAR | Status: AC
  Administered 2013-03-13: 0.5 mL via INTRAMUSCULAR
  Filled 2013-03-13: qty 0.5

## 2013-03-13 NOTE — ED Notes (Signed)
Fell while exercising, partial thickness laceration to left lower leg, just inferior to the knee.

## 2013-03-13 NOTE — ED Notes (Signed)
Suture cart is at the bedside set up and ready for the doctor to use. 

## 2013-03-13 NOTE — ED Provider Notes (Signed)
Medical screening examination/treatment/procedure(s) were performed by non-physician practitioner and as supervising physician I was immediately available for consultation/collaboration.  Doug Sou, MD 03/13/13 2322

## 2013-03-13 NOTE — ED Provider Notes (Signed)
CSN: [id number]     Arrival date & time 03/13/13  1347 History   First MD Initiated Contact with Patient 03/13/13 1408     Chief Complaint  Patient presents with  . Fall  . Extremity Laceration   (Consider location/radiation/quality/duration/timing/severity/associated sxs/prior Treatment) Patient is a 37 y.o. female presenting with knee pain. The history is provided by the patient. No language interpreter was used.  Knee Pain Location:  Knee Injury: yes   Knee location:  L knee Pain details:    Quality:  Aching   Radiates to:  Does not radiate   Severity:  Moderate   Onset quality:  Sudden   Timing:  Constant Chronicity:  New Dislocation: no   Foreign body present:  No foreign bodies Prior injury to area:  No Relieved by:  Nothing Worsened by:  Nothing tried Ineffective treatments:  None tried   Past Medical History  Diagnosis Date  . Hypertension   . Asthma     childhood  . Overweight(278.02)   . Elevated antinuclear antibody (ANA) level 05/12/2011  . Bipolar disorder    Past Surgical History  Procedure Laterality Date  . Salpingoophorectomy  2007    left   Family History  Problem Relation Age of Onset  . Arthritis Mother     osteoarthritis  . Diabetes Mother   . Hypertension Mother   . Mental illness Mother     schizophrenia  . Obesity Mother   . Schizophrenia Mother   . Hypertension Father   . Alcohol abuse    . Diabetes    . Hypertension     History  Substance Use Topics  . Smoking status: Never Smoker   . Smokeless tobacco: Never Used  . Alcohol Use: Yes     Comment: social, once a week   OB History   Grav Para Term Preterm Abortions TAB SAB Ect Mult Living                 Review of Systems  Musculoskeletal: Negative for joint swelling.  All other systems reviewed and are negative.    Allergies  Peanuts; Apple; and Vilazodone hcl  Home Medications   Current Outpatient Rx  Name  Route  Sig  Dispense  Refill  . albuterol (PROAIR  HFA) 108 (90 BASE) MCG/ACT inhaler   Inhalation   Inhale 2 puffs into the lungs every 6 (six) hours as needed. For wheezing         . ALPRAZolam (XANAX) 0.25 MG tablet   Oral   Take 1 tablet (0.25 mg total) by mouth at bedtime as needed for sleep.   30 tablet   0   . amLODipine (NORVASC) 10 MG tablet      TAKE 1 TABLET BY MOUTH DAILY   30 tablet   2   . cloNIDine (CATAPRES) 0.1 MG tablet      TAKE 1 TABLET BY MOUTH TWICE DAILY   60 tablet   3   . EPINEPHrine (EPIPEN IJ)   Injection   Inject as directed as needed.         Marland Kitchen FLUoxetine (PROZAC) 20 MG tablet   Oral   Take 1 tablet (20 mg total) by mouth daily.   30 tablet   2   . fluticasone (FLONASE) 50 MCG/ACT nasal spray   Nasal   Place 2 sprays into the nose daily.   16 g   6   . hydrochlorothiazide (HYDRODIURIL) 25 MG tablet  TAKE 1 TABLET BY MOUTH DAILY   30 tablet   2   . loratadine (CLARITIN) 10 MG tablet   Oral   Take 10 mg by mouth daily.         . mometasone (NASONEX) 50 MCG/ACT nasal spray   Nasal   Place 2 sprays into the nose daily.   7.5 g   0   . norethindrone-ethinyl estradiol (JUNEL FE,GILDESS FE,LOESTRIN FE) 1-20 MG-MCG tablet   Oral   Take 1 tablet by mouth daily.         . phentermine (ADIPEX-P) 37.5 MG tablet   Oral   Take 18.75 mg by mouth daily before breakfast.         . valACYclovir (VALTREX) 1000 MG tablet      TAKE 1 TABLET BY MOUTH EVERY DAY AS NEEDED   30 tablet   1    BP 137/88  Pulse 71  Temp(Src) 97.9 F (36.6 C) (Oral)  Resp 18  Ht 5\' 7"  (1.702 m)  Wt 285 lb (129.275 kg)  BMI 44.63 kg/m2  SpO2 100% Physical Exam  Nursing note and vitals reviewed. Constitutional: She appears well-developed and well-nourished.  HENT:  Head: Normocephalic.  Pulmonary/Chest: Effort normal.  Musculoskeletal: She exhibits tenderness.  5 cm laceration left leg belos knee  Neurological: She is alert.  Skin: Skin is warm.  Psychiatric: She has a normal mood and  affect.    ED Course  LACERATION REPAIR Date/Time: 03/13/2013 4:52 PM Performed by: Elson Areas Authorized by: Elson Areas Consent: Verbal consent not obtained. Risks and benefits: risks, benefits and alternatives were discussed Consent given by: patient Patient understanding: patient states understanding of the procedure being performed Required items: required blood products, implants, devices, and special equipment available Patient identity confirmed: verbally with patient Body area: lower extremity Laceration length: 5 cm Foreign bodies: no foreign bodies Tendon involvement: none Nerve involvement: none Anesthesia: local infiltration Local anesthetic: lidocaine 2% without epinephrine Preparation: Patient was prepped and draped in the usual sterile fashion. Irrigation solution: saline Amount of cleaning: standard Debridement: none Skin closure: 3-0 Prolene Number of sutures: 7 Technique: simple Approximation difficulty: simple Patient tolerance: Patient tolerated the procedure well with no immediate complications.   (including critical care time) Labs Review Labs Reviewed  PREGNANCY, URINE   Imaging Review No results found.  EKG Interpretation   None       MDM   1. Laceration of left knee without complication    Suture removal in 10 days    Elson Areas, PA-C 03/13/13 1656

## 2013-03-14 ENCOUNTER — Telehealth: Payer: Self-pay | Admitting: Family

## 2013-03-14 NOTE — Telephone Encounter (Signed)
Left message for patient to return my call.

## 2013-03-14 NOTE — Telephone Encounter (Signed)
Appointment scheduled for 03/23/13

## 2013-03-14 NOTE — Telephone Encounter (Signed)
Please call pt and arrange a follow up visit on 10/29 for suture removal.

## 2013-03-23 ENCOUNTER — Encounter: Payer: Self-pay | Admitting: Family

## 2013-03-23 ENCOUNTER — Ambulatory Visit (INDEPENDENT_AMBULATORY_CARE_PROVIDER_SITE_OTHER): Payer: BC Managed Care – PPO | Admitting: Family

## 2013-03-23 VITALS — BP 124/70 | HR 76 | Temp 98.8°F | Resp 16 | Ht 66.5 in

## 2013-03-23 DIAGNOSIS — IMO0002 Reserved for concepts with insufficient information to code with codable children: Secondary | ICD-10-CM

## 2013-03-23 DIAGNOSIS — S81802S Unspecified open wound, left lower leg, sequela: Secondary | ICD-10-CM

## 2013-03-23 DIAGNOSIS — S81802A Unspecified open wound, left lower leg, initial encounter: Secondary | ICD-10-CM | POA: Insufficient documentation

## 2013-03-23 MED ORDER — ALPRAZOLAM 0.25 MG PO TABS: 0.2500 mg | ORAL_TABLET | Freq: Every evening | ORAL | Status: DC | PRN

## 2013-03-23 NOTE — Progress Notes (Signed)
  Subjective:    Patient ID: Whitney Harmon, female    DOB: November 05, 1975, 37 y.o.   MRN: [id number]  HPI Whitney Harmon is a 37 y/o female who presents today for suture removal and medication refill.  Patient reports falling 10 days ago while walking on a trail.  Was seen in the ED and had sutures placed on 10/19.   Patient reports numbness to site, denies signs and symptoms of infection.   Review of Systems  Constitutional: Negative for fever.  Skin: Positive for wound.       Reports numbness overlying wound  Neurological: Positive for numbness.       Patient reports numbness 2 cm distal to wound.       Objective:   Physical Exam  Constitutional: She is oriented to person, place, and time. She appears well-developed and well-nourished.  Pulmonary/Chest: Effort normal.  Neurological: She is alert and oriented to person, place, and time.  Skin: There is erythema.  Slight erythema to wound located to left tibial tuberosity.  No drainage, no odor.  Sutures located laterally across tibial tuberosity measuring 4cm. 7 sutures in place.          Assessment & Plan:

## 2013-03-23 NOTE — Assessment & Plan Note (Signed)
Healing well.  7 sutures removed. Pt tolerated well.  Applied antibiotic ointment and dressing.

## 2013-03-23 NOTE — Patient Instructions (Signed)
Keep wound clean and dry.  Apply antibiotic ointment once daily. Call if you develop redness/drainage, or if wound does not continue to heal well.

## 2013-04-05 ENCOUNTER — Telehealth: Payer: Self-pay | Admitting: Family

## 2013-04-05 NOTE — Telephone Encounter (Signed)
Refill- amlodipine 10mg  tab. Take one tablet by mouth once daily. Qty 30 last fill 10.3.14

## 2013-04-06 MED ORDER — AMLODIPINE BESYLATE 10 MG PO TABS: ORAL_TABLET | ORAL | Status: DC

## 2013-04-06 NOTE — Telephone Encounter (Signed)
Refill sent.

## 2013-04-13 ENCOUNTER — Telehealth: Payer: Self-pay | Admitting: *Deleted

## 2013-04-13 MED ORDER — ALPRAZOLAM 0.25 MG PO TABS: 0.2500 mg | ORAL_TABLET | Freq: Three times a day (TID) | ORAL | Status: DC | PRN

## 2013-04-13 NOTE — Telephone Encounter (Signed)
Rx called to pharmacy voicemail and notified pt. 

## 2013-04-13 NOTE — Telephone Encounter (Signed)
Received message from pt that she is needing an early refill on xanax because we did not write the RX as Dr Evelene Croon had previously written it. Our directions were for once a day. Pt states Dr Evelene Croon wrote Rx for three times daily and pt states she takes  1 1/2 tablets a day. Pt is requesting rx with new directions or a different medication. Pt states she is unable to get an appointment with dr Evelene Croon until after the first of the year.  Please advise.

## 2013-04-13 NOTE — Telephone Encounter (Signed)
See rx pended below for 60 tabs.

## 2013-04-14 ENCOUNTER — Telehealth: Payer: Self-pay | Admitting: *Deleted

## 2013-04-14 NOTE — Telephone Encounter (Signed)
Received call from pt stating she thinks she has an insect bite on the inside bend of her arm. Has swollen area that is red and draining. Feels like swelling has improved today. Scheduled pt appt to be seen tomorrow at 8:45am with [name], NP.  Advised pt if symptoms worsen to seek care tonight otherwise keep appt tomorrow. Pt voices understanding.

## 2013-04-15 ENCOUNTER — Telehealth: Payer: Self-pay | Admitting: Family

## 2013-04-15 ENCOUNTER — Ambulatory Visit: Payer: BC Managed Care – PPO | Admitting: Family

## 2013-04-15 NOTE — Telephone Encounter (Signed)
Patient cancelled appointment this morning stating that her insect bite is "much better". She states that she will call us back if she feels like she needs to be seen.

## 2013-04-18 ENCOUNTER — Emergency Department (HOSPITAL_BASED_OUTPATIENT_CLINIC_OR_DEPARTMENT_OTHER)
Admission: EM | Admit: 2013-04-18 | Discharge: 2013-04-18 | Disposition: A | Discharge status: Home / Self Care | Payer: BC Managed Care – PPO | Attending: Emergency Medicine | Admitting: Emergency Medicine

## 2013-04-18 ENCOUNTER — Encounter (HOSPITAL_BASED_OUTPATIENT_CLINIC_OR_DEPARTMENT_OTHER): Payer: Self-pay | Admitting: Emergency Medicine

## 2013-04-18 ENCOUNTER — Ambulatory Visit: Payer: BC Managed Care – PPO | Admitting: Family

## 2013-04-18 DIAGNOSIS — J45909 Unspecified asthma, uncomplicated: Secondary | ICD-10-CM | POA: Insufficient documentation

## 2013-04-18 DIAGNOSIS — M545 Low back pain, unspecified: Secondary | ICD-10-CM | POA: Insufficient documentation

## 2013-04-18 DIAGNOSIS — IMO0002 Reserved for concepts with insufficient information to code with codable children: Secondary | ICD-10-CM | POA: Insufficient documentation

## 2013-04-18 DIAGNOSIS — F319 Bipolar disorder, unspecified: Secondary | ICD-10-CM | POA: Insufficient documentation

## 2013-04-18 DIAGNOSIS — E663 Overweight: Secondary | ICD-10-CM | POA: Insufficient documentation

## 2013-04-18 DIAGNOSIS — M549 Dorsalgia, unspecified: Secondary | ICD-10-CM

## 2013-04-18 DIAGNOSIS — Z79899 Other long term (current) drug therapy: Secondary | ICD-10-CM | POA: Insufficient documentation

## 2013-04-18 DIAGNOSIS — I10 Essential (primary) hypertension: Secondary | ICD-10-CM | POA: Insufficient documentation

## 2013-04-18 MED ORDER — CYCLOBENZAPRINE HCL 5 MG PO TABS: 5.0000 mg | ORAL_TABLET | Freq: Two times a day (BID) | ORAL | Status: DC | PRN

## 2013-04-18 MED ORDER — HYDROCODONE-ACETAMINOPHEN 5-325 MG PO TABS: 1.0000 | ORAL_TABLET | ORAL | Status: DC | PRN

## 2013-04-18 NOTE — ED Provider Notes (Signed)
CSN: [id number]     Arrival date & time 04/18/13  1107 History   First MD Initiated Contact with Patient 04/18/13 1111     Chief Complaint  Patient presents with  . Back Pain   (Consider location/radiation/quality/duration/timing/severity/associated sxs/prior Treatment) HPI Comments: Pt states that she has been having lower back pain since yesterday:pt denies any falls:pt states that she has a history of back problems, but she has not had it in a while:pt states that she has had  A lot of wt gain over the last year:no numbness, fever, dysuria, or weakness. Has tried ibuprofen without relief  The history is provided by the patient. No language interpreter was used.    Past Medical History  Diagnosis Date  . Hypertension   . Asthma     childhood  . Overweight(278.02)   . Elevated antinuclear antibody (ANA) level 05/12/2011  . Bipolar disorder    Past Surgical History  Procedure Laterality Date  . Salpingoophorectomy  2007    left   Family History  Problem Relation Age of Onset  . Arthritis Mother     osteoarthritis  . Diabetes Mother   . Hypertension Mother   . Mental illness Mother     schizophrenia  . Obesity Mother   . Schizophrenia Mother   . Hypertension Father   . Alcohol abuse    . Diabetes    . Hypertension     History  Substance Use Topics  . Smoking status: Never Smoker   . Smokeless tobacco: Never Used  . Alcohol Use: Yes     Comment: social, once a week   OB History   Grav Para Term Preterm Abortions TAB SAB Ect Mult Living                 Review of Systems  Constitutional: Negative.   Respiratory: Negative.   Cardiovascular: Negative.     Allergies  Peanuts; Apple; and Vilazodone hcl  Home Medications   Current Outpatient Rx  Name  Route  Sig  Dispense  Refill  . albuterol (PROAIR HFA) 108 (90 BASE) MCG/ACT inhaler   Inhalation   Inhale 2 puffs into the lungs every 6 (six) hours as needed. For wheezing         . ALPRAZolam (XANAX)  0.25 MG tablet   Oral   Take 1 tablet (0.25 mg total) by mouth 3 (three) times daily as needed for sleep.   60 tablet   0   . amLODipine (NORVASC) 10 MG tablet      TAKE 1 TABLET BY MOUTH DAILY   30 tablet   2   . cloNIDine (CATAPRES) 0.1 MG tablet      TAKE 1 TABLET BY MOUTH TWICE DAILY   60 tablet   3   . cyclobenzaprine (FLEXERIL) 5 MG tablet   Oral   Take 1 tablet (5 mg total) by mouth 2 (two) times daily as needed for muscle spasms.   20 tablet   0   . EPINEPHrine (EPIPEN IJ)   Injection   Inject as directed as needed.         Marland Kitchen FLUoxetine (PROZAC) 20 MG tablet   Oral   Take 1 tablet (20 mg total) by mouth daily.   30 tablet   2   . fluticasone (FLONASE) 50 MCG/ACT nasal spray   Nasal   Place 2 sprays into the nose daily.   16 g   6   . hydrochlorothiazide (HYDRODIURIL) 25 MG  tablet      TAKE 1 TABLET BY MOUTH DAILY   30 tablet   2   . HYDROcodone-acetaminophen (NORCO/VICODIN) 5-325 MG per tablet   Oral   Take 2 tablets by mouth every 4 (four) hours as needed for pain.   20 tablet   0   . HYDROcodone-acetaminophen (NORCO/VICODIN) 5-325 MG per tablet   Oral   Take 1-2 tablets by mouth every 4 (four) hours as needed.   15 tablet   0   . loratadine (CLARITIN) 10 MG tablet   Oral   Take 10 mg by mouth daily.         . mometasone (NASONEX) 50 MCG/ACT nasal spray   Nasal   Place 2 sprays into the nose daily.   7.5 g   0   . norethindrone-ethinyl estradiol (JUNEL FE,GILDESS FE,LOESTRIN FE) 1-20 MG-MCG tablet   Oral   Take 1 tablet by mouth daily.         . phentermine (ADIPEX-P) 37.5 MG tablet   Oral   Take 18.75 mg by mouth daily before breakfast.         . valACYclovir (VALTREX) 1000 MG tablet      TAKE 1 TABLET BY MOUTH EVERY DAY AS NEEDED   30 tablet   1    BP 151/92  Pulse 94  Temp(Src) 98.2 F (36.8 C) (Oral)  Resp 18  Ht 5\' 7"  (1.702 m)  Wt 295 lb (133.811 kg)  BMI 46.19 kg/m2  SpO2 97% Physical Exam  Nursing  note and vitals reviewed. Constitutional: She is oriented to person, place, and time. She appears well-developed and well-nourished.  Cardiovascular: Normal rate and regular rhythm.   Pulmonary/Chest: Effort normal and breath sounds normal.  Abdominal: Soft. Bowel sounds are normal. There is no tenderness.  Musculoskeletal: Normal range of motion.  Lumbar paraspinal tenderness:pt has full rom  Neurological: She is alert and oriented to person, place, and time. She exhibits normal muscle tone. Coordination normal.  Skin: Skin is warm and dry.  Psychiatric: She has a normal mood and affect.    ED Course  Procedures (including critical care time) Labs Review Labs Reviewed - No data to display Imaging Review No results found.  EKG Interpretation   None       MDM   1. Back pain    Pt is neurologically intact:will treat symptomatically and give referral back to pcp and Dr. Vivi Barrack, NP 04/18/13 1209

## 2013-04-18 NOTE — ED Notes (Signed)
2 days she has had lower back pain. She twisted wrong. Hx of back pain.

## 2013-04-19 NOTE — ED Provider Notes (Signed)
Medical screening examination/treatment/procedure(s) were performed by non-physician practitioner and as supervising physician I was immediately available for consultation/collaboration.  EKG Interpretation   None        Aniqua Briere E Christpoher Sievers, MD 04/19/13 1746 

## 2013-05-02 ENCOUNTER — Other Ambulatory Visit: Payer: Self-pay | Admitting: *Deleted

## 2013-05-02 ENCOUNTER — Telehealth: Payer: Self-pay | Admitting: Family

## 2013-05-02 MED ORDER — HYDROCHLOROTHIAZIDE 25 MG PO TABS: ORAL_TABLET | ORAL | Status: DC

## 2013-05-02 NOTE — Telephone Encounter (Signed)
Refill sent.  Due for OV please prior to additional refills.

## 2013-05-02 NOTE — Telephone Encounter (Signed)
hydrochlorizathide  25 mg take 1 per day   Patient is out

## 2013-05-02 NOTE — Telephone Encounter (Signed)
Refill hydrochlorothiazide 25 mg tab take 1 tablet by mouth 25mg  tab qty 30

## 2013-05-25 ENCOUNTER — Other Ambulatory Visit: Payer: Self-pay | Admitting: Family

## 2013-05-25 DIAGNOSIS — F419 Anxiety disorder, unspecified: Secondary | ICD-10-CM

## 2013-05-25 NOTE — Telephone Encounter (Signed)
Rx faxed to pharmacy/SLS 

## 2013-05-25 NOTE — Telephone Encounter (Signed)
Refill granted, printed and given to nursing staff for pick up or fax to pharmacy.

## 2013-05-25 NOTE — Telephone Encounter (Signed)
Refill request for Xanax Last filled by MD on - 04/13/13 #60 x0 Last Appt- 03/23/2013 Next Appt- 06/20/2013 Please advise refill?

## 2013-06-20 ENCOUNTER — Ambulatory Visit: Payer: BC Managed Care – PPO | Admitting: Family

## 2013-06-21 ENCOUNTER — Ambulatory Visit: Payer: BC Managed Care – PPO | Admitting: Family

## 2013-06-27 ENCOUNTER — Ambulatory Visit (INDEPENDENT_AMBULATORY_CARE_PROVIDER_SITE_OTHER): Payer: Managed Care, Other (non HMO) | Admitting: Family

## 2013-06-27 ENCOUNTER — Encounter: Payer: Self-pay | Admitting: Family

## 2013-06-27 VITALS — BP 118/72 | HR 65 | Temp 98.3°F | Ht 67.0 in | Wt 294.0 lb

## 2013-06-27 DIAGNOSIS — F329 Major depressive disorder, single episode, unspecified: Secondary | ICD-10-CM

## 2013-06-27 DIAGNOSIS — F419 Anxiety disorder, unspecified: Secondary | ICD-10-CM

## 2013-06-27 DIAGNOSIS — I10 Essential (primary) hypertension: Secondary | ICD-10-CM

## 2013-06-27 DIAGNOSIS — F341 Dysthymic disorder: Secondary | ICD-10-CM

## 2013-06-27 DIAGNOSIS — E669 Obesity, unspecified: Secondary | ICD-10-CM

## 2013-06-27 NOTE — Assessment & Plan Note (Signed)
BP stable on amlodipine, clonidine, hctz.  Continue same, obtain bmet.

## 2013-06-27 NOTE — Progress Notes (Signed)
Subjective:    Patient ID: Whitney Harmon, female    DOB: 02/07/1976, 38 y.o.   MRN: [id number]  HPI  Whitney Harmon is a 38 yr old female who presents today for follow up.  Anxiety/depression- currently maintained on prozac and prn xanax. She continues to see Dr. Evelene CroonKaur as well as a therapist. Reports a lot of stress currently.  Teenage son acting out. Taking 2 xanax at night prn sleep.   HTN-  currently maintained on amlodipine, clonidine, hctz.   BP Readings from Last 3 Encounters:  06/27/13 118/72  04/18/13 151/92  03/23/13 124/70   Obesity-  Reports + binge eating related to stress.  Wt Readings from Last 3 Encounters:  06/27/13 294 lb 0.6 oz (133.376 kg)  04/18/13 295 lb (133.811 kg)  03/13/13 285 lb (129.275 kg)    Review of Systems See HPI  Past Medical History  Diagnosis Date  . Hypertension   . Asthma     childhood  . Overweight   . Elevated antinuclear antibody (ANA) level 05/12/2011  . Bipolar disorder     History   Social History  . Marital Status: Single    Spouse Name: N/A    Number of Children: 1  . 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 Smoking status: Never Smoker   . Smokeless tobacco: Never Used  . Alcohol Use: Yes     Comment: social, once a week  . Drug Use: No  . Sexual Activity: Not on file   Other Topics Concern  . 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 Salpingoophorectomy  2007    left    Family History  Problem Relation Age of Onset  . Arthritis Mother     osteoarthritis  . Diabetes Mother   . Hypertension Mother   . Mental illness Mother     schizophrenia  . Obesity Mother   . Schizophrenia Mother   . Hypertension Father   . Alcohol abuse    . Diabetes    . Hypertension      Allergies  Allergen Reactions  . Peanuts [Peanut Oil] Itching    Has Epi pen,  though states only has itching/hives  . Apple Hives and Itching    Apples  . Vilazodone Hcl Diarrhea    Current Outpatient Prescriptions on File Prior to Visit  Medication Sig Dispense Refill  . albuterol (PROAIR HFA) 108 (90 BASE) MCG/ACT inhaler Inhale 2 puffs into the lungs every 6 (six) hours as needed. For wheezing      . ALPRAZolam (XANAX) 0.25 MG tablet TAKE ONE TABLET BY MOUTH THREE TIMES DAILY AS NEEDED FOR ANXIETY OR SLEEP  60 tablet  0  . amLODipine (NORVASC) 10 MG tablet TAKE 1 TABLET BY MOUTH DAILY  30 tablet  2  . cloNIDine (CATAPRES) 0.1 MG tablet TAKE 1 TABLET BY MOUTH TWICE DAILY  60 tablet  3  . cyclobenzaprine (FLEXERIL) 5 MG tablet Take 1 tablet (5 mg total) by mouth 2 (two) times daily as needed for muscle spasms.  20 tablet  0  . EPINEPHrine (EPIPEN IJ) Inject as directed as needed.      Marland Kitchen. FLUoxetine (PROZAC) 20 MG tablet Take 1 tablet (20 mg total) by mouth daily.  30 tablet  2  .  fluticasone (FLONASE) 50 MCG/ACT nasal spray Place 2 sprays into the nose daily.  16 g  6  . hydrochlorothiazide (HYDRODIURIL) 25 MG tablet TAKE 1 TABLET BY MOUTH DAILY  30 tablet  3  . HYDROcodone-acetaminophen (NORCO/VICODIN) 5-325 MG per tablet Take 2 tablets by mouth every 4 (four) hours as needed for pain.  20 tablet  0  . loratadine (CLARITIN) 10 MG tablet Take 10 mg by mouth daily.      . mometasone (NASONEX) 50 MCG/ACT nasal spray Place 2 sprays into the nose daily.  7.5 g  0  . norethindrone-ethinyl estradiol (JUNEL FE,GILDESS FE,LOESTRIN FE) 1-20 MG-MCG tablet Take 1 tablet by mouth daily.      . valACYclovir (VALTREX) 1000 MG tablet TAKE 1 TABLET BY MOUTH EVERY DAY AS NEEDED  30 tablet  1   No current facility-administered medications on file prior to visit.    BP 118/72  Pulse 65  Temp(Src) 98.3 F (36.8 C) (Oral)  Ht 5\' 7"  (1.702 m)  Wt 294 lb 0.6 oz (133.376 kg)  BMI 46.04 kg/m2  SpO2 97%       Objective:   Physical Exam  Constitutional: She is oriented to person,  place, and time. She appears well-developed and well-nourished. No distress.  HENT:  Head: Normocephalic and atraumatic.  Cardiovascular: Normal rate and regular rhythm.   No murmur heard. Neurological: She is alert and oriented to person, place, and time.  Psychiatric: She has a normal mood and affect. Her behavior is normal. Thought content normal.          Assessment & Plan:

## 2013-06-27 NOTE — Assessment & Plan Note (Signed)
Reports + binge eating. She is scheduled to see Dr[facility]ern[facility] at [facility] to discuss gastric sleeve procedure.

## 2013-06-27 NOTE — Patient Instructions (Signed)
Please return for lab work at your earliest convenience. Follow up in 5 months.

## 2013-06-27 NOTE — Progress Notes (Signed)
Pre visit review using our clinic review tool, if applicable. No additional management support is needed unless otherwise documented below in the visit note. 

## 2013-06-27 NOTE — Assessment & Plan Note (Signed)
Fair control on current meds. Continue same.  

## 2013-06-28 ENCOUNTER — Telehealth: Payer: Self-pay | Admitting: Family

## 2013-06-28 NOTE — Telephone Encounter (Signed)
Relevant patient education assigned to patient using Emmi. ° °

## 2013-07-06 ENCOUNTER — Other Ambulatory Visit: Payer: Self-pay | Admitting: Physician Assistant

## 2013-07-06 NOTE — Telephone Encounter (Signed)
Rx request phoned to pharmacy/SLS  

## 2013-07-06 NOTE — Telephone Encounter (Signed)
OK to send 90 tabs zero refills.  

## 2013-07-06 NOTE — Telephone Encounter (Signed)
eScribe request from [facility] for refill on Alprazolam Last filled - 12.31.14, #60x0 Last AEX - 02.02.15 Next AEX - 5 Months Please Advise/SLS

## 2013-07-20 ENCOUNTER — Telehealth: Payer: Self-pay | Admitting: *Deleted

## 2013-07-20 MED ORDER — VALACYCLOVIR HCL 1 G PO TABS: ORAL_TABLET | ORAL | Status: DC

## 2013-07-20 NOTE — Telephone Encounter (Signed)
Received message from pt requesting refill of valtrex to [facility] on [address].  Refill sent.

## 2013-07-30 ENCOUNTER — Other Ambulatory Visit: Payer: Self-pay | Admitting: Family

## 2013-08-01 NOTE — Telephone Encounter (Signed)
OK to send 40 tabs, no refills.

## 2013-08-01 NOTE — Telephone Encounter (Signed)
Pt last seen in February and has follow up in August. Last rx provided. 07/06/13.  Please advise.  Medication name:  Name from pharmacy:  ALPRAZolam (XANAX) 0.25 MG tablet  ALPRAZolam 0.25MG  TAB Sig: TAKE ONE TABLET BY MOUTH THREE TIMES DAILY AS NEEDED FOR ANXIETY AND SLEEP Dispense: 30 tablet Refills: 0 Start: 07/30/2013 Class: Normal Requested on: 07/06/2013 Originally ordered on: 07/26/2010 Last refill: 07/06/2013

## 2013-08-02 NOTE — Telephone Encounter (Signed)
Rx called to pharmacy voicemail. 

## 2013-08-11 ENCOUNTER — Other Ambulatory Visit: Payer: Self-pay | Admitting: Family

## 2013-08-12 NOTE — Telephone Encounter (Signed)
Rx request to pharmacy/[facility]  

## 2013-08-15 ENCOUNTER — Telehealth: Payer: Self-pay | Admitting: Family

## 2013-08-15 MED ORDER — CLONIDINE HCL 0.1 MG PO TABS: ORAL_TABLET | ORAL | Status: DC

## 2013-08-15 NOTE — Telephone Encounter (Signed)
Refill clonidine

## 2013-08-19 ENCOUNTER — Telehealth: Payer: Self-pay | Admitting: Family

## 2013-08-19 NOTE — Telephone Encounter (Signed)
Patient left message requesting advice, she has a sore throat, chest congestion and a cough. Feels like these are the same symptoms she gets often, she will use the albuterol she has but wants to know if she should be seen or if she needs to take additional medication, please advise

## 2013-08-20 ENCOUNTER — Encounter: Payer: Self-pay | Admitting: Family Medicine

## 2013-08-20 ENCOUNTER — Ambulatory Visit (INDEPENDENT_AMBULATORY_CARE_PROVIDER_SITE_OTHER): Payer: Managed Care, Other (non HMO) | Admitting: Family Medicine

## 2013-08-20 VITALS — BP 122/80 | HR 88 | Temp 99.3°F | Resp 20 | Ht 67.0 in | Wt 294.0 lb

## 2013-08-20 DIAGNOSIS — J209 Acute bronchitis, unspecified: Secondary | ICD-10-CM

## 2013-08-20 MED ORDER — PREDNISONE 10 MG PO TABS: ORAL_TABLET | ORAL | Status: DC

## 2013-08-20 MED ORDER — ALBUTEROL SULFATE HFA 108 (90 BASE) MCG/ACT IN AERS: 2.0000 | INHALATION_SPRAY | Freq: Four times a day (QID) | RESPIRATORY_TRACT | Status: DC | PRN

## 2013-08-20 MED ORDER — AZITHROMYCIN 250 MG PO TABS: ORAL_TABLET | ORAL | Status: DC

## 2013-08-20 NOTE — Progress Notes (Signed)
Pre-visit discussion using our clinic review tool. No additional management support is needed unless otherwise documented below in the visit note.  

## 2013-08-20 NOTE — Patient Instructions (Signed)

## 2013-08-20 NOTE — Progress Notes (Signed)
  Subjective:     Manuela Schwartzasha L Fruth is a 38 y.o. female here for evaluation of a cough. Onset of symptoms was 7 days ago. Symptoms have been gradually worsening since that time. The cough is productive and is aggravated by infection and stress. Associated symptoms include: shortness of breath, sputum production and wheezing. Patient does have a history of asthma. Patient does have a history of environmental allergens. Patient has not traveled recently. Patient does not have a history of smoking. Patient has not had a previous chest x-ray. Patient has not had a PPD done.  The following portions of the patient's history were reviewed and updated as appropriate: allergies, current medications, past family history, past medical history, past social history, past surgical history and problem list.  Review of Systems Pertinent items are noted in HPI.    Objective:    Oxygen saturation 99% on room air BP 122/80  Pulse 88  Temp(Src) 99.3 F (37.4 C) (Oral)  Resp 20  Ht 5\' 7"  (1.702 m)  Wt 294 lb (133.358 kg)  BMI 46.04 kg/m2  SpO2 99% General appearance: alert, cooperative and mild distress Nose: Nares normal. Septum midline. Mucosa normal. No drainage or sinus tenderness. Throat: lips, mucosa, and tongue normal; teeth and gums normal Neck: no adenopathy, supple, symmetrical, trachea midline and thyroid not enlarged, symmetric, no tenderness/mass/nodules Lungs: diminished breath sounds bibasilar Heart: S1, S2 normal    Assessment:    Acute Bronchitis    Plan:    Antibiotics per medication orders. Avoid exposure to tobacco smoke and fumes. Call if shortness of breath worsens, blood in sputum, change in character of cough, development of fever or chills, inability to maintain nutrition and hydration. Avoid exposure to tobacco smoke and fumes. f/u prn

## 2013-08-30 ENCOUNTER — Encounter: Payer: Self-pay | Admitting: Family

## 2013-08-30 LAB — BASIC METABOLIC PANEL
BUN: 11 mg/dL (ref 6–23)
CO2: 28 mEq/L (ref 19–32)
Calcium: 9.3 mg/dL (ref 8.4–10.5)
Chloride: 101 mEq/L (ref 96–112)
Creat: 0.81 mg/dL (ref 0.50–1.10)
Glucose, Bld: 96 mg/dL (ref 70–99)
POTASSIUM: 3.7 meq/L (ref 3.5–5.3)
Sodium: 141 mEq/L (ref 135–145)

## 2013-08-31 ENCOUNTER — Telehealth: Payer: Self-pay | Admitting: *Deleted

## 2013-08-31 NOTE — Telephone Encounter (Signed)
I will look at the form, however, depending on what the form requires, I may need to have her complete cpx with us prior to completion.

## 2013-08-31 NOTE — Telephone Encounter (Signed)
Pt dropped off health examination certificate to be completed for school. Spoke with pt. She states she had a CPE with [facility] in 12/2012 but has been unable to get through to them to see if they would complete form. Need form as soon as possible and states she has the print out of her 12/2012 cpe and TB skin test that she can bring by the office if we can use that to complete the form?  Please advise.

## 2013-09-05 ENCOUNTER — Other Ambulatory Visit: Payer: Self-pay | Admitting: Family

## 2013-09-05 NOTE — Telephone Encounter (Signed)
Spoke with pt, she states she was able to get previous CPE and PPD documentation and will drop it off to us this week as she is unable to fax the information to us. Awaiting documentation.

## 2013-09-06 NOTE — Telephone Encounter (Signed)
OK to send 40 tabs, zero refills.

## 2013-09-06 NOTE — Telephone Encounter (Signed)
Received CPE documentation from 12/24/12 ([facility]gent [facility]) and forwarded to Provider. Please advise.

## 2013-09-06 NOTE — Telephone Encounter (Signed)
Please advise:  Medication name:  Name from pharmacy:  ALPRAZolam (XANAX) 0.25 MG tablet  ALPRAZolam 0.25MG  TAB Sig: TAKE ONE TABLET BY MOUTH THREE TIMES DAILY AS NEEDED FOR ANXIETY AND SLEEP Dispense: 40 tablet Refills: 0 Start: 09/05/2013 Class: Normal Requested on: 08/02/2013 Originally ordered on: 07/26/2010 Last refill: 08/02/2013

## 2013-09-06 NOTE — Telephone Encounter (Signed)
Documentation shows PPD placement but does not show that pt returned to have PPD read.  Please ask pt to return for PPD placement.  I will sign form after she completes PPD.

## 2013-09-06 NOTE — Telephone Encounter (Signed)
Left message for pt to return my call.

## 2013-09-07 NOTE — Telephone Encounter (Signed)
Rx called to pharmacy voicemail. 

## 2013-09-07 NOTE — Telephone Encounter (Signed)
Spoke with pt. After further review of consent form that was faxed to us. Result is documented between the signatures on the consent form. Pt states she needs form ASAP. Please advise when form is completed.

## 2013-09-09 NOTE — Telephone Encounter (Signed)
Form faxed to [employer] at [phone] at 7:22am.

## 2013-09-09 NOTE — Telephone Encounter (Signed)
Form signed.

## 2013-09-19 ENCOUNTER — Telehealth: Payer: Self-pay | Admitting: Family

## 2013-09-19 NOTE — Telephone Encounter (Signed)
Form printed and faxed to below #.

## 2013-09-19 NOTE — Telephone Encounter (Signed)
States work paperwork needs to be faxed to [employer] at [phone], it was originally faxed to [employer]

## 2013-10-14 ENCOUNTER — Other Ambulatory Visit: Payer: Self-pay | Admitting: Family

## 2013-10-23 ENCOUNTER — Other Ambulatory Visit: Payer: Self-pay | Admitting: Family

## 2013-11-07 ENCOUNTER — Encounter: Payer: Self-pay | Admitting: Family

## 2013-11-07 NOTE — Telephone Encounter (Signed)
error 

## 2013-11-16 ENCOUNTER — Other Ambulatory Visit: Payer: Self-pay | Admitting: Family

## 2013-11-17 NOTE — Telephone Encounter (Signed)
OK to send 40 tabs zero refills 

## 2013-11-21 ENCOUNTER — Telehealth: Payer: Self-pay | Admitting: Family

## 2013-11-21 NOTE — Telephone Encounter (Signed)
Refill called to pharmacy voicemail per 11/16/13 refill note. Left message for pt to return my call.

## 2013-11-21 NOTE — Telephone Encounter (Signed)
Requesting refill on ALPRAZolam (XANAX) 0.25 MG tablet, pt states she has left a message and pharmacy has faxed over refill request all last week and has yet to hear something. Pt is requesting call back.

## 2013-11-21 NOTE — Telephone Encounter (Signed)
Rx called to pharmacy voicemail. 

## 2013-12-26 ENCOUNTER — Encounter: Payer: Self-pay | Admitting: Family

## 2013-12-26 ENCOUNTER — Ambulatory Visit: Payer: Managed Care, Other (non HMO) | Admitting: Family

## 2013-12-26 DIAGNOSIS — F429 Obsessive-compulsive disorder, unspecified: Secondary | ICD-10-CM | POA: Insufficient documentation

## 2014-01-02 ENCOUNTER — Ambulatory Visit: Payer: Managed Care, Other (non HMO) | Admitting: Family

## 2014-01-11 ENCOUNTER — Telehealth: Payer: Self-pay

## 2014-01-11 NOTE — Telephone Encounter (Signed)
I have never seen nor examined the patient.  The only fluid pill I see that she takes is HCTZ which also affects BP.  I would not recommend she double medication without seeing us first and having BP checked because doubling the medication may cause unsafe drop in BP and subsequent lightheadedness, dizziness or syncope.

## 2014-01-11 NOTE — Telephone Encounter (Signed)
Answering for Melissa who is out of office.  Recommend if she has increased swelling, she come in for evaluation.

## 2014-01-11 NOTE — Telephone Encounter (Signed)
Spoke with pt, she is aware. "She stated that she's in the process of moving to [place] and the only time she is down here is on the weekend. She wants to know if she could take two of the water pills to make the swelling decrease? She stated that she is hardly going to the bathroom to urinate anyway". LDM

## 2014-01-11 NOTE — Telephone Encounter (Signed)
Pt stated that "The last couple of weeks she has had swelling in her feet. Lately feet are swollen and its been hard to put on her shoes. Her feet are tender to the touch. She wanted to know if she need a stronger fluid pill?" LDM

## 2014-01-14 ENCOUNTER — Ambulatory Visit (INDEPENDENT_AMBULATORY_CARE_PROVIDER_SITE_OTHER): Payer: BC Managed Care – PPO | Admitting: Family Medicine

## 2014-01-14 ENCOUNTER — Encounter: Payer: Self-pay | Admitting: Family Medicine

## 2014-01-14 VITALS — BP 130/74 | HR 76 | Temp 98.5°F | Wt 309.0 lb

## 2014-01-14 DIAGNOSIS — I10 Essential (primary) hypertension: Secondary | ICD-10-CM

## 2014-01-14 DIAGNOSIS — R600 Localized edema: Secondary | ICD-10-CM

## 2014-01-14 DIAGNOSIS — R609 Edema, unspecified: Secondary | ICD-10-CM

## 2014-01-14 NOTE — Progress Notes (Signed)
Pre visit review using our clinic review tool, if applicable. No additional management support is needed unless otherwise documented below in the visit note. 

## 2014-01-14 NOTE — Progress Notes (Signed)
Subjective:   Patient ID: Whitney Harmon, female    DOB: [birth year], 38 y.o.   MRN: [id number]  Whitney Harmon is a pleasant 38 y.o. year old  female new to me who presents to weekend clinic today with Foot Swelling  on 01/14/2014  HPI:  Bilateral edema of feet/LE- started approximately two weeks ago. Only new rx was trazadone added by psychiatrist for insomnia. Symptoms improved in the morning when she first wakes up.  On her feet all day- usually gets progressively worse throughout the day.  Has gained weight recently.  She is the heaviest she has been.  Wt Readings from Last 3 Encounters:  01/14/14 309 lb (140.161 kg)  08/20/13 294 lb (133.358 kg)  06/27/13 294 lb 0.6 oz (133.376 kg)   She does take HCTZ 25 mg daily along with Norvasc and Clonidine. Has been on these rx for years.  No CP Sometimes has DOE but she thinks that is from her weight gain.  Current Outpatient Prescriptions on File Prior to Visit  Medication Sig Dispense Refill  . albuterol (PROAIR HFA) 108 (90 BASE) MCG/ACT inhaler Inhale 2 puffs into the lungs every 6 (six) hours as needed. For wheezing  1 Inhaler  2  . ALPRAZolam (XANAX) 0.25 MG tablet TAKE ONE TABLET BY MOUTH THREE TIMES DAILY AS NEEDED FOR ANXIETY OR SLEEP  40 tablet  0  . amLODipine (NORVASC) 10 MG tablet TAKE ONE TABLET BY MOUTH ONCE DAILY  30 tablet  3  . azithromycin (ZITHROMAX Z-PAK) 250 MG tablet As directed  6 each  0  . cloNIDine (CATAPRES) 0.1 MG tablet TAKE 1 TABLET BY MOUTH TWICE DAILY  60 tablet  5  . cyclobenzaprine (FLEXERIL) 5 MG tablet Take 1 tablet (5 mg total) by mouth 2 (two) times daily as needed for muscle spasms.  20 tablet  0  . EPINEPHrine (EPIPEN IJ) Inject as directed as needed.      Marland Kitchen FLUoxetine (PROZAC) 20 MG tablet Take 1 tablet (20 mg total) by mouth daily.  30 tablet  2  . hydrochlorothiazide (HYDRODIURIL) 25 MG tablet TAKE ONE TABLET BY MOUTH ONCE DAILY  30 tablet  3  . HYDROcodone-acetaminophen  (NORCO/VICODIN) 5-325 MG per tablet Take 2 tablets by mouth every 4 (four) hours as needed for pain.  20 tablet  0  . loratadine (CLARITIN) 10 MG tablet Take 10 mg by mouth daily.      . norethindrone-ethinyl estradiol (JUNEL FE,GILDESS FE,LOESTRIN FE) 1-20 MG-MCG tablet Take 1 tablet by mouth daily.      . predniSONE (DELTASONE) 10 MG tablet 3 po qd for 3 days then 2 po qd for 3 days the 1 po qd for 3 days  18 tablet  0  . valACYclovir (VALTREX) 1000 MG tablet TAKE 1 TABLET BY MOUTH EVERY DAY AS NEEDED  30 tablet  0   No current facility-administered medications on file prior to visit.    Allergies  Allergen Reactions  . Fish Oil Anaphylaxis  . Peanut Butter Flavor Anaphylaxis  . Peanuts [Peanut Oil] Itching    Has Epi pen, though states only has itching/hives  . Apple Hives and Itching    Apples  . Vilazodone Hcl Diarrhea    Past Medical History  Diagnosis Date  . Hypertension   . Asthma     childhood  . Overweight(278.02)   . Elevated antinuclear antibody (ANA) level 05/12/2011  . Bipolar disorder     Past Surgical History  Procedure  Laterality Date  . Salpingoophorectomy  2007    left    Family History  Problem Relation Age of Onset  . Arthritis Mother     osteoarthritis  . Diabetes Mother   . Hypertension Mother   . Mental illness Mother     schizophrenia  . Obesity Mother   . Schizophrenia Mother   . Hypertension Father   . Alcohol abuse    . Diabetes    . Hypertension      History   Social History  . Marital Status: Single    Spouse Name: N/A    Number of Children: 1  . 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 Smoking status: Never Smoker   . Smokeless tobacco: Never Used  . Alcohol Use: Yes     Comment: social, once a week  . Drug Use: No  . Sexual Activity: Not on file   Other Topics Concern  . 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    Review of Systems    See HPI No redness or weeping from legs  Objective:    BP 130/74  Pulse 76  Temp(Src) 98.5 F (36.9 C) (Oral)  Wt 309 lb (140.161 kg)  SpO2 98%  LMP 12/14/2013   Physical Exam  Gen:  Obese, alert, NAD Resp:  CTA bilaterally CVS:  RRR Ext:  Thick ankles but no pitting edema      Assessment & Plan:   Bilateral leg edema  HYPERTENSION No Follow-up on file.

## 2014-01-14 NOTE — Patient Instructions (Signed)
Nice to meet you. Please cut your amlodipine tablet in half and take 5 mg daily- follow up with your provider next week.

## 2014-01-14 NOTE — Assessment & Plan Note (Signed)
New- not present on exam today but she just woke up. Likely caused by weight gain and could be exacerbated by her medications.  Has been normotensive.  Advised to decrease her amlodipine to 5 mg daily and follow up with PCP on Monday. The patient indicates understanding of these issues and agrees with the plan.

## 2014-06-02 ENCOUNTER — Other Ambulatory Visit: Payer: Self-pay | Admitting: Family

## 2014-06-15 ENCOUNTER — Other Ambulatory Visit: Payer: Self-pay | Admitting: Family

## 2014-06-15 NOTE — Telephone Encounter (Signed)
She was instructed to follow up in 1 week after seeing Dr. Dayton MartesAron. Chart also notes that pt was moving to [place].  I will give a 7 day supply only.

## 2014-06-15 NOTE — Telephone Encounter (Signed)
Pt last seen by PCP 06/2013, saw Dr Laury AxonLowne 07/2013 and Dr Dayton MartesAron in 12/2013.  Please advise Clonidine refill?

## 2014-08-28 ENCOUNTER — Telehealth: Payer: Self-pay | Admitting: *Deleted

## 2014-08-28 ENCOUNTER — Encounter: Payer: Self-pay | Admitting: Family

## 2014-08-28 ENCOUNTER — Ambulatory Visit (INDEPENDENT_AMBULATORY_CARE_PROVIDER_SITE_OTHER): Payer: BC Managed Care – PPO | Admitting: Family

## 2014-08-28 VITALS — BP 140/82 | HR 72 | Temp 98.3°F | Resp 18 | Ht 66.5 in | Wt 314.2 lb

## 2014-08-28 DIAGNOSIS — F418 Other specified anxiety disorders: Secondary | ICD-10-CM

## 2014-08-28 DIAGNOSIS — I10 Essential (primary) hypertension: Secondary | ICD-10-CM | POA: Diagnosis not present

## 2014-08-28 DIAGNOSIS — F329 Major depressive disorder, single episode, unspecified: Secondary | ICD-10-CM

## 2014-08-28 DIAGNOSIS — E669 Obesity, unspecified: Secondary | ICD-10-CM

## 2014-08-28 DIAGNOSIS — R768 Other specified abnormal immunological findings in serum: Secondary | ICD-10-CM | POA: Diagnosis not present

## 2014-08-28 DIAGNOSIS — F419 Anxiety disorder, unspecified: Secondary | ICD-10-CM

## 2014-08-28 MED ORDER — CARVEDILOL 6.25 MG PO TABS: 6.2500 mg | ORAL_TABLET | Freq: Two times a day (BID) | ORAL | Status: DC

## 2014-08-28 MED ORDER — CLONIDINE HCL 0.1 MG PO TABS: 0.1000 mg | ORAL_TABLET | Freq: Two times a day (BID) | ORAL | Status: DC

## 2014-08-28 MED ORDER — HYDROCHLOROTHIAZIDE 25 MG PO TABS: 25.0000 mg | ORAL_TABLET | Freq: Every day | ORAL | Status: DC

## 2014-08-28 NOTE — Progress Notes (Signed)
Subjective:    Patient ID: Whitney Harmon, female    DOB: 01/05/1976, 39 y.o.   MRN: [id number]  HPI  Whitney Harmon is a 39 yr old female is a 39 yr old female who presents today for follow up.   Patient is currently maintained on the following medications for blood pressure: carvedilol, clonidine- not taking hctz and amlodipine.  Has only been taking carvedilol once daily.   Patient reports good compliance with blood pressure medications. Patient denies chest pain, shortness of breath or swelling. 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  In school currently. Reports that she continues to see a therapist and working with Dr. Evelene CroonKaur (psych), reports that she is feeling better.    She has re-established with Dr. Lily PeerFernandez, to work on bariatric clinic. Wants to proceed with gastric sleep during the summer she hopes.    She saw a rheumatologist in [place] and was told that she has either lupus or fibromyalgia.   Review of Systems Past Medical History  Diagnosis Date  . Hypertension   . Asthma     childhood  . Overweight(278.02)   . Elevated antinuclear antibody (ANA) level 05/12/2011  . Bipolar disorder     History   Social History  . Marital Status: Single    Spouse Name: N/A  . Number of Children: 1  . 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 Smoking status: Never Smoker   . Smokeless tobacco: Never Used  . Alcohol Use: Yes     Comment: social, once a week  . Drug Use: No  . Sexual Activity: Not on file   Other Topics Concern  . 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 Salpingoophorectomy  2007    left    Family History  Problem Relation  Age of Onset  . Arthritis Mother     osteoarthritis  . Diabetes Mother   . Hypertension Mother   . Mental illness Mother     schizophrenia  . Obesity Mother   . Schizophrenia Mother   . Hypertension Father   . Alcohol abuse    . Diabetes    . Hypertension      Allergies  Allergen Reactions  . Fish Oil Anaphylaxis  . Peanut Butter Flavor Anaphylaxis  . Peanuts [Peanut Oil] Itching    Has Epi pen, though states only has itching/hives  . Apple Hives and Itching    Apples  . Vilazodone Hcl Diarrhea    Current Outpatient Prescriptions on File Prior to Visit  Medication Sig Dispense Refill  . albuterol (PROAIR HFA) 108 (90 BASE) MCG/ACT inhaler Inhale 2 puffs into the lungs every 6 (six) hours as needed. For wheezing 1 Inhaler 2  . ALPRAZolam (XANAX) 0.25 MG tablet TAKE ONE TABLET BY MOUTH THREE TIMES DAILY AS NEEDED FOR ANXIETY OR SLEEP 40 tablet 0  . cyclobenzaprine (FLEXERIL) 5 MG tablet Take 1 tablet (5 mg total) by mouth 2 (two) times daily as needed for muscle spasms. 20 tablet 0  . EPINEPHrine (EPIPEN IJ) Inject as directed as needed.    Marland Kitchen. FLUoxetine (  PROZAC) 20 MG tablet Take 1 tablet (20 mg total) by mouth daily. (Patient taking differently: Take 60 mg by mouth daily. ) 30 tablet 2  . loratadine (CLARITIN) 10 MG tablet Take 10 mg by mouth daily as needed.     . norethindrone-ethinyl estradiol (JUNEL FE,GILDESS FE,LOESTRIN FE) 1-20 MG-MCG tablet Take 1 tablet by mouth daily.     No current facility-administered medications on file prior to visit.    BP 140/82 mmHg  Pulse 72  Temp(Src) 98.3 F (36.8 C) (Oral)  Resp 18  Ht 5' 6.5" (1.689 m)  Wt 314 lb 3.2 oz (142.52 kg)  BMI 49.96 kg/m2  SpO2 98%  LMP 06/29/2014       Objective:   Physical Exam        Assessment & Plan:

## 2014-08-28 NOTE — Patient Instructions (Signed)
Restart HCTZ. Complete lab work prior to leaving. Schedule a complete physical at the front desk.

## 2014-08-28 NOTE — Telephone Encounter (Signed)
Signed [facility] record [facility]. Forwarded to [place] to fax/email to medical records. JG//CMA

## 2014-08-29 ENCOUNTER — Encounter: Payer: Self-pay | Admitting: Family

## 2014-08-29 DIAGNOSIS — R768 Other specified abnormal immunological findings in serum: Secondary | ICD-10-CM | POA: Insufficient documentation

## 2014-08-29 DIAGNOSIS — R7689 Other specified abnormal immunological findings in serum: Secondary | ICD-10-CM | POA: Insufficient documentation

## 2014-08-29 LAB — BASIC METABOLIC PANEL
BUN: 11 mg/dL (ref 6–23)
CO2: 26 meq/L (ref 19–32)
Calcium: 9.2 mg/dL (ref 8.4–10.5)
Chloride: 101 mEq/L (ref 96–112)
Creatinine, Ser: 0.82 mg/dL (ref 0.40–1.20)
GFR: 101.25 mL/min (ref >60.00)
Glucose, Bld: 75 mg/dL (ref 70–99)
Potassium: 3.7 mEq/L (ref 3.5–5.1)
Sodium: 135 mEq/L (ref 135–145)

## 2014-08-29 NOTE — Assessment & Plan Note (Signed)
BP up some today.  Resume hctz. I don't thinks she needs amlodipine, continue carvedilol and clonidine.

## 2014-08-29 NOTE — Assessment & Plan Note (Signed)
Stable, working with a therapist and psychiatry, Dr. Evelene CroonKaur.

## 2014-08-29 NOTE — Assessment & Plan Note (Signed)
Will refer back to Dr. Dierdre ForthBeekman who she has seen in the past for further evaluation.

## 2014-08-29 NOTE — Assessment & Plan Note (Signed)
Planning for gastric sleeve this summer with Dr. Lily PeerFernandez.

## 2014-08-31 ENCOUNTER — Encounter: Payer: Self-pay | Admitting: Family

## 2014-09-27 ENCOUNTER — Encounter: Payer: BC Managed Care – PPO | Admitting: Family

## 2014-10-31 ENCOUNTER — Telehealth: Payer: Self-pay | Admitting: Family

## 2014-10-31 NOTE — Telephone Encounter (Signed)
Pre Visit letter sent  °

## 2014-11-20 ENCOUNTER — Encounter: Payer: BC Managed Care – PPO | Admitting: Family

## 2014-11-28 ENCOUNTER — Telehealth: Payer: Self-pay | Admitting: Family

## 2014-11-28 NOTE — Telephone Encounter (Signed)
pre visit letter mailed 11/24/14 °

## 2014-11-30 ENCOUNTER — Telehealth: Payer: Self-pay | Admitting: Family

## 2014-11-30 DIAGNOSIS — R768 Other specified abnormal immunological findings in serum: Secondary | ICD-10-CM

## 2014-11-30 NOTE — Telephone Encounter (Signed)
Her rheumatologist has retired.  She needs a new referral to Dr Consuella Loseevoshwar.

## 2014-12-10 IMAGING — CR DG KNEE COMPLETE 4+V*L*
4 series · 4 of 4 positions shown · non-contrast
Comparison: None.

CLINICAL DATA: 36-year-old female status post fall with laceration,
numbness.

EXAM:
LEFT KNEE - COMPLETE 4+ VIEW

[t knee ap left]
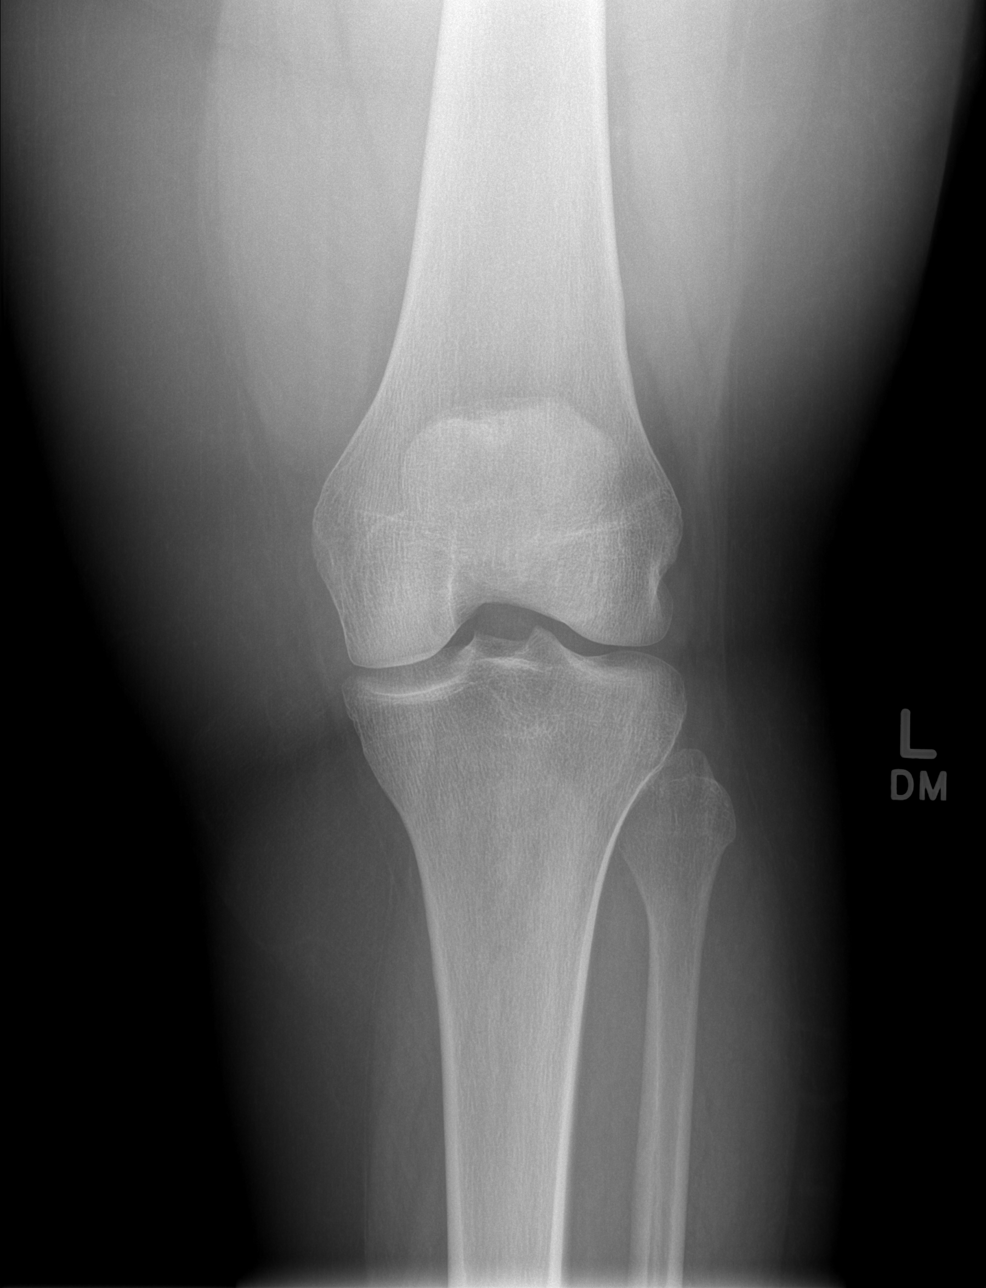

[t knee oblique left (1 of 2)]
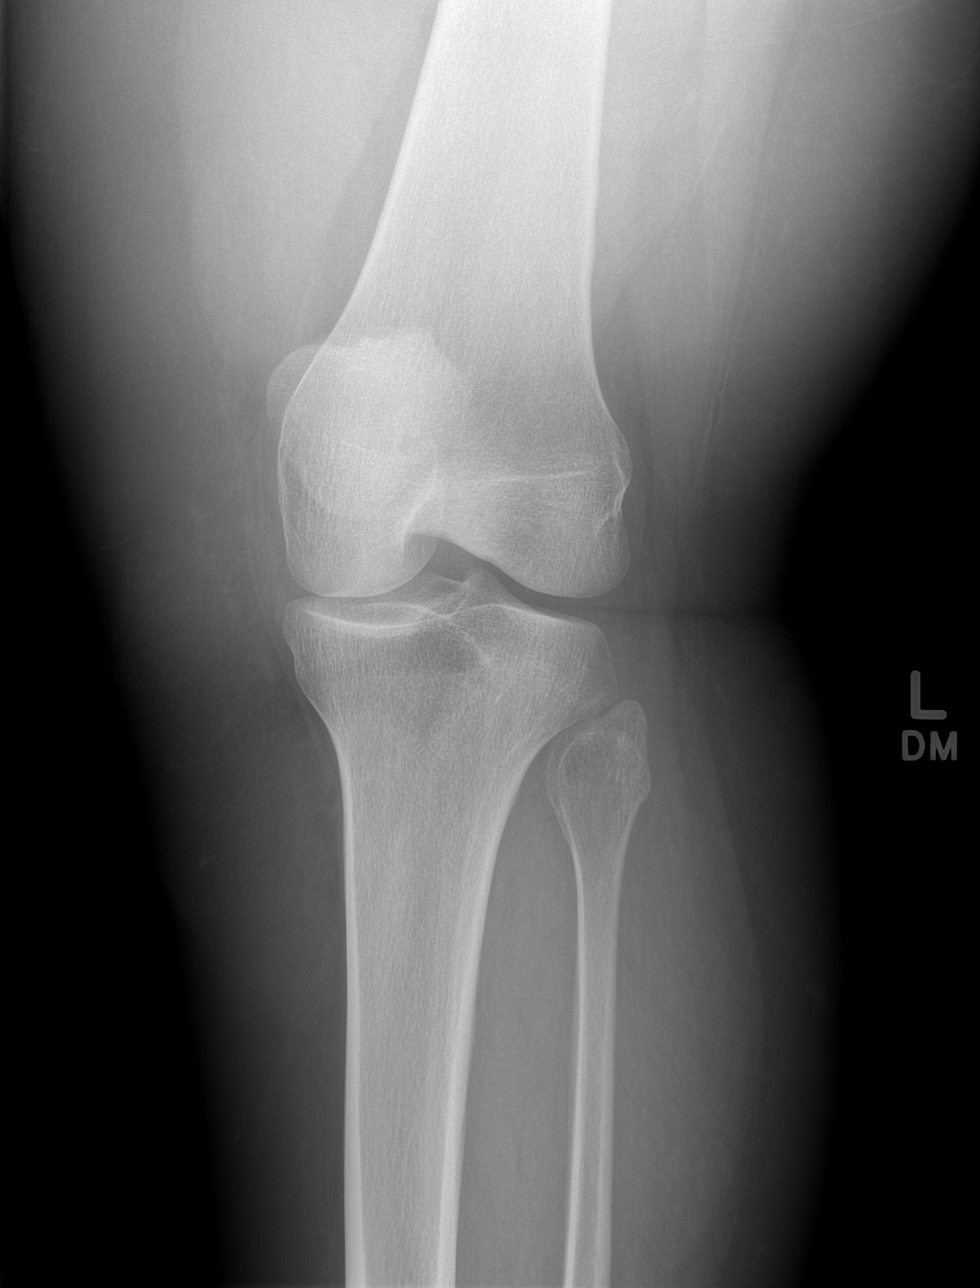

[t knee oblique left (2 of 2)]
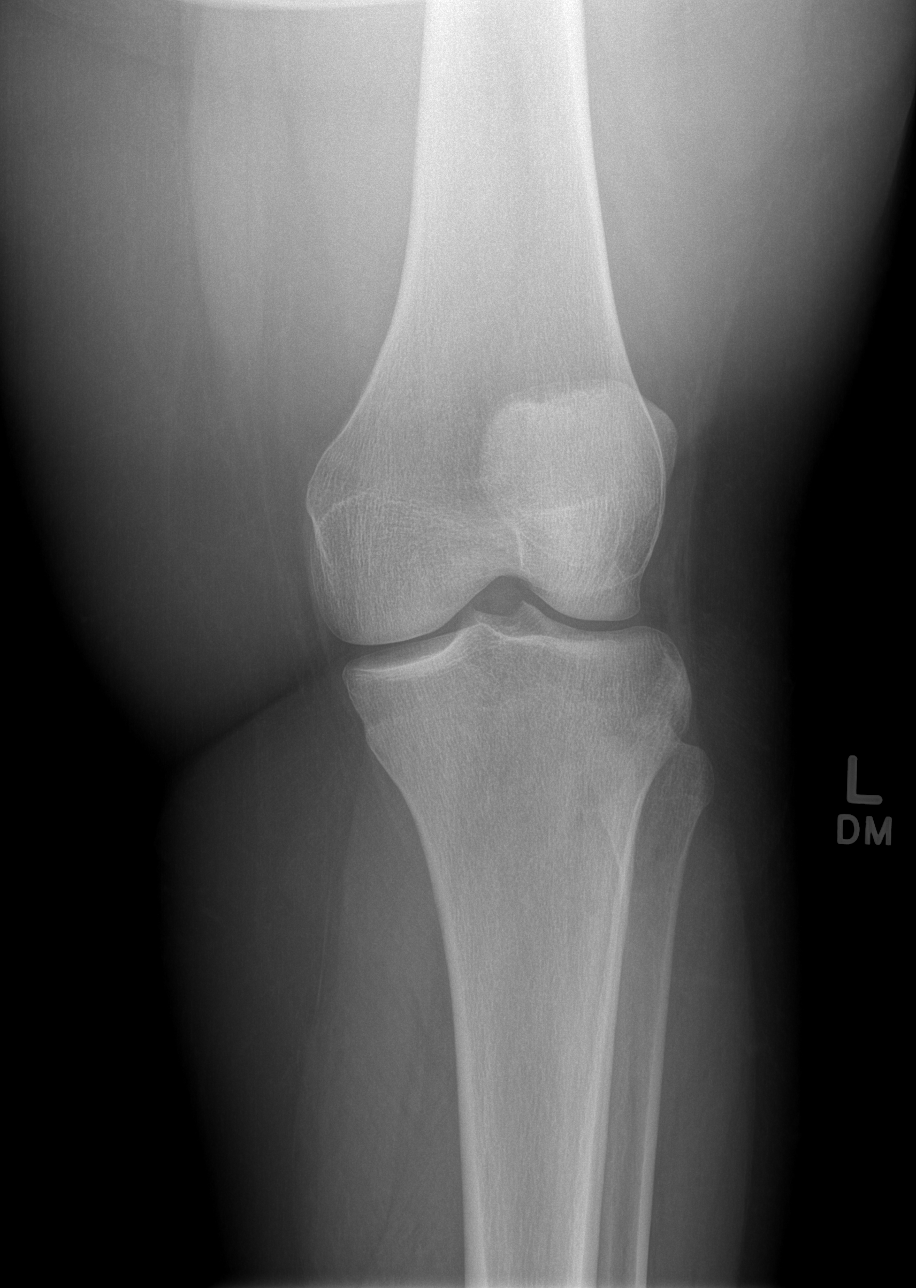

[t knee lat left]
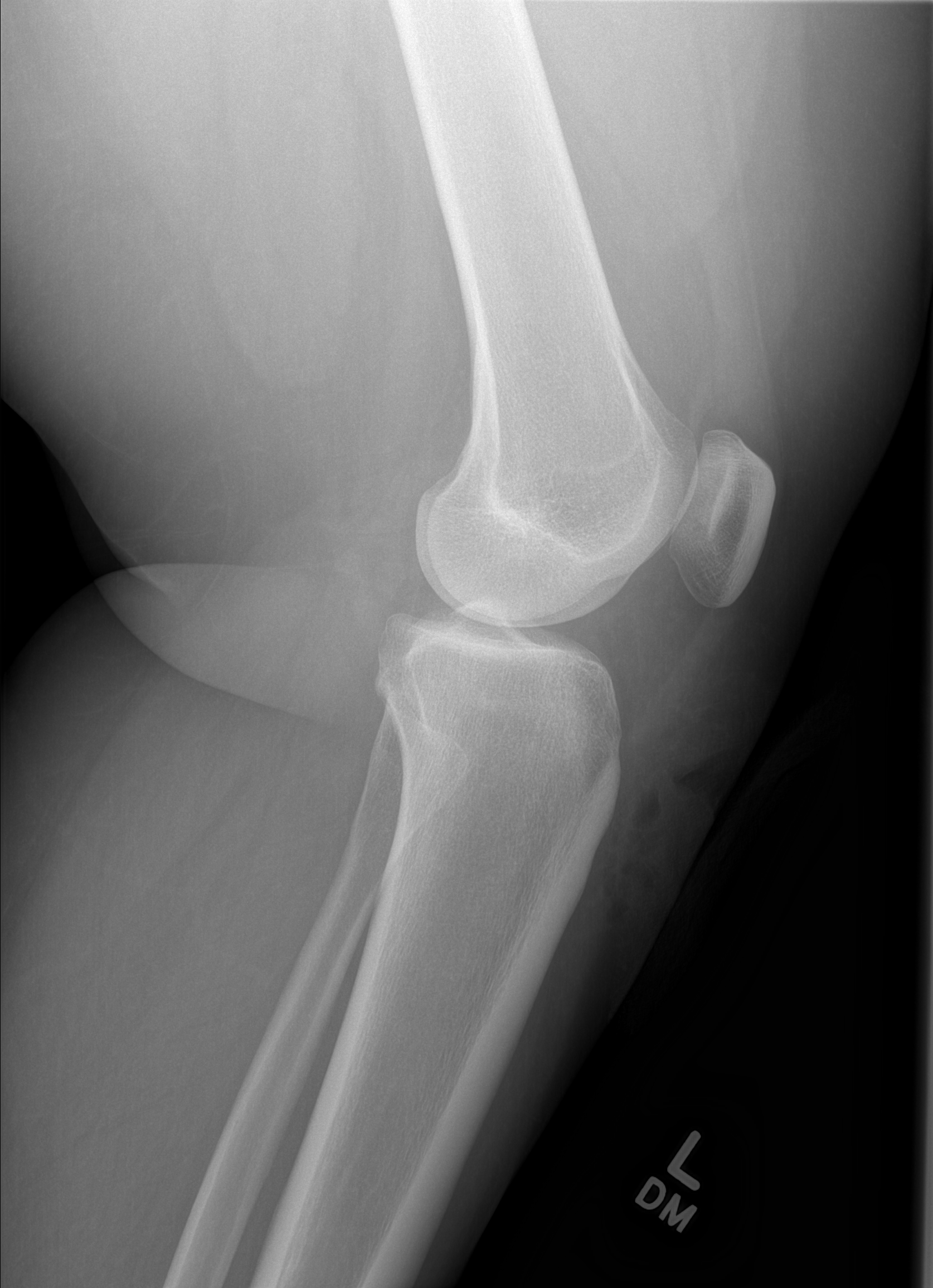

[4 of 4 positions shown; findings below may reference images not displayed]

FINDINGS: Large body habitus. Bone mineralization is within normal limits. No
joint effusion. Joint spaces are preserved. Patella intact.

Ventral soft tissue wound at the level of the proximal tibia. No
radiopaque foreign body identified. No associated osseous injury
identified.
IMPRESSION: Anterior soft tissue wound at the level of the proximal tibia. No
underlying acute fracture or dislocation identified at the left
knee.

## 2014-12-15 ENCOUNTER — Encounter: Payer: BC Managed Care – PPO | Admitting: Family

## 2014-12-31 HISTORY — PX: LAPAROSCOPIC GASTRIC SLEEVE RESECTION: SHX5895

## 2015-01-10 ENCOUNTER — Encounter: Payer: Self-pay | Admitting: Family

## 2015-01-10 ENCOUNTER — Ambulatory Visit (INDEPENDENT_AMBULATORY_CARE_PROVIDER_SITE_OTHER): Payer: BC Managed Care – PPO | Admitting: Family

## 2015-01-10 VITALS — BP 118/84 | HR 82 | Temp 98.2°F | Resp 16 | Ht 66.5 in | Wt 289.4 lb

## 2015-01-10 DIAGNOSIS — F419 Anxiety disorder, unspecified: Principal | ICD-10-CM

## 2015-01-10 DIAGNOSIS — F418 Other specified anxiety disorders: Secondary | ICD-10-CM

## 2015-01-10 DIAGNOSIS — F329 Major depressive disorder, single episode, unspecified: Secondary | ICD-10-CM

## 2015-01-10 DIAGNOSIS — I10 Essential (primary) hypertension: Secondary | ICD-10-CM | POA: Diagnosis not present

## 2015-01-10 DIAGNOSIS — F32A Depression, unspecified: Secondary | ICD-10-CM

## 2015-01-10 MED ORDER — FLUOXETINE HCL 20 MG/5ML PO SOLN: 60.0000 mg | Freq: Every day | ORAL | Status: AC

## 2015-01-10 NOTE — Progress Notes (Signed)
Subjective:    Patient ID: Whitney Harmon, female    DOB: February 26, 1976, 39 y.o.   MRN: [id number]  HPI   HTN-  She was taken off of the diuretic by her surgeon.  She continues carvedilol and clonidine.   BP Readings from Last 3 Encounters:  01/10/15 118/84  08/28/14 140/82  01/14/14 130/74    Wt Readings from Last 3 Encounters:  01/10/15 289 lb 6.4 oz (131.271 kg)  08/28/14 314 lb 3.2 oz (142.52 kg)  01/14/14 309 lb (140.161 kg)   Anxiety/Depression-  She stopped her prozac because she could not swallow the pill following her gastric sleeve. Finds the taste unpalatable if she removes from capsule.    Review of Systems See HPI  Past Medical History  Diagnosis Date  . Hypertension   . Asthma     childhood  . Overweight(278.02)   . Elevated antinuclear antibody (ANA) level 05/12/2011  . Bipolar disorder     Social History   Social History  . Marital Status: Single    Spouse Name: N/A  . Number of Children: 1  . 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 Smoking status: Never Smoker   . Smokeless tobacco: Never Used  . Alcohol Use: Yes     Comment: social, once a week  . Drug Use: No  . Sexual Activity: Not on file   Other Topics Concern  . 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 Salpingoophorectomy  2007    left  . 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 Arthritis Mother     osteoarthritis  . Diabetes Mother   . Hypertension Mother   . Mental illness Mother     schizophrenia  . Obesity Mother   . Schizophrenia Mother   . Hypertension Father   . Alcohol abuse    . Diabetes    . Hypertension      Allergies  Allergen Reactions  . Fish Oil Anaphylaxis  . Peanut Butter Flavor Anaphylaxis  . Peanuts [Peanut Oil] Itching    Has Epi pen,  though states only has itching/hives  . Apple Hives and Itching    Apples  . Vilazodone Hcl Diarrhea    Current Outpatient Prescriptions on File Prior to Visit  Medication Sig Dispense Refill  . albuterol (PROAIR HFA) 108 (90 BASE) MCG/ACT inhaler Inhale 2 puffs into the lungs every 6 (six) hours as needed. For wheezing 1 Inhaler 2  . ALPRAZolam (XANAX) 0.25 MG tablet TAKE ONE TABLET BY MOUTH THREE TIMES DAILY AS NEEDED FOR ANXIETY OR SLEEP 40 tablet 0  . carvedilol (COREG) 6.25 MG tablet Take 1 tablet (6.25 mg total) by mouth 2 (two) times daily with a meal. 180 tablet 1  . cloNIDine (CATAPRES) 0.1 MG tablet Take 1 tablet (0.1 mg total) by mouth 2 (two) times daily. 180 tablet 1  . doxycycline (VIBRAMYCIN) 100 MG capsule Take 100 mg by mouth daily.    Marland Kitchen EPINEPHrine (EPIPEN IJ) Inject as directed as needed.    . loratadine (CLARITIN) 10 MG tablet Take 10 mg by mouth daily as needed.     . norethindrone-ethinyl estradiol (JUNEL FE,GILDESS FE,LOESTRIN FE) 1-20 MG-MCG tablet Take 1 tablet by  mouth daily.     No current facility-administered medications on file prior to visit.    BP 118/84 mmHg  Pulse 82  Temp(Src) 98.2 F (36.8 C) (Oral)  Resp 16  Ht 5' 6.5" (1.689 m)  Wt 289 lb 6.4 oz (131.271 kg)  BMI 46.02 kg/m2  SpO2 100%  LMP 12/13/2014       Objective:   Physical Exam  Constitutional: She appears well-developed and well-nourished.  HENT:  Head: Normocephalic.  Cardiovascular: Normal rate, regular rhythm and normal heart sounds.   No murmur heard. Pulmonary/Chest: Effort normal and breath sounds normal. No respiratory distress. She has no wheezes.  Psychiatric: She has a normal mood and affect. Her behavior is normal. Judgment and thought content normal.          Assessment & Plan:

## 2015-01-10 NOTE — Progress Notes (Signed)
Pre visit review using our clinic review tool, if applicable. No additional management support is needed unless otherwise documented below in the visit note. 

## 2015-01-10 NOTE — Assessment & Plan Note (Signed)
Deteriorated. Rx sent for liquid prozac to try.

## 2015-01-10 NOTE — Assessment & Plan Note (Signed)
BP is stable. Continue current meds.  °

## 2015-01-10 NOTE — Patient Instructions (Signed)
Switch to prozac liquid. Follow up as scheduled.

## 2015-02-16 ENCOUNTER — Telehealth: Payer: Self-pay | Admitting: *Deleted

## 2015-02-16 NOTE — Telephone Encounter (Signed)
Unable to reach patient at time of Pre-Visit Call.  Left message for patient to return call when available.    

## 2015-02-19 ENCOUNTER — Ambulatory Visit (INDEPENDENT_AMBULATORY_CARE_PROVIDER_SITE_OTHER): Payer: BC Managed Care – PPO | Admitting: Family

## 2015-02-19 ENCOUNTER — Encounter: Payer: Self-pay | Admitting: Family

## 2015-02-19 VITALS — BP 128/90 | HR 69 | Temp 98.3°F | Resp 16 | Ht 66.5 in | Wt 275.6 lb

## 2015-02-19 DIAGNOSIS — R768 Other specified abnormal immunological findings in serum: Secondary | ICD-10-CM

## 2015-02-19 DIAGNOSIS — Z0000 Encounter for general adult medical examination without abnormal findings: Secondary | ICD-10-CM | POA: Diagnosis not present

## 2015-02-19 LAB — URINALYSIS, ROUTINE W REFLEX MICROSCOPIC
Hgb urine dipstick: NEGATIVE
Leukocytes, UA: NEGATIVE
NITRITE: NEGATIVE
RBC / HPF: NONE SEEN (ref 0–?)
Specific Gravity, Urine: 1.025 (ref 1.000–1.030)
TOTAL PROTEIN, URINE-UPE24: NEGATIVE
UROBILINOGEN UA: 2 — AB (ref 0.0–1.0)
Urine Glucose: NEGATIVE
pH: 6 (ref 5.0–8.0)

## 2015-02-19 LAB — CBC WITH DIFFERENTIAL/PLATELET
Basophils Absolute: 0 10*3/uL (ref 0.0–0.1)
Basophils Relative: 0.5 % (ref 0.0–3.0)
Eosinophils Absolute: 0.1 10*3/uL (ref 0.0–0.7)
Eosinophils Relative: 1.2 % (ref 0.0–5.0)
HCT: 36.8 % (ref 36.0–46.0)
Hemoglobin: 11.8 g/dL — ABNORMAL LOW (ref 12.0–15.0)
LYMPHS ABS: 2.1 10*3/uL (ref 0.7–4.0)
Lymphocytes Relative: 23.2 % (ref 12.0–46.0)
MCHC: 31.9 g/dL (ref 30.0–36.0)
MCV: 74.4 fl — ABNORMAL LOW (ref 78.0–100.0)
MONO ABS: 0.5 10*3/uL (ref 0.1–1.0)
Monocytes Relative: 4.9 % (ref 3.0–12.0)
NEUTROS PCT: 70.2 % (ref 43.0–77.0)
Neutro Abs: 6.4 10*3/uL (ref 1.4–7.7)
Platelets: 291 10*3/uL (ref 150.0–400.0)
RBC: 4.95 Mil/uL (ref 3.87–5.11)
RDW: 16 % — AB (ref 11.5–15.5)
WBC: 9.1 10*3/uL (ref 4.0–10.5)

## 2015-02-19 LAB — BASIC METABOLIC PANEL
BUN: 7 mg/dL (ref 6–23)
CO2: 27 mEq/L (ref 19–32)
CREATININE: 0.78 mg/dL (ref 0.40–1.20)
Calcium: 9.2 mg/dL (ref 8.4–10.5)
Chloride: 104 mEq/L (ref 96–112)
GFR: 106.98 mL/min (ref >60.00)
Glucose, Bld: 90 mg/dL (ref 70–99)
Potassium: 3.6 mEq/L (ref 3.5–5.1)
Sodium: 140 mEq/L (ref 135–145)

## 2015-02-19 LAB — HEPATIC FUNCTION PANEL
ALT: 14 U/L (ref 0–35)
AST: 12 U/L (ref 0–37)
Albumin: 3.5 g/dL (ref 3.5–5.2)
Alkaline Phosphatase: 69 U/L (ref 39–117)
BILIRUBIN TOTAL: 0.6 mg/dL (ref 0.2–1.2)
Bilirubin, Direct: 0.2 mg/dL (ref 0.0–0.3)
Total Protein: 6.9 g/dL (ref 6.0–8.3)

## 2015-02-19 LAB — LIPID PANEL
CHOL/HDL RATIO: 3
Cholesterol: 149 mg/dL (ref 0–200)
HDL: 45.5 mg/dL (ref >39.00)
LDL Cholesterol: 87 mg/dL (ref 0–99)
NONHDL: 103.9
Triglycerides: 83 mg/dL (ref 0.0–149.0)
VLDL: 16.6 mg/dL (ref 0.0–40.0)

## 2015-02-19 LAB — TSH: TSH: 0.53 u[IU]/mL (ref 0.35–4.50)

## 2015-02-19 MED ORDER — CARVEDILOL 6.25 MG PO TABS: 6.2500 mg | ORAL_TABLET | Freq: Two times a day (BID) | ORAL | Status: DC

## 2015-02-19 MED ORDER — CLONIDINE HCL 0.1 MG PO TABS: 0.1000 mg | ORAL_TABLET | Freq: Two times a day (BID) | ORAL | Status: DC

## 2015-02-19 NOTE — Patient Instructions (Signed)
Please complete lab work prior to leaving.  Follow up in 6 months.  

## 2015-02-19 NOTE — Progress Notes (Signed)
Subjective:    Patient ID: Whitney Harmon, female    DOB: [birth year], 39 y.o.   MRN: [id number]  HPI   Patient presents today for complete physical.  Immunizations: tetanus 2014, declines flu shot Diet: working on diet Exercise:  Walking, eliptical. Pap Smear: 6/16  Wt Readings from Last 3 Encounters:  02/19/15 275 lb 9.6 oz (125.011 kg)  01/10/15 289 lb 6.4 oz (131.271 kg)  08/28/14 314 lb 3.2 oz (142.52 kg)    Reports that she was told + SLE- continues to have joint pain, fatigue. Was told SLE + by Rheum in [place], would like referral to see Dr. Corliss Skains.  Review of Systems  Constitutional: Negative for unexpected weight change.  HENT: Negative for rhinorrhea.   Respiratory: Negative for cough.   Cardiovascular: Negative for leg swelling.  Gastrointestinal: Negative for diarrhea and constipation.  Genitourinary: Negative for dysuria, frequency and menstrual problem.  Musculoskeletal: Positive for myalgias and arthralgias.  Skin: Negative for rash.  Neurological: Negative for headaches.  Hematological: Negative for adenopathy.  Psychiatric/Behavioral:       + stress, she is seeing Dr. Evelene Croon -psychiatry   Past Medical History  Diagnosis Date  . Hypertension   . Asthma     childhood  . Overweight(278.02)   . Elevated antinuclear antibody (ANA) level 05/12/2011  . Bipolar disorder     Social History   Social History  . Marital Status: Single    Spouse Name: N/A  . Number of Children: 1  . 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 Smoking status: Never Smoker   . Smokeless tobacco: Never Used  . Alcohol Use: Yes     Comment: social, once a week  . Drug Use: No  . Sexual Activity: Not on file   Other Topics Concern  . 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 Salpingoophorectomy  2007    left  .  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 Arthritis Mother     osteoarthritis  . Diabetes Mother   . Hypertension Mother   . Mental illness Mother     schizophrenia  . Obesity Mother   . Schizophrenia Mother   . Hypertension Father   . Alcohol abuse    . Diabetes    . Hypertension      Allergies  Allergen Reactions  . Fish Oil Anaphylaxis  . Peanut Butter Flavor Anaphylaxis  . Peanuts [Peanut Oil] Itching    Has Epi pen, though states only has itching/hives  . Apple Hives and Itching    Apples  . Vilazodone Hcl Diarrhea    Current Outpatient Prescriptions on File Prior to Visit  Medication Sig Dispense Refill  . albuterol (PROAIR HFA) 108 (90 BASE) MCG/ACT inhaler Inhale 2 puffs into the lungs every 6 (six) hours as needed. For wheezing 1 Inhaler 2  . ALPRAZolam (XANAX) 0.25 MG tablet TAKE ONE TABLET BY MOUTH THREE TIMES DAILY AS NEEDED FOR ANXIETY OR SLEEP 40 tablet 0  . carvedilol (COREG) 6.25 MG tablet Take 1 tablet (6.25 mg total) by mouth 2 (two) times daily with a meal. 180 tablet 1  . cloNIDine (CATAPRES) 0.1 MG tablet Take 1 tablet (0.1 mg total) by  mouth 2 (two) times daily. 180 tablet 1  . doxycycline (VIBRAMYCIN) 100 MG capsule Take 100 mg by mouth daily.    Marland Kitchen EPINEPHrine (EPIPEN IJ) Inject as directed as needed.    Marland Kitchen FLUoxetine (PROZAC) 20 MG/5ML solution Take 15 mLs (60 mg total) by mouth daily. 450 mL 0  . loratadine (CLARITIN) 10 MG tablet Take 10 mg by mouth daily as needed.     . norethindrone-ethinyl estradiol (JUNEL FE,GILDESS FE,LOESTRIN FE) 1-20 MG-MCG tablet Take 1 tablet by mouth daily.    . ranitidine (ZANTAC) 150 MG tablet Take 150 mg by mouth 2 (two) times daily.    . sennosides-docusate sodium (SENOKOT-S) 8.6-50 MG tablet Take 1 tablet by mouth 2 (two) times daily.     No current facility-administered medications on file prior to visit.    BP 128/90 mmHg  Pulse 69  Temp(Src) 98.3 F  (36.8 C) (Oral)  Resp 16  Ht 5' 6.5" (1.689 m)  Wt 275 lb 9.6 oz (125.011 kg)  BMI 43.82 kg/m2  SpO2 98%  LMP 01/13/2015       Objective:   Physical Exam  Physical Exam  Constitutional: She is oriented to person, place, and time. She appears well-developed and well-nourished. No distress.  HENT:  Head: Normocephalic and atraumatic.  Right Ear: Tympanic membrane and ear canal normal.  Left Ear: Tympanic membrane and ear canal normal.  Mouth/Throat: Oropharynx is clear and moist.  Eyes: Pupils are equal, round, and reactive to light. No scleral icterus.  Neck: Normal range of motion. No thyromegaly present.  Cardiovascular: Normal rate and regular rhythm.   No murmur heard. Pulmonary/Chest: Effort normal and breath sounds normal. No respiratory distress. He has no wheezes. She has no rales. She exhibits no tenderness.  Abdominal: Soft. Bowel sounds are normal. He exhibits no distension and no mass. There is no tenderness. There is no rebound and no guarding.  Musculoskeletal: She exhibits no edema.  Lymphadenopathy:    She has no cervical adenopathy.  Neurological: She is alert and oriented to person, place, and time. She has normal patellar reflexes. She exhibits normal muscle tone. Coordination normal.  Skin: Skin is warm and dry.  Psychiatric: She has a normal mood and affect. Her behavior is normal. Judgment and thought content normal.  Breast/pelvic:  Deferred to GYN       Assessment & Plan:         Assessment & Plan:

## 2015-02-19 NOTE — Progress Notes (Signed)
Pre visit review using our clinic review tool, if applicable. No additional management support is needed unless otherwise documented below in the visit note. 

## 2015-02-19 NOTE — Assessment & Plan Note (Signed)
Ongoing joint pain/fatigue- will refer to Dr. Corliss Skains

## 2015-02-19 NOTE — Assessment & Plan Note (Signed)
Immunizations reviewed and up to date.  Continue healthy diet, exercise weight loss efforts.  Obtain routine labs.  Declines flu shot.

## 2015-04-02 ENCOUNTER — Telehealth: Payer: Self-pay | Admitting: Family

## 2015-04-02 NOTE — Telephone Encounter (Signed)
Caller name: Rodney Boozeasha  Relationship to patient: Self   Can be reached: [phone]  Pharmacy:  Reason for call: pt called in to get her TB test. She says that she need it for her job.  Please advise for scheduling.

## 2015-04-02 NOTE — Telephone Encounter (Signed)
Scheduled nurse visit for tomorrow at 10am for PPD placement. States she doesn't have form from employer but will need documentation of the results once test has been read. She will return Thursday at 4pm for the reading and is aware that Friday at 10am is the latest to read result.

## 2015-04-03 ENCOUNTER — Ambulatory Visit (INDEPENDENT_AMBULATORY_CARE_PROVIDER_SITE_OTHER): Payer: BC Managed Care – PPO

## 2015-04-03 DIAGNOSIS — Z111 Encounter for screening for respiratory tuberculosis: Secondary | ICD-10-CM

## 2015-04-05 DIAGNOSIS — Z111 Encounter for screening for respiratory tuberculosis: Secondary | ICD-10-CM | POA: Diagnosis not present

## 2015-04-05 LAB — TB SKIN TEST
Induration: 0 mm
TB Skin Test: NEGATIVE

## 2015-07-05 ENCOUNTER — Encounter: Payer: Self-pay | Admitting: Family Medicine

## 2015-07-05 ENCOUNTER — Ambulatory Visit (INDEPENDENT_AMBULATORY_CARE_PROVIDER_SITE_OTHER): Payer: BC Managed Care – PPO | Admitting: Family Medicine

## 2015-07-05 VITALS — BP 120/78 | HR 82 | Temp 98.6°F | Ht 66.5 in | Wt 238.1 lb

## 2015-07-05 DIAGNOSIS — J0100 Acute maxillary sinusitis, unspecified: Secondary | ICD-10-CM | POA: Diagnosis not present

## 2015-07-05 DIAGNOSIS — I10 Essential (primary) hypertension: Secondary | ICD-10-CM | POA: Diagnosis not present

## 2015-07-05 DIAGNOSIS — R51 Headache: Secondary | ICD-10-CM | POA: Diagnosis not present

## 2015-07-05 DIAGNOSIS — R519 Headache, unspecified: Secondary | ICD-10-CM

## 2015-07-05 MED ORDER — DOXYCYCLINE HYCLATE 100 MG PO TABS: 100.0000 mg | ORAL_TABLET | Freq: Two times a day (BID) | ORAL | Status: DC

## 2015-07-05 NOTE — Patient Instructions (Addendum)
Stop your minocycline for 10 days and instead take doxycycline twice daily  Please take the coreg 1/2 tablet twice daily and continue your clonidine for now  Call today to schedule a follow up with [name] in about 2 weeks to recheck your blood pressure, follow up on your headaches and discuss any further changes to your blood pressure medications if needed.

## 2015-07-05 NOTE — Progress Notes (Signed)
HPI:  Whitney Harmon is a pleasant 40 yo F here for an acute visit for several issues:  1-2) sinus pain and headaches: -for 2-3 weeks -started with a cold -with persistent nasalcongestion, sinus pressure, maxillary sinus pain and upper tooth pain R>L, frontal headache intermittently -better with excedrin -reports hx of headaches and excedrin usually helps - associated with light sensitivity, worse since sinus issues -denies: NV, fevers, changes in vision, aura, SOB  3)Hypertension -reports longstanding and on triple therapy in the past -now on coreg and clonidine -reports since bariatric surgery 6 months ago BP has dropped sig with 80lb wt loss and her bariatric surgeon told her she may need to wean off some of her BP meds -reports she sometimes feels a little dizzy when stands up and thinks her coreg make her tired - reports experimented with taking once daily and felt better -denies: CP, SOB, hx tachycardia or palpitations    ROS: See pertinent positives and negatives per HPI.  Past Medical History  Diagnosis Date  . Hypertension   . Asthma     childhood  . Overweight(278.02)   . Elevated antinuclear antibody (ANA) level 05/12/2011  . Bipolar disorder [facility])     Past Surgical History  Procedure Laterality Date  . Salpingoophorectomy  2007    left  . 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 Arthritis Mother     osteoarthritis  . Diabetes Mother   . Hypertension Mother   . Mental illness Mother     schizophrenia  . Obesity Mother   . Schizophrenia Mother   . Hypertension Father   . Alcohol abuse    . Diabetes    . Hypertension      Social History   Social History  . Marital Status: Single    Spouse Name: N/A  . Number of Children: 1  . 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 Smoking status: Never Smoker   . Smokeless tobacco:  Never Used  . Alcohol Use: Yes     Comment: social, once a week  . Drug Use: No  . Sexual Activity: Not Asked   Other Topics Concern  . 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  albuterol (PROAIR HFA) 108 (90 BASE) MCG/ACT inhaler, Inhale 2 puffs into the lungs every 6 (six) hours as needed. For wheezing, Disp: 1 Inhaler, Rfl: 2 .  ALPRAZolam (XANAX) 0.25 MG tablet, TAKE ONE TABLET BY MOUTH THREE TIMES DAILY AS NEEDED FOR ANXIETY OR SLEEP, Disp: 40 tablet, Rfl: 0 .  carvedilol (COREG) 6.25 MG tablet, Take 1 tablet (6.25 mg total) by mouth 2 (two) times daily with a meal., Disp: 180 tablet, Rfl: 1 .  cloNIDine (CATAPRES) 0.1 MG tablet, Take 1 tablet (0.1 mg total) by mouth 2 (two) times daily., Disp: 180 tablet, Rfl: 1 .  Cyanocobalamin (VITAMIN B-12 PO), Take by mouth., Disp: , Rfl:  .  EPINEPHrine (EPIPEN IJ), Inject as directed as needed., Disp: , Rfl:  .  Ferrous Sulfate Dried (FERROUS SULFATE CR PO), Take by mouth., Disp: , Rfl:  .  FLUoxetine (PROZAC) 20 MG/5ML solution, Take 15 mLs (60 mg total) by mouth daily., Disp: 450 mL, Rfl: 0 .  loratadine (CLARITIN) 10 MG tablet,  Take 10 mg by mouth daily as needed. , Disp: , Rfl:  .  minocycline (MINOCIN,DYNACIN) 50 MG capsule, , Disp: , Rfl:  .  Multiple Vitamin (MULTIVITAMIN) capsule, Take 1 capsule by mouth daily., Disp: , Rfl:  .  norethindrone-ethinyl estradiol (JUNEL FE,GILDESS FE,LOESTRIN FE) 1-20 MG-MCG tablet, Take 1 tablet by mouth daily., Disp: , Rfl:  .  ranitidine (ZANTAC) 150 MG tablet, Take 150 mg by mouth 2 (two) times daily., Disp: , Rfl:  .  sennosides-docusate sodium (SENOKOT-S) 8.6-50 MG tablet, Take 1 tablet by mouth 2 (two) times daily., Disp: , Rfl:  .  ursodiol (ACTIGALL) 300 MG capsule, Take 300 mg by mouth., Disp: , Rfl:  .  doxycycline (VIBRA-TABS) 100 MG tablet, Take 1 tablet (100 mg total) by mouth 2 (two) times daily., Disp: 20 tablet, Rfl:  0  EXAM:  Filed Vitals:   07/05/15 1052  BP: 120/78  Pulse: 82  Temp: 98.6 F (37 C)    Body mass index is 37.86 kg/(m^2).  GENERAL: vitals reviewed and listed above, alert, oriented, appears well hydrated and in no acute distress  HEENT: atraumatic, conjunttiva clear, no obvious abnormalities on inspection of external nose and ears, normal appearance of ear canals and TMs, thick  nasal congestion, mild post oropharyngeal erythema with PND, no tonsillar edema or exudate, R max sinus TTP  NECK: no obvious masses on inspection  LUNGS: clear to auscultation bilaterally, no wheezes, rales or rhonchi, good air movement  CV: HRRR, no peripheral edema; orthostatic testing neg borderline on systolic BP  MS: moves all extremities without noticeable abnormality  PSYCH: pleasant and cooperative, no obvious depression or anxiety  ASSESSMENT AND PLAN:  Discussed the following assessment and plan:  Acute maxillary sinusitis, recurrence not specified -abx - stop skin abx in interim and restart once finished with doxy as doxy will be more then sufficient in interim  Frequent headaches -likely related to the sinus infection, monitor - consider further eval if persistent after tx above, advised to keep journal and follow up with PCP in 2-4 weeks  Essential hypertension -odd antihypertensive regimen -she is concerned about the BB so will start wean off this and defer to PCP for further adjustment as my be reason I am not aware of that she is on this regimen -if persistent dizziness and need for antihypertensive would perhaps consider wean off clonidine and bb in future if no good indication for these particular medications and trial first line med such as norvasc  -follow up with PCP in 2-4 weeks -Patient advised to return or notify a doctor immediately if symptoms worsen or persist or new concerns arise.  Patient Instructions  Stop your minocycline for 10 days and instead take doxycycline  twice daily  Please take the coreg 1/2 tablet twice daily and continue your clonidine for now  Call today to schedule a follow up with [name] in about 2 weeks to recheck your blood pressure, follow up on your headaches and discuss any furthe rchanges to your blood rpessure medications if needed.         Kriste Basque R.

## 2015-07-05 NOTE — Progress Notes (Signed)
Pre visit review using our clinic review tool, if applicable. No additional management support is needed unless otherwise documented below in the visit note. 

## 2015-07-23 ENCOUNTER — Encounter: Payer: Self-pay | Admitting: Family

## 2015-07-23 ENCOUNTER — Telehealth: Payer: Self-pay | Admitting: *Deleted

## 2015-07-23 ENCOUNTER — Ambulatory Visit (INDEPENDENT_AMBULATORY_CARE_PROVIDER_SITE_OTHER): Payer: BC Managed Care – PPO | Admitting: Family

## 2015-07-23 VITALS — BP 166/98 | HR 60 | Temp 98.1°F | Resp 16 | Ht 66.5 in | Wt 241.2 lb

## 2015-07-23 DIAGNOSIS — I10 Essential (primary) hypertension: Secondary | ICD-10-CM

## 2015-07-23 MED ORDER — AMLODIPINE BESYLATE 5 MG PO TABS
5.0000 mg | ORAL_TABLET | Freq: Every day | ORAL | Status: DC
Start: 2015-07-23 — End: 2015-09-21

## 2015-07-23 NOTE — Progress Notes (Signed)
Subjective:    Patient ID: Whitney Harmon, female    DOB: [birth year], 40 y.o.   MRN: [id number]  HPI  [name] is a 40 yr old female with a long standing history of difficult to control hypertension.  She has previously been controlled on a regimen on hctz, amlodipine and clonidine before [place] of [place].  She returned to [place] back in April 2016 and was on carvedilol and clonidine and hctz/amlodipine had been d/c'd by her out of state provider.  BP was up some at that time and we restarted hctz.  BP improved. She then underwent bariatric surgery and due to weight loss her surgeon d/c'd hctz.    She was seen on 07/05/15 by Dr. Kriste Basque for an acute Maxillary sinusitis.  She reported some dizziness with standing at that time and reported that the coreg seemed to make her tired so she dropped down to a once daily dosing of coreg. She reports that she is now taking 1/2 tab of coreg twice daily and the tiredness seems to have improved.  BP is up today however. Reports resolution of sinus symptoms but does report + HA.   BP Readings from Last 3 Encounters:  07/23/15 166/98  07/05/15 120/78  02/19/15 128/90     Review of Systems Past Medical History  Diagnosis Date  . Hypertension   . Asthma     childhood  . Overweight(278.02)   . Elevated antinuclear antibody (ANA) level 05/12/2011  . Bipolar disorder [facility])     Social History   Social History  . Marital Status: Single    Spouse Name: N/A  . Number of Children: 1  . 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 Smoking status: Never Smoker   . Smokeless tobacco: Never Used  . Alcohol Use: Yes     Comment: social, once a week  . Drug Use: No  . Sexual Activity: Not on file   Other Topics Concern  . 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 Salpingoophorectomy   2007    left  . 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 Arthritis Mother     osteoarthritis  . Diabetes Mother   . Hypertension Mother   . Mental illness Mother     schizophrenia  . Obesity Mother   . Schizophrenia Mother   . Hypertension Father   . Alcohol abuse    . Diabetes    . Hypertension      Allergies  Allergen Reactions  . Fish Oil Anaphylaxis  . Peanut Butter Flavor Anaphylaxis  . Peanuts [Peanut Oil] Itching    Has Epi pen, though states only has itching/hives  . Apple Hives and Itching    Apples  . Vilazodone Hcl Diarrhea    Current Outpatient Prescriptions on File Prior to Visit  Medication Sig Dispense Refill  . albuterol (PROAIR HFA) 108 (90 BASE) MCG/ACT inhaler Inhale 2 puffs into the lungs every 6 (six) hours as needed. For wheezing 1 Inhaler 2  . ALPRAZolam (XANAX) 0.25 MG tablet TAKE ONE TABLET BY MOUTH THREE TIMES DAILY AS NEEDED FOR ANXIETY OR SLEEP 40 tablet 0  . carvedilol (COREG) 6.25 MG tablet Take 1  tablet (6.25 mg total) by mouth 2 (two) times daily with a meal. 180 tablet 1  . cloNIDine (CATAPRES) 0.1 MG tablet Take 1 tablet (0.1 mg total) by mouth 2 (two) times daily. 180 tablet 1  . Cyanocobalamin (VITAMIN B-12 PO) Take by mouth.    . doxycycline (VIBRA-TABS) 100 MG tablet Take 1 tablet (100 mg total) by mouth 2 (two) times daily. 20 tablet 0  . EPINEPHrine (EPIPEN IJ) Inject as directed as needed.    . Ferrous Sulfate Dried (FERROUS SULFATE CR PO) Take by mouth.    Marland Kitchen FLUoxetine (PROZAC) 20 MG/5ML solution Take 15 mLs (60 mg total) by mouth daily. 450 mL 0  . loratadine (CLARITIN) 10 MG tablet Take 10 mg by mouth daily as needed.     . minocycline (MINOCIN,DYNACIN) 50 MG capsule     . Multiple Vitamin (MULTIVITAMIN) capsule Take 1 capsule by mouth daily.    . norethindrone-ethinyl estradiol (JUNEL FE,GILDESS FE,LOESTRIN FE) 1-20 MG-MCG tablet Take 1 tablet by mouth daily.    .  ranitidine (ZANTAC) 150 MG tablet Take 150 mg by mouth 2 (two) times daily.    . sennosides-docusate sodium (SENOKOT-S) 8.6-50 MG tablet Take 1 tablet by mouth 2 (two) times daily.    . ursodiol (ACTIGALL) 300 MG capsule Take 300 mg by mouth.     No current facility-administered medications on file prior to visit.    BP 166/98 mmHg  Pulse 60  Temp(Src) 98.1 F (36.7 C) (Oral)  Resp 16  Ht 5' 6.5" (1.689 m)  Wt 241 lb 3.2 oz (109.408 kg)  BMI 38.35 kg/m2  SpO2 99%  LMP 06/22/2015       Objective:   Physical Exam  Constitutional: She appears well-developed and well-nourished.  Cardiovascular: Normal rate, regular rhythm and normal heart sounds.   No murmur heard. Pulmonary/Chest: Effort normal and breath sounds normal. No respiratory distress. She has no wheezes.  Psychiatric: She has a normal mood and affect. Her behavior is normal. Judgment and thought content normal.          Assessment & Plan:

## 2015-07-23 NOTE — Assessment & Plan Note (Signed)
Uncontrolled.  I don't think she is getting much BP benefit from low dose coreg and HR is borderline low.  Advised pt d/c coreg, start amlodipine, continue clonidine. Follow up in 1 week for nurse visit. Will consider adding hctz if BP remains high in 1 week.

## 2015-07-23 NOTE — Patient Instructions (Addendum)
Stop coreg (carvedilol). Start amlodipine . Continue clonidine.   Follow up in 1 week for blood pressure recheck with nurse.

## 2015-07-23 NOTE — Telephone Encounter (Signed)
Whitney Harmon-- pt is wanting to know status update on referral to rheumatologist that was placed on 02/21/15. Results were received from [place] and have been scanned into her record. They are dated 04/2014. Pt said you can leave a detailed message on her cell voicemail.  Thanks!

## 2015-07-23 NOTE — Progress Notes (Signed)
Pre visit review using our clinic review tool, if applicable. No additional management support is needed unless otherwise documented below in the visit note. 

## 2015-07-24 NOTE — Telephone Encounter (Signed)
Records received, faxed to Dr Deveshwar/awaiting appt

## 2015-08-20 ENCOUNTER — Telehealth: Payer: Self-pay | Admitting: Family

## 2015-08-20 ENCOUNTER — Ambulatory Visit: Payer: BC Managed Care – PPO | Admitting: Family

## 2015-08-21 ENCOUNTER — Encounter: Payer: Self-pay | Admitting: Family

## 2015-08-21 NOTE — Telephone Encounter (Signed)
Yes please

## 2015-08-21 NOTE — Telephone Encounter (Signed)
Marked to charge and mailing no show letter °

## 2015-08-21 NOTE — Telephone Encounter (Signed)
Pt was no show 08/20/15 5:00pm for follow up appt, pt has not rescheduled, 1st no show w/in 12 months, charge or no charge?

## 2015-09-21 ENCOUNTER — Other Ambulatory Visit: Payer: Self-pay | Admitting: Family

## 2015-09-21 NOTE — Telephone Encounter (Signed)
Sent 2 week supply of amlodipine to pharmacy. Pt last seen 07/23/15 and started on Amlodipine, d/c'd coreg. Pt did not f/u for nurse visit BP check and will now need f/u with PCP, O'sullivan ASAP. Thanks!

## 2015-09-25 NOTE — Telephone Encounter (Signed)
Danford BadKristie-- can you try to schedule pt appt with Melissa as she is past due and I only sent a 2 week supply for her on 4/28.  Thanks!

## 2015-09-26 NOTE — Telephone Encounter (Signed)
Left msg for pt to schedule appt.  °

## 2015-10-01 ENCOUNTER — Encounter: Payer: Self-pay | Admitting: Family

## 2015-10-01 ENCOUNTER — Ambulatory Visit (INDEPENDENT_AMBULATORY_CARE_PROVIDER_SITE_OTHER): Payer: BC Managed Care – PPO | Admitting: Family

## 2015-10-01 VITALS — BP 146/102 | HR 72 | Temp 98.5°F | Resp 16 | Ht 66.5 in | Wt 233.0 lb

## 2015-10-01 DIAGNOSIS — I10 Essential (primary) hypertension: Secondary | ICD-10-CM

## 2015-10-01 DIAGNOSIS — F418 Other specified anxiety disorders: Secondary | ICD-10-CM | POA: Diagnosis not present

## 2015-10-01 DIAGNOSIS — F329 Major depressive disorder, single episode, unspecified: Secondary | ICD-10-CM

## 2015-10-01 DIAGNOSIS — F419 Anxiety disorder, unspecified: Secondary | ICD-10-CM

## 2015-10-01 MED ORDER — AMOXICILLIN-POT CLAVULANATE 875-125 MG PO TABS: 1.0000 | ORAL_TABLET | Freq: Two times a day (BID) | ORAL | Status: DC

## 2015-10-01 MED ORDER — AMLODIPINE BESYLATE 10 MG PO TABS: 10.0000 mg | ORAL_TABLET | Freq: Every day | ORAL | Status: DC

## 2015-10-01 NOTE — Patient Instructions (Signed)
Increase amlodipine to 10mg  once daily. Start augmentin for sinusitis. Follow up in 1 month.

## 2015-10-01 NOTE — Progress Notes (Signed)
Subjective:    Patient ID: Whitney Harmon, female    DOB: [birth year], 40 y.o.   MRN: [id number]  HPI  Whitney Harmon is a 40 yr old female who presents today for follow up of her blood pressure. Current BP meds include amlodipine, clonidine.  Last visit we d/c'd coreg and began amlodipine, continue clonidine. She was advised to follow up in 1 week for nurse visit (this was 07/23/15). This is her first visit back to see [place].  BP Readings from Last 3 Encounters:  10/01/15 146/102  07/23/15 166/98  07/05/15 120/78   Anxiety/Depression- Followed by Dr. Evelene Croon.  She is concerned that the wellbutrin could be raising her blood pressure.    Reports + sinus drainage, pressure, HA for several weeks.  Review of Systems See HPI  Past Medical History  Diagnosis Date  . Hypertension   . Asthma     childhood  . Overweight(278.02)   . Elevated antinuclear antibody (ANA) level 05/12/2011  . Bipolar disorder [facility])      Social History   Social History  . Marital Status: Single    Spouse Name: N/A  . Number of Children: 1  . 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 Smoking status: Never Smoker   . Smokeless tobacco: Never Used  . Alcohol Use: Yes     Comment: social, once a week  . Drug Use: No  . Sexual Activity: Not on file   Other Topics Concern  . 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 Salpingoophorectomy  2007    left  . 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 Arthritis Mother     osteoarthritis  . Diabetes Mother   . Hypertension Mother   . Mental illness Mother     schizophrenia  . Obesity Mother   . Schizophrenia Mother   . Hypertension Father   . Alcohol abuse    . Diabetes    . Hypertension      Allergies  Allergen Reactions  . Fish Oil  Anaphylaxis  . Peanut Butter Flavor Anaphylaxis  . Peanuts [Peanut Oil] Itching    Has Epi pen, though states only has itching/hives  . Apple Hives and Itching    Apples  . Vilazodone Hcl Diarrhea    Current Outpatient Prescriptions on File Prior to Visit  Medication Sig Dispense Refill  . albuterol (PROAIR HFA) 108 (90 BASE) MCG/ACT inhaler Inhale 2 puffs into the lungs every 6 (six) hours as needed. For wheezing 1 Inhaler 2  . ALPRAZolam (XANAX) 0.25 MG tablet TAKE ONE TABLET BY MOUTH THREE TIMES DAILY AS NEEDED FOR ANXIETY OR SLEEP 40 tablet 0  . cloNIDine (CATAPRES) 0.1 MG tablet Take 1 tablet (0.1 mg total) by mouth 2 (two) times daily. 180 tablet 1  . Cyanocobalamin (VITAMIN B-12 PO) Take by mouth.    . EPINEPHrine (EPIPEN IJ) Inject as directed as needed.    . Ferrous Sulfate Dried (FERROUS SULFATE CR PO) Take by mouth.    Marland Kitchen FLUoxetine (PROZAC) 20 MG/5ML solution Take 15 mLs (60 mg total) by mouth daily. 450 mL 0  . loratadine (CLARITIN) 10 MG tablet Take 10 mg by mouth daily as needed.     Marland Kitchen  Multiple Vitamin (MULTIVITAMIN) capsule Take 1 capsule by mouth daily.    . ranitidine (ZANTAC) 150 MG tablet Take 150 mg by mouth 2 (two) times daily.    . sennosides-docusate sodium (SENOKOT-S) 8.6-50 MG tablet Take 1 tablet by mouth 2 (two) times daily.    . ursodiol (ACTIGALL) 300 MG capsule Take 300 mg by mouth.     No current facility-administered medications on file prior to visit.    BP 146/102 mmHg  Pulse 72  Temp(Src) 98.5 F (36.9 C) (Oral)  Resp 16  Ht 5' 6.5" (1.689 m)  Wt 233 lb (105.688 kg)  BMI 37.05 kg/m2  SpO2 98%      see HPI  Past Medical History  Diagnosis Date  . Hypertension   . Asthma     childhood  . Overweight(278.02)   . Elevated antinuclear antibody (ANA) level 05/12/2011  . Bipolar disorder [facility])      Social History   Social History  . Marital Status: Single    Spouse Name: N/A  . Number of Children: 1  . 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 Smoking status: Never Smoker   . Smokeless tobacco: Never Used  . Alcohol Use: Yes     Comment: social, once a week  . Drug Use: No  . Sexual Activity: Not on file   Other Topics Concern  . 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 Salpingoophorectomy  2007    left  . 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 Arthritis Mother     osteoarthritis  . Diabetes Mother   . Hypertension Mother   . Mental illness Mother     schizophrenia  . Obesity Mother   . Schizophrenia Mother   . Hypertension Father   . Alcohol abuse    . Diabetes    . Hypertension      Allergies  Allergen Reactions  . Fish Oil Anaphylaxis  . Peanut Butter Flavor Anaphylaxis  . Peanuts [Peanut Oil] Itching    Has Epi pen, though states only has itching/hives  . Apple Hives and Itching    Apples  . Vilazodone Hcl Diarrhea    Current Outpatient Prescriptions on File Prior to Visit  Medication Sig Dispense Refill  . albuterol (PROAIR HFA) 108 (90 BASE) MCG/ACT inhaler Inhale 2 puffs into the lungs every 6 (six) hours as needed. For wheezing 1 Inhaler 2  . ALPRAZolam (XANAX) 0.25 MG tablet TAKE ONE TABLET BY MOUTH THREE TIMES DAILY AS NEEDED FOR ANXIETY OR SLEEP 40 tablet 0  . amLODipine (NORVASC) 5 MG tablet TAKE ONE TABLET BY MOUTH ONCE DAILY 14 tablet 0  . cloNIDine (CATAPRES) 0.1 MG tablet Take 1 tablet (0.1 mg total) by mouth 2 (two) times daily. 180 tablet 1  . Cyanocobalamin (VITAMIN B-12 PO) Take by mouth.    . EPINEPHrine (EPIPEN IJ) Inject as directed as needed.    . Ferrous Sulfate Dried (FERROUS SULFATE CR PO) Take by mouth.    Marland Kitchen FLUoxetine (PROZAC) 20 MG/5ML solution Take 15 mLs (60 mg total) by mouth daily. 450 mL 0  . loratadine (CLARITIN) 10 MG tablet Take 10 mg  by mouth daily as needed.     Marland Kitchen  Multiple Vitamin (MULTIVITAMIN) capsule Take 1 capsule by mouth daily.    . ranitidine (ZANTAC) 150 MG tablet Take 150 mg by mouth 2 (two) times daily.    . sennosides-docusate sodium (SENOKOT-S) 8.6-50 MG tablet Take 1 tablet by mouth 2 (two) times daily.    . ursodiol (ACTIGALL) 300 MG capsule Take 300 mg by mouth.     No current facility-administered medications on file prior to visit.    BP 146/102 mmHg  Pulse 72  Temp(Src) 98.5 F (36.9 C) (Oral)  Resp 16  Ht 5' 6.5" (1.689 m)  Wt 233 lb (105.688 kg)  BMI 37.05 kg/m2  SpO2 98%    Objective:   Physical Exam  Constitutional: She appears well-developed and well-nourished.  HENT:  Nose: Right sinus exhibits maxillary sinus tenderness and frontal sinus tenderness. Left sinus exhibits maxillary sinus tenderness and frontal sinus tenderness.  Cardiovascular: Normal rate, regular rhythm and normal heart sounds.   No murmur heard. Pulmonary/Chest: Effort normal and breath sounds normal. No respiratory distress. She has no wheezes.  Psychiatric: She has a normal mood and affect. Her behavior is normal. Judgment and thought content normal.  skin: dry skin is noted.        Assessment & Plan:  Sinusitis- new. Advised pt to continue claritin, start augmentin.

## 2015-10-01 NOTE — Assessment & Plan Note (Signed)
Uncontrolled. Increase amlodipine from 5mg  to 10mg . Continue clonidine.

## 2015-10-01 NOTE — Progress Notes (Signed)
Pre visit review using our clinic review tool, if applicable. No additional management support is needed unless otherwise documented below in the visit note. 

## 2015-10-01 NOTE — Assessment & Plan Note (Signed)
Advised pt to follow back up with psychiatry to discuss med concerns.

## 2015-11-12 ENCOUNTER — Ambulatory Visit: Payer: BC Managed Care – PPO | Admitting: Family

## 2015-11-13 ENCOUNTER — Telehealth: Payer: Self-pay | Admitting: Family

## 2015-11-13 NOTE — Telephone Encounter (Signed)
Patient was No Show 6/19. Last No Show was 08/20/15. Charge or No Charge?

## 2015-11-13 NOTE — Telephone Encounter (Signed)
Forwarding to [place] Jamahiriyaricia and Melissa.

## 2015-11-13 NOTE — Telephone Encounter (Signed)
No charge. Tks

## 2015-11-13 NOTE — Telephone Encounter (Signed)
Pt was 10  Minutes late for her appt yesterday and left without r/s with the front office.  Please advise?

## 2015-11-14 ENCOUNTER — Encounter: Payer: Self-pay | Admitting: Family

## 2015-12-06 ENCOUNTER — Other Ambulatory Visit: Payer: Self-pay | Admitting: Family

## 2015-12-06 NOTE — Telephone Encounter (Signed)
Pt was due for a 1 month follow up BP check. Please send #14 tabs and call patient to set up a follow up appointment.

## 2015-12-06 NOTE — Telephone Encounter (Signed)
Called patient. Left message on answering machine that medication 14 day supply of Norvasc has been sent to pharmacy however, she needs to call to schedule appointment for Follow up BP as soon as possible.

## 2015-12-21 ENCOUNTER — Ambulatory Visit: Payer: BC Managed Care – PPO | Admitting: Family

## 2015-12-25 ENCOUNTER — Encounter: Payer: Self-pay | Admitting: Family

## 2015-12-25 ENCOUNTER — Ambulatory Visit (INDEPENDENT_AMBULATORY_CARE_PROVIDER_SITE_OTHER): Payer: BC Managed Care – PPO | Admitting: Family

## 2015-12-25 VITALS — BP 118/84 | HR 79 | Temp 98.6°F | Resp 18 | Ht 66.5 in | Wt 220.0 lb

## 2015-12-25 DIAGNOSIS — F329 Major depressive disorder, single episode, unspecified: Secondary | ICD-10-CM

## 2015-12-25 DIAGNOSIS — F418 Other specified anxiety disorders: Secondary | ICD-10-CM | POA: Diagnosis not present

## 2015-12-25 DIAGNOSIS — E669 Obesity, unspecified: Secondary | ICD-10-CM | POA: Diagnosis not present

## 2015-12-25 DIAGNOSIS — I10 Essential (primary) hypertension: Secondary | ICD-10-CM

## 2015-12-25 DIAGNOSIS — F419 Anxiety disorder, unspecified: Secondary | ICD-10-CM

## 2015-12-25 LAB — BASIC METABOLIC PANEL WITH GFR
BUN: 11 mg/dL (ref 6–23)
CO2: 26 meq/L (ref 19–32)
Calcium: 9.2 mg/dL (ref 8.4–10.5)
Chloride: 103 meq/L (ref 96–112)
Creatinine, Ser: 0.92 mg/dL (ref 0.40–1.20)
GFR: 88.02 mL/min
Glucose, Bld: 83 mg/dL (ref 70–99)
Potassium: 4 meq/L (ref 3.5–5.1)
Sodium: 136 meq/L (ref 135–145)

## 2015-12-25 MED ORDER — AMLODIPINE BESYLATE 10 MG PO TABS: 10.0000 mg | ORAL_TABLET | Freq: Every day | ORAL | 5 refills | Status: DC

## 2015-12-25 NOTE — Assessment & Plan Note (Signed)
Encouraged her to continue to work on weight loss.

## 2015-12-25 NOTE — Assessment & Plan Note (Signed)
Stable/improved. Continue current medications. Obtain follow up bmet.

## 2015-12-25 NOTE — Progress Notes (Signed)
Pre visit review using our clinic review tool, if applicable. No additional management support is needed unless otherwise documented below in the visit note. 

## 2015-12-25 NOTE — Patient Instructions (Signed)
Please complete blood work prior to leaving. Continue the good work with weight loss.

## 2015-12-25 NOTE — Progress Notes (Signed)
Subjective:    Patient ID: Whitney Harmon, female    DOB: [birth year], 40 y.o.   MRN: [id number]  HPI  Whitney Harmon is a 40 yr old female who presents today for follow up.  HTN-  Reports good compliance with medications. Denies CP/SOB/swelling. BP Readings from Last 3 Encounters:  12/25/15 118/84  10/01/15 (!) 146/102  07/23/15 (!) 166/98   Obesity- continues to lose weight following her bariatric surgery.   Wt Readings from Last 3 Encounters:  12/25/15 220 lb (99.8 kg)  10/01/15 233 lb (105.7 kg)  07/23/15 241 lb 3.2 oz (109.4 kg)   Anxiety/Depression- continues wellbutrin. Reports mood is well controlled. Very rarely using xanax. Reports that it makes her sleepy.  Review of Systems See HPI  Past Medical History:  Diagnosis Date  . Asthma    childhood  . Elevated antinuclear antibody (ANA) level 05/12/2011  . Hypertension   . Overweight(278.02)      Social History   Social History  . Marital status: Single    Spouse name: N/A  . Number of children: 1  . Years of education: N/A   Occupational History  . 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 Smoking status: Never Smoker  . Smokeless tobacco: Never Used  . Alcohol use Yes     Comment: social, once a week  . Drug use: No  . Sexual activity: Not on file   Other Topics Concern  . 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 LAPAROSCOPIC GASTRIC SLEEVE RESECTION  12/31/14  . SALPINGOOPHORECTOMY  2007   left    Family History  Problem Relation Age of Onset  . Arthritis Mother     osteoarthritis  . Diabetes Mother   . Hypertension Mother   . Mental illness Mother     schizophrenia  . Obesity Mother   . Schizophrenia Mother   . Hypertension Father   . Alcohol abuse    . Diabetes    . Hypertension      Allergies  Allergen Reactions  . Fish Oil Anaphylaxis    TUNA OIL  . Peanut Butter Flavor  Anaphylaxis  . Peanuts [Peanut Oil] Itching    Has Epi pen, though states only has itching/hives  . Apple Hives and Itching    Apples  . Vilazodone Hcl Diarrhea    Current Outpatient Prescriptions on File Prior to Visit  Medication Sig Dispense Refill  . albuterol (PROAIR HFA) 108 (90 BASE) MCG/ACT inhaler Inhale 2 puffs into the lungs every 6 (six) hours as needed. For wheezing 1 Inhaler 2  . ALPRAZolam (XANAX) 0.25 MG tablet TAKE ONE TABLET BY MOUTH THREE TIMES DAILY AS NEEDED FOR ANXIETY OR SLEEP 40 tablet 0  . buPROPion (WELLBUTRIN XL) 150 MG 24 hr tablet Take 3 tablets by mouth daily.    . cloNIDine (CATAPRES) 0.1 MG tablet Take 1 tablet (0.1 mg total) by mouth 2 (two) times daily. 180 tablet 1  . Cyanocobalamin (VITAMIN B-12 PO) Take by mouth.    . EPINEPHrine (EPIPEN IJ) Inject as directed as needed.    . Ferrous Sulfate Dried (FERROUS SULFATE CR PO) Take by mouth.    Marland Kitchen FLUoxetine (PROZAC) 20 MG/5ML solution Take 15 mLs (60 mg total) by mouth daily. 450 mL 0  . LO LOESTRIN FE 1 MG-10 MCG / 10 MCG  tablet Take 1 tablet by mouth daily.    Marland Kitchen loratadine (CLARITIN) 10 MG tablet Take 10 mg by mouth daily as needed.     . Multiple Vitamin (MULTIVITAMIN) capsule Take 1 capsule by mouth daily.    . ranitidine (ZANTAC) 150 MG tablet Take 150 mg by mouth 2 (two) times daily.    . sennosides-docusate sodium (SENOKOT-S) 8.6-50 MG tablet Take 1 tablet by mouth 2 (two) times daily.    . ursodiol (ACTIGALL) 300 MG capsule Take 300 mg by mouth.     No current facility-administered medications on file prior to visit.     BP 118/84   Pulse 79   Temp 98.6 F (37 C) (Oral)   Resp 18   Ht 5' 6.5" (1.689 m)   Wt 220 lb (99.8 kg)   LMP 12/10/2015   SpO2 99% Comment: room air  BMI 34.98 kg/m       Objective:   Physical Exam  Constitutional: She is oriented to person, place, and time. She appears well-developed and well-nourished.  Cardiovascular: Normal rate, regular rhythm and normal heart  sounds.   No murmur heard. Pulmonary/Chest: Effort normal and breath sounds normal. No respiratory distress. She has no wheezes.  Musculoskeletal: She exhibits no edema.  Neurological: She is alert and oriented to person, place, and time.  Skin: Skin is warm and dry.  Psychiatric: She has a normal mood and affect. Her behavior is normal. Judgment and thought content normal.          Assessment & Plan:

## 2015-12-25 NOTE — Assessment & Plan Note (Signed)
Stable, monitor. She is followed by Dr. Evelene Croon who told her she does not have BPD- removed this from list at pt request.

## 2016-02-03 ENCOUNTER — Other Ambulatory Visit: Payer: Self-pay | Admitting: Family

## 2016-03-26 ENCOUNTER — Ambulatory Visit: Payer: BC Managed Care – PPO | Admitting: Family

## 2016-03-26 ENCOUNTER — Encounter: Payer: BC Managed Care – PPO | Admitting: Family

## 2016-04-01 ENCOUNTER — Ambulatory Visit (INDEPENDENT_AMBULATORY_CARE_PROVIDER_SITE_OTHER): Payer: Self-pay | Admitting: Family

## 2016-04-01 ENCOUNTER — Encounter: Payer: Self-pay | Admitting: Family

## 2016-04-01 DIAGNOSIS — I10 Essential (primary) hypertension: Secondary | ICD-10-CM

## 2016-04-01 NOTE — Progress Notes (Signed)
Subjective:    Patient ID: Whitney Harmon, female    DOB: 03/28/1976, 40 y.o.   MRN: [id number]  HPI  Whitney Harmon is a 40 yr old female who presets today for follow up of her blood pressure.  She is currently maintained on amlodipine, clonidine.  Reports that she has been checking BP at home and it is usually 120's/80's.   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.  She is finishing school.   Review of Systems    see HPI  Past Medical History:  Diagnosis Date  . Asthma    childhood  . Elevated antinuclear antibody (ANA) level 05/12/2011  . Hypertension   . Overweight(278.02)      Social History   Social History  . Marital status: Single    Spouse name: N/A  . Number of children: 1  . Years of education: N/A   Occupational History  . 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 Smoking status: Never Smoker  . Smokeless tobacco: Never Used  . Alcohol use Yes     Comment: social, once a week  . Drug use: No  . Sexual activity: Not on file   Other Topics Concern  . 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 LAPAROSCOPIC GASTRIC SLEEVE RESECTION  12/31/14  . SALPINGOOPHORECTOMY  2007   left    Family History  Problem Relation Age of Onset  . Arthritis Mother     osteoarthritis  . Diabetes Mother   . Hypertension Mother   . Mental illness Mother     schizophrenia  . Obesity Mother   . Schizophrenia Mother   . Hypertension Father   . Alcohol abuse    . Diabetes    . Hypertension      Allergies  Allergen Reactions  . Fish Oil Anaphylaxis    TUNA OIL  . Peanut Butter Flavor Anaphylaxis  . Peanuts [Peanut Oil] Itching    Has Epi pen, though states only has itching/hives  . Apple Hives and Itching    Apples  . Vilazodone Hcl Diarrhea    Current  Outpatient Prescriptions on File Prior to Visit  Medication Sig Dispense Refill  . albuterol (PROAIR HFA) 108 (90 BASE) MCG/ACT inhaler Inhale 2 puffs into the lungs every 6 (six) hours as needed. For wheezing 1 Inhaler 2  . ALPRAZolam (XANAX) 0.25 MG tablet TAKE ONE TABLET BY MOUTH THREE TIMES DAILY AS NEEDED FOR ANXIETY OR SLEEP 40 tablet 0  . amLODipine (NORVASC) 10 MG tablet Take 1 tablet (10 mg total) by mouth daily. 30 tablet 5  . buPROPion (WELLBUTRIN XL) 150 MG 24 hr tablet Take 3 tablets by mouth daily.    . cloNIDine (CATAPRES) 0.1 MG tablet TAKE ONE TABLET BY MOUTH TWICE DAILY 180 tablet 1  . Cyanocobalamin (VITAMIN B-12 PO) Take by mouth.    . EPINEPHrine (EPIPEN IJ) Inject as directed as needed.    . Ferrous Sulfate Dried (FERROUS SULFATE CR PO) Take by mouth.    Marland Kitchen. FLUoxetine (PROZAC) 20 MG/5ML solution Take 15 mLs (60 mg total) by mouth daily. 450 mL 0  . LO LOESTRIN FE 1 MG-10 MCG / 10 MCG tablet Take 1 tablet by  mouth daily.    Marland Kitchen. loratadine (CLARITIN) 10 MG tablet Take 10 mg by mouth daily as needed.     . Multiple Vitamin (MULTIVITAMIN) capsule Take 1 capsule by mouth daily.    . ranitidine (ZANTAC) 150 MG tablet Take 150 mg by mouth 2 (two) times daily.    . sennosides-docusate sodium (SENOKOT-S) 8.6-50 MG tablet Take 1 tablet by mouth 2 (two) times daily.    . ursodiol (ACTIGALL) 300 MG capsule Take 300 mg by mouth.     No current facility-administered medications on file prior to visit.     BP 139/90 (BP Location: Right Arm, Patient Position: Sitting, Cuff Size: Normal)   Pulse 73   Temp 98.5 F (36.9 C) (Oral)   Resp 16   Ht 5' 6.5" (1.689 m)   Wt 219 lb (99.3 kg)   LMP 03/02/2016 (Approximate)   SpO2 100% Comment: RA  BMI 34.82 kg/m    Objective:   Physical Exam  Constitutional: She is oriented to person, place, and time. She appears well-developed and well-nourished.  HENT:  Head: Normocephalic and atraumatic.  Cardiovascular: Normal rate, regular rhythm  and normal heart sounds.   No murmur heard. Pulmonary/Chest: Effort normal and breath sounds normal. No respiratory distress. She has no wheezes.  Neurological: She is alert and oriented to person, place, and time.  Psychiatric: She has a normal mood and affect. Her behavior is normal. Judgment and thought content normal.          Assessment & Plan:

## 2016-04-01 NOTE — Progress Notes (Signed)
Pre visit review using our clinic review tool, if applicable. No additional management support is needed unless otherwise documented below in the visit note. 

## 2016-04-01 NOTE — Patient Instructions (Signed)
Continue current medications. 

## 2016-04-01 NOTE — Assessment & Plan Note (Signed)
BP a bit high in the office but has been better at home, continue current medications.

## 2016-05-09 ENCOUNTER — Encounter: Payer: BC Managed Care – PPO | Admitting: Family

## 2016-05-09 DIAGNOSIS — Z0289 Encounter for other administrative examinations: Secondary | ICD-10-CM

## 2016-05-13 ENCOUNTER — Telehealth: Payer: Self-pay | Admitting: Family

## 2016-05-13 ENCOUNTER — Encounter: Payer: Self-pay | Admitting: Family

## 2016-05-13 NOTE — Telephone Encounter (Signed)
Yes please

## 2016-05-13 NOTE — Telephone Encounter (Signed)
Patient was No Show on 05/09/2016. Last No Show 6/19 and 3/27 2017. Charge or No Charge?

## 2016-05-29 ENCOUNTER — Encounter: Payer: Self-pay | Admitting: Family

## 2016-06-04 ENCOUNTER — Telehealth: Payer: Self-pay | Admitting: Family

## 2016-06-04 NOTE — Telephone Encounter (Signed)
Patient dismissed from [facility] by Sandford CrazeMelissa O'Sullivan NP , effective May 29, 2016. Dismissal letter sent out by certified / registered mail.  DAJ

## 2016-06-10 NOTE — Telephone Encounter (Signed)
Received signed domestic return receipt verifying delivery of certified letter on June 06, 2016. Article number [id number] DAJ

## 2016-06-16 ENCOUNTER — Ambulatory Visit: Payer: Self-pay | Admitting: Rheumatology

## 2016-07-01 ENCOUNTER — Telehealth: Payer: Self-pay | Admitting: Family

## 2016-07-01 NOTE — Telephone Encounter (Signed)
Whitney Harmon-- pt was dismissed from [facility] in January but there are no flags in her chart to indicate this.  Please advise?

## 2016-07-01 NOTE — Telephone Encounter (Signed)
[facility] - Client  TELEPHONE ADVICE RECORD   [facility]    --------------------------------------------------------------------------------   Patient Name: Whitney Harmon  Gender: Female  DOB: 11/02/1975   Age: [age] Y 2 M 5 D  Return Phone Number: [phone] (Primary)  Address:     City[place]State/Zip: [place]  [id number]   Client [facility] - Client  Client Site [facility] - Day  Physician [name]'Sullivan, Melissa - NP  Contact Type Call  Who Is Calling Patient / Member / Family / Caregiver  Call Type Triage / Clinical  Relationship To Patient Self  Return Phone Number [phone] (Primary)  Chief Complaint Sore Throat  Reason for Call Symptomatic / Request for Health Information  Initial Comment Caller states she has sore throat, with excruciating pain in her ear, hurting to turn head and touch neck. nose is stopped up, coughing up mucus as well. Z-pak, is what she thinks she needs.   Appointment Disposition EMR Caller Not Reached  Info pasted into Epic Yes  Translation No       Nurse Assessment       Guidelines          Guideline Title Affirmed Question Affirmed Notes Nurse Date/Time (Eastern Time)       Disp. Time Lamount Cohen(Eastern Time) Disposition Final User    07/01/2016 2:51:10 PM Send To Clinical Follow Up Horatio PelQueue   Martin, [facility]    07/01/2016 4:00:37 PM Attempt made - no message left   Harlon FlorWhitaker, RN, Darl PikesSusan      07/01/2016 4:17:17 PM FINAL ATTEMPT MADE - no message left Yes Harlon FlorWhitaker, RN, Darl PikesSusan

## 2016-07-01 NOTE — Telephone Encounter (Signed)
Patient Name: Whitney Harmon  DOB: 10/02/1975    Initial Comment Missed cb, Caller has sore throat with excutiating pain in her ear, hurts to turn head and touch neck. Nose is stopped up, coughing up mucus as well. Thinks she needs a Z-pak.    Nurse Assessment  Nurse: Renaldo FiddlerAdkins, RN, Raynelle FanningJulie Date/Time Lamount Cohen(Eastern Time): 07/01/2016 4:51:40 PM  Confirm and document reason for call. If symptomatic, describe symptoms. Caller states she has a sore throat and excruciating pain in her right ear. It hurts to touch her ear, she is coughing and she has nasal congestion. No fever. Her sx began last week with the sore throat and laryngitis, cough and cold sx began on Saturday and the earache today. Denies difficulty breathing or wheezing at this time. Her throat and ear are bothering her the worst.  Does the patient have any new or worsening symptoms? ---Yes  Will a triage be completed? ---Yes  Related visit to physician within the last 2 weeks? ---No  Does the PT have any chronic conditions? (i.e. diabetes, asthma, etc.) ---Yes  List chronic conditions. ---Htn  Is the patient pregnant or possibly pregnant? (Ask all females between the ages of 6012-55) ---No  Is this a behavioral health or substance abuse call? ---No     Guidelines    Guideline Title Affirmed Question Affirmed Notes  Earache [1] SEVERE pain AND [2] not improved 2 hours after taking analgesic medication (e.g., ibuprofen or acetaminophen)    Final Disposition User   See Physician within 4 Hours (or PCP triage) Renaldo FiddlerAdkins, RN, Raynelle FanningJulie    Referrals  GO TO FACILITY OTHER - SPECIFY  GO TO FACILITY OTHER - SPECIFY   Disagree/Comply: Comply

## 2016-07-01 NOTE — Telephone Encounter (Signed)
Patient called stating that she has a sore throat and has had that symptom for a few days but just today she has been having excruciating shooting pain in her ear and it is painful to turn her head or touch her neck. Sent to [facility] to be evaluated.

## 2016-07-04 ENCOUNTER — Telehealth: Payer: Self-pay | Admitting: *Deleted

## 2016-07-04 MED ORDER — AMLODIPINE BESYLATE 10 MG PO TABS: 10.0000 mg | ORAL_TABLET | Freq: Every day | ORAL | 0 refills | Status: AC

## 2016-07-04 NOTE — Telephone Encounter (Signed)
Received fax from [facility] on [address] requesting refill of amlodipine. Pt was discharged from [facility] on 06/04/16. 90 day supply sent to pharmacy to allow pt time to establish with new PCP.

## 2016-07-10 NOTE — Progress Notes (Deleted)
Office Visit Note  Patient: Whitney Harmon             Date of Birth: April 21, 1976           MRN: [id number]             PCP: No primary care provider on file. Referring: [name], NP Visit Date: 07/17/2016 Occupation: @GUAROCC @    Subjective:  No chief complaint on file.   History of Present Illness: Whitney Harmon is a 41 y.o. female ***   Activities of Daily Living:  Patient reports morning stiffness for *** {minute/hour:19697}.   Patient {ACTIONS;DENIES/REPORTS:21021675::"Denies"} nocturnal pain.  Difficulty dressing/grooming: {ACTIONS;DENIES/REPORTS:21021675::"Denies"} Difficulty climbing stairs: {ACTIONS;DENIES/REPORTS:21021675::"Denies"} Difficulty getting out of chair: {ACTIONS;DENIES/REPORTS:21021675::"Denies"} Difficulty using hands for taps, buttons, cutlery, and/or writing: {ACTIONS;DENIES/REPORTS:21021675::"Denies"}   No Rheumatology ROS completed.   PMFS History:  Patient Active Problem List   Diagnosis Date Noted  . Preventative health care 02/19/2015  . ANA positive 08/29/2014  . Bilateral leg edema 01/14/2014  . OCD (obsessive compulsive disorder) 12/26/2013  . Leg wound, left 03/23/2013  . Elevated antinuclear antibody (ANA) level 05/12/2011  . Hidradenitis 02/06/2011  . Acne 09/20/2010  . Weight gain 09/20/2010  . 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 Obesity 04/22/2010  . HYPERHIDROSIS 10/15/2009  . GENITAL HERPES 09/12/2009  . Anxiety and depression 06/18/2009  . Essential hypertension 06/18/2009    Past Medical History:  Diagnosis Date  . Asthma    childhood  . Elevated antinuclear antibody (ANA) level 05/12/2011  . Hypertension   . Overweight(278.02)     Family History  Problem Relation Age of Onset  . Arthritis Mother     osteoarthritis  . Diabetes Mother   . Hypertension Mother   . Mental illness Mother     schizophrenia  . Obesity Mother   . Schizophrenia Mother   . Hypertension Father   . Alcohol abuse    .  Diabetes    . Hypertension     Past Surgical History:  Procedure Laterality Date  . LAPAROSCOPIC GASTRIC SLEEVE RESECTION  12/31/14  . 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   Physical Exam   Musculoskeletal Exam: ***  CDAI Exam: No CDAI exam completed.    Investigation: Findings:    May 2017:  CBC, comprehensive metabolic panel, CK, TSH, UA were normal.  Sed rate was 4.  SPEP was normal.  ANA was 1:160 nuclear homogenous pattern.  ENA was negative.  Complements normal.  Beta-2 anticardiolipin and lupus anticoagulants were all within normal limits.  Rheumatoid factor was negative.  CCP was negative.  Vitamin D was low at 28.    10/04/2015   X-ray of bilateral hands, 2 views, were obtained today, as she has chronic pain.  It did not show any joint space narrowing, erosive changes or osteopenia.  It was within normal limits.  X-ray of bilateral feet, 2 views, also were within normal limits without any joint space narrowing, erosive changes or osteopenia.    12/12/2015 Ultrasound of bilateral hands was performed today.  Per EULAR recommendations ultrasound examination of bilateral hands was performed.  Using 12 megahertz transducer, gray scale and power Doppler, bilateral 2nd, 3rd and 5th MCP joints and bilateral wrist joints, both dorsal and volar aspects, were evaluated to look for any synovitis or tenosynovitis.  Findings  were she had no synovitis or tenosynovitis in her MCP joints or wrist joints.  There was no synovial thickening.  Bilateral median nerves were within normal limits.  The left median nerve was 0.11 cm2, which was slightly more than upper normal limit.  The assessment was that there was no evidence of inflammatory arthritis on ultrasound examination.   No visits with results within 3 Month(s) from this visit.  Latest known visit  with results is:  Office Visit on 12/25/2015  Component Date Value Ref Range Status  . Sodium 12/25/2015 136  135 - 145 mEq/L Final  . Potassium 12/25/2015 4.0  3.5 - 5.1 mEq/L Final  . Chloride 12/25/2015 103  96 - 112 mEq/L Final  . CO2 12/25/2015 26  19 - 32 mEq/L Final  . Glucose, Bld 12/25/2015 83  70 - 99 mg/dL Final  . BUN 21/30/865708/05/2015 11  6 - 23 mg/dL Final  . Creatinine, Ser 12/25/2015 0.92  0.40 - 1.20 mg/dL Final  . Calcium 84/69/629508/05/2015 9.2  8.4 - 10.5 mg/dL Final  . GFR 28/41/324408/05/2015 88.02  >60.00 mL/min Final     Imaging: No results found.  Speciality Comments: No specialty comments available.    Procedures:  No procedures performed Allergies: Fish oil; Peanut butter flavor; Peanuts [peanut oil]; Apple; and Vilazodone hcl   Assessment / Plan:     Visit Diagnoses: Elevated antinuclear antibody (ANA) level    Orders: No orders of the defined types were placed in this encounter.  No orders of the defined types were placed in this encounter.   Face-to-face time spent with patient was *** minutes. 50% of time was spent in counseling and coordination of care.  Follow-Up Instructions: No Follow-up on file.   Eris Breck, RT  Note - This record has been created using [employer] software.  Chart creation errors have been sought, but may not always  have been located. Such creation errors do not reflect on  the standard of medical care.

## 2016-07-17 ENCOUNTER — Ambulatory Visit: Payer: Self-pay | Admitting: Rheumatology

## 2016-07-29 ENCOUNTER — Encounter (HOSPITAL_BASED_OUTPATIENT_CLINIC_OR_DEPARTMENT_OTHER): Payer: Self-pay | Admitting: Emergency Medicine

## 2016-07-29 ENCOUNTER — Emergency Department (HOSPITAL_BASED_OUTPATIENT_CLINIC_OR_DEPARTMENT_OTHER)
Admission: EM | Admit: 2016-07-29 | Discharge: 2016-07-29 | Disposition: A | Discharge status: Home / Self Care | Payer: BLUE CROSS/BLUE SHIELD | Attending: Emergency Medicine | Admitting: Emergency Medicine

## 2016-07-29 DIAGNOSIS — B9689 Other specified bacterial agents as the cause of diseases classified elsewhere: Secondary | ICD-10-CM

## 2016-07-29 DIAGNOSIS — N760 Acute vaginitis: Secondary | ICD-10-CM | POA: Insufficient documentation

## 2016-07-29 DIAGNOSIS — I10 Essential (primary) hypertension: Secondary | ICD-10-CM | POA: Insufficient documentation

## 2016-07-29 DIAGNOSIS — J45909 Unspecified asthma, uncomplicated: Secondary | ICD-10-CM | POA: Diagnosis not present

## 2016-07-29 DIAGNOSIS — R300 Dysuria: Secondary | ICD-10-CM | POA: Diagnosis present

## 2016-07-29 LAB — URINALYSIS, ROUTINE W REFLEX MICROSCOPIC
GLUCOSE, UA: NEGATIVE mg/dL
HGB URINE DIPSTICK: NEGATIVE
KETONES UR: 15 mg/dL — AB
Nitrite: NEGATIVE
PH: 6 (ref 5.0–8.0)
Protein, ur: NEGATIVE mg/dL
Specific Gravity, Urine: 1.024 (ref 1.005–1.030)

## 2016-07-29 LAB — WET PREP, GENITAL
Sperm: NONE SEEN
Trich, Wet Prep: NONE SEEN
Yeast Wet Prep HPF POC: NONE SEEN

## 2016-07-29 LAB — URINALYSIS, MICROSCOPIC (REFLEX): RBC / HPF: NONE SEEN RBC/hpf (ref 0–5)

## 2016-07-29 LAB — PREGNANCY, URINE: PREG TEST UR: NEGATIVE

## 2016-07-29 MED ORDER — METRONIDAZOLE 500 MG PO TABS: 500.0000 mg | ORAL_TABLET | Freq: Two times a day (BID) | ORAL | 0 refills | Status: DC

## 2016-07-29 NOTE — ED Notes (Signed)
ED Provider at bedside. 

## 2016-07-29 NOTE — ED Provider Notes (Signed)
[facility] Provider Note   CSN: [id number] Arrival date & time: 07/29/16  1943   By signing my name below, I, Freida Busman, attest that this documentation has been prepared under the direction and in the presence of Tilden Fossa, MD . Electronically Signed: Freida Busman, Scribe. 07/29/2016. 9:18 PM.   History   Chief Complaint Chief Complaint  Patient presents with  . Dysuria    The history is provided by the patient. No language interpreter was used.     HPI Comments:  Whitney Harmon is a 41 y.o. female who presents to the Emergency Department complaining of vaginal discharge x 1 day. She reports associated mild dysuria. She reports h/o BV and UTI; symptoms today are similar. No alleviating factors noted. She is sexually active with 1 female partner; no use of condoms with last sexual encounter. Pt denies fever, vomiting, and diarrhea.   Past Medical History:  Diagnosis Date  . Asthma    childhood  . Elevated antinuclear antibody (ANA) level 05/12/2011  . Hypertension   . Overweight(278.02)     Patient Active Problem List   Diagnosis Date Noted  . Preventative health care 02/19/2015  . ANA positive 08/29/2014  . Bilateral leg edema 01/14/2014  . OCD (obsessive compulsive disorder) 12/26/2013  . Leg wound, left 03/23/2013  . Elevated antinuclear antibody (ANA) level 05/12/2011  . Hidradenitis 02/06/2011  . Acne 09/20/2010  . Weight gain 09/20/2010  . 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 Obesity 04/22/2010  . HYPERHIDROSIS 10/15/2009  . GENITAL HERPES 09/12/2009  . Anxiety and depression 06/18/2009  . Essential hypertension 06/18/2009    Past Surgical History:  Procedure Laterality Date  . LAPAROSCOPIC GASTRIC SLEEVE RESECTION  12/31/14  . SALPINGOOPHORECTOMY  2007   left    OB History    No data available       Home Medications    Prior to Admission medications   Medication Sig Start Date End Date Taking? Authorizing Provider  albuterol  (PROAIR HFA) 108 (90 BASE) MCG/ACT inhaler Inhale 2 puffs into the lungs every 6 (six) hours as needed. For wheezing 08/20/13   Grayling Congress Lowne Chase, DO  ALPRAZolam (XANAX) 0.25 MG tablet TAKE ONE TABLET BY MOUTH THREE TIMES DAILY AS NEEDED FOR ANXIETY OR SLEEP    [name], NP  amLODipine (NORVASC) 10 MG tablet Take 1 tablet (10 mg total) by mouth daily. 07/04/16   [name], NP  buPROPion (WELLBUTRIN XL) 150 MG 24 hr tablet Take 3 tablets by mouth daily. 09/28/15   Historical Provider, MD  cloNIDine (CATAPRES) 0.1 MG tablet TAKE ONE TABLET BY MOUTH TWICE DAILY 02/04/16   [name], NP  Cyanocobalamin (VITAMIN B-12 PO) Take by mouth.    Historical Provider, MD  EPINEPHrine (EPIPEN IJ) Inject as directed as needed.    Historical Provider, MD  Ferrous Sulfate Dried (FERROUS SULFATE CR PO) Take by mouth.    Historical Provider, MD  FLUoxetine (PROZAC) 20 MG/5ML solution Take 15 mLs (60 mg total) by mouth daily. 01/10/15   [name], NP  LO LOESTRIN FE 1 MG-10 MCG / 10 MCG tablet Take 1 tablet by mouth daily. 09/21/15   Historical Provider, MD  loratadine (CLARITIN) 10 MG tablet Take 10 mg by mouth daily as needed.     Historical Provider, MD  metroNIDAZOLE (FLAGYL) 500 MG tablet Take 1 tablet (500 mg total) by mouth 2 (two) times daily. 07/29/16   Tilden Fossa, MD  Multiple Vitamin (MULTIVITAMIN) capsule Take 1  capsule by mouth daily.    Historical Provider, MD  ranitidine (ZANTAC) 150 MG tablet Take 150 mg by mouth 2 (two) times daily.    Historical Provider, MD  sennosides-docusate sodium (SENOKOT-S) 8.6-50 MG tablet Take 1 tablet by mouth 2 (two) times daily.    Historical Provider, MD  ursodiol (ACTIGALL) 300 MG capsule Take 300 mg by mouth. 12/14/14   Historical Provider, MD    Family History Family History  Problem Relation Age of Onset  . Arthritis Mother     osteoarthritis  . Diabetes Mother   . Hypertension Mother   . Mental illness Mother     schizophrenia   . Obesity Mother   . Schizophrenia Mother   . Hypertension Father   . Alcohol abuse    . Diabetes    . Hypertension      Social History Social History  Substance Use Topics  . Smoking status: Never Smoker  . Smokeless tobacco: Never Used  . Alcohol use Yes     Comment: social, once a week     Allergies   Fish oil; Peanut butter flavor; Peanuts [peanut oil]; Apple; and Vilazodone hcl   Review of Systems Review of Systems  Constitutional: Negative for fever.  Gastrointestinal: Negative for diarrhea and vomiting.  Genitourinary: Positive for dysuria and vaginal discharge.  All other systems reviewed and are negative.    Physical Exam Updated Vital Signs BP 145/94 (BP Location: Right Arm)   Pulse (!) 57   Temp 98.3 F (36.8 C) (Oral)   Resp 18   Ht 5\' 7"  (1.702 m)   Wt 216 lb (98 kg)   LMP 07/01/2016 (Approximate)   SpO2 100%   BMI 33.83 kg/m   Physical Exam  Constitutional: She is oriented to person, place, and time. She appears well-developed and well-nourished.  HENT:  Head: Normocephalic and atraumatic.  Cardiovascular: Normal rate and regular rhythm.   No murmur heard. Pulmonary/Chest: Effort normal and breath sounds normal. No respiratory distress.  Abdominal: Soft. There is no tenderness. There is no rebound and no guarding.  Genitourinary:  Genitourinary Comments: Scant vaginal discharge. No CMT. Os closed Chaperone was present for exam.   Musculoskeletal: She exhibits no edema or tenderness.  Neurological: She is alert and oriented to person, place, and time.  Skin: Skin is warm and dry.  Psychiatric: She has a normal mood and affect. Her behavior is normal.  Nursing note and vitals reviewed.    ED Treatments / Results  DIAGNOSTIC STUDIES:  Oxygen Saturation is 99% on RA, normal by my interpretation.    COORDINATION OF CARE:  9:02 PM Discussed treatment plan with pt at bedside. Pt declines HIV and syphilis testing but agreed to pelvic and  UA.   Labs (all labs ordered are listed, but only abnormal results are displayed) Labs Reviewed  WET PREP, GENITAL - Abnormal; Notable for the following:       Result Value   Clue Cells Wet Prep HPF POC PRESENT (*)    WBC, Wet Prep HPF POC MANY (*)    All other components within normal limits  URINALYSIS, ROUTINE W REFLEX MICROSCOPIC - Abnormal; Notable for the following:    Bilirubin Urine SMALL (*)    Ketones, ur 15 (*)    Leukocytes, UA TRACE (*)    All other components within normal limits  URINALYSIS, MICROSCOPIC (REFLEX) - Abnormal; Notable for the following:    Bacteria, UA MANY (*)    Squamous Epithelial / LPF  0-5 (*)    All other components within normal limits  URINE CULTURE  PREGNANCY, URINE  GC/CHLAMYDIA PROBE AMP ([place]) NOT AT [facility]    EKG  EKG Interpretation None       Radiology No results found.  Procedures Procedures (including critical care time)  Medications Ordered in ED Medications - No data to display   Initial Impression / Assessment and Plan / ED Course  I have reviewed the triage vital signs and the nursing notes.  Pertinent labs & imaging results that were available during my care of the patient were reviewed by me and considered in my medical decision making (see chart for details).     Patient here for vaginal discharge as well as discomfort with urination. UA with bacteria but no other evidence of infection, was sent for culture. Pelvic exam with scant vaginal discharge but clue cells present. Given her symptoms will treat for BV with Flagyl. Counseled patient on home care for BV, outpatient follow-up and return precautions.  Final Clinical Impressions(s) / ED Diagnoses   Final diagnoses:  BV (bacterial vaginosis)    New Prescriptions Discharge Medication List as of 07/29/2016 10:46 PM    START taking these medications   Details  metroNIDAZOLE (FLAGYL) 500 MG tablet Take 1 tablet (500 mg total) by mouth 2 (two) times daily.,  Starting Tue 07/29/2016, Print       I personally performed the services described in this documentation, which was scribed in my presence. The recorded information has been reviewed and is accurate.     Tilden Fossa, MD 07/29/16 [phone]

## 2016-07-29 NOTE — ED Notes (Signed)
Unable to provide urine sample at the moment 

## 2016-07-29 NOTE — ED Triage Notes (Signed)
Pt reports discomfort when urinating and states she wants to be checked for a bacterial infection due to vaginal discharge. Pt unable to provide urine sample at this time.

## 2016-07-31 LAB — URINE CULTURE: Culture: NO GROWTH

## 2016-07-31 LAB — GC/CHLAMYDIA PROBE AMP (~~LOC~~) NOT AT ARMC
Chlamydia: NEGATIVE
NEISSERIA GONORRHEA: NEGATIVE

## 2016-12-05 NOTE — Progress Notes (Signed)
Office Visit Note  Patient: Whitney Harmon             Date of Birth: 1975-12-16           MRN: [id number]             PCP: [name], NP Referring: [name], NP Visit Date: 12/08/2016 Occupation: @GUAROCC @    Subjective:  Follow-up (ANA positive, doing good)   History of Present Illness: Whitney Harmon is a 41 y.o. female with history of positive ANA and fibromyalgia syndrome. She states she gets episodic pain in her extremities. She denies any joint swelling. She had some recent rash when she was exposed to the sun. She has had intentional weight loss over the last few months.  Activities of Daily Living:  Patient reports morning stiffness for 0 minute.   Patient Denies nocturnal pain.  Difficulty dressing/grooming: Denies Difficulty climbing stairs: Denies Difficulty getting out of chair: Denies Difficulty using hands for taps, buttons, cutlery, and/or writing: Denies   Review of Systems  Constitutional: Positive for fatigue and weight loss. Negative for night sweats, weight gain and weakness.  HENT: Negative for mouth sores, trouble swallowing, trouble swallowing, mouth dryness and nose dryness.   Eyes: Negative for pain, redness, visual disturbance and dryness.  Respiratory: Negative for cough, shortness of breath and difficulty breathing.   Cardiovascular: Negative for chest pain, palpitations, hypertension, irregular heartbeat and swelling in legs/feet.  Gastrointestinal: Negative for blood in stool, constipation and diarrhea.  Endocrine: Negative for increased urination.  Genitourinary: Negative for vaginal dryness.  Musculoskeletal: Positive for arthralgias, joint pain, myalgias and myalgias. Negative for joint swelling, muscle weakness, morning stiffness and muscle tenderness.  Skin: Positive for hair loss and sensitivity to sunlight. Negative for color change, rash, skin tightness and ulcers.  Allergic/Immunologic: Negative for susceptible to  infections.  Neurological: Negative for dizziness, memory loss and night sweats.  Hematological: Negative for swollen glands.  Psychiatric/Behavioral: Negative for depressed mood and sleep disturbance. The patient is nervous/anxious.     PMFS History:  Patient Active Problem List   Diagnosis Date Noted  . Fibromyalgia 12/06/2016  . Vitamin D deficiency 12/06/2016  . History of asthma 12/06/2016  . Preventative health care 02/19/2015  . ANA positive 08/29/2014  . Bilateral leg edema 01/14/2014  . OCD (obsessive compulsive disorder) 12/26/2013  . Leg wound, left 03/23/2013  . Elevated antinuclear antibody (ANA) level 05/12/2011  . Hidradenitis 02/06/2011  . Acne 09/20/2010  . Weight gain 09/20/2010  . 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 Obesity 04/22/2010  . HYPERHIDROSIS 10/15/2009  . GENITAL HERPES 09/12/2009  . Anxiety and depression 06/18/2009  . Essential hypertension 06/18/2009    Past Medical History:  Diagnosis Date  . Asthma    childhood  . Elevated antinuclear antibody (ANA) level 05/12/2011  . Hypertension   . Overweight(278.02)     Family History  Problem Relation Age of Onset  . Arthritis Mother        osteoarthritis  . Diabetes Mother   . Hypertension Mother   . Mental illness Mother        schizophrenia  . Obesity Mother   . Schizophrenia Mother   . Hypertension Father   . Alcohol abuse Unknown   . Diabetes Unknown   . Hypertension Unknown    Past Surgical History:  Procedure Laterality Date  . LAPAROSCOPIC GASTRIC SLEEVE RESECTION  12/31/14  . 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 She appears  well-developed and well-nourished.  HENT:  Head: Normocephalic and atraumatic.  Eyes: Conjunctivae and EOM are normal.  Neck: Normal range of motion.  Cardiovascular: Normal rate, regular rhythm, normal heart sounds and intact distal pulses.   Pulmonary/Chest: Effort normal and breath sounds normal.  Abdominal: Soft. Bowel sounds are normal.  Lymphadenopathy:    She has no cervical adenopathy.  Neurological: She is alert and oriented to person, place, and time.  Skin: Skin is warm and dry. Capillary refill takes less than 2 seconds.  Psychiatric: She has a normal mood and affect. Her behavior is normal.  Nursing note and vitals reviewed.    Musculoskeletal Exam: C-spine and thoracic lumbar spine good range of motion. Shoulder joints elbow joints wrist joint MCPs PIPs DIPs with good range of motion with no synovitis. Hip joints knee joints ankles MTPs PIPs DIPs were good range of motion with no synovitis. Fibromyalgia tender points were 18 out of 18 positive.  CDAI Exam: No CDAI exam completed.    Investigation: Findings:  May 2017:  CBC, comprehensive metabolic panel, CK, TSH, UA were normal.  Sed rate was 4.  SPEP was normal.  ANA was 1:160 nuclear homogenous pattern.  ENA was negative.  Complements normal.  Beta-2 anticardiolipin and lupus anticoagulants were all within normal limits.  Rheumatoid factor was negative.  CCP was negative.  Vitamin D was low at 28.   12/12/2015  Ultrasound of bilateral hands was performed today.  Per EULAR recommendations ultrasound examination of bilateral hands was performed.  Using 12 megahertz transducer, gray scale and power Doppler, bilateral 2nd, 3rd and 5th MCP joints and bilateral wrist joints, both dorsal and volar aspects, were evaluated to look for any synovitis or tenosynovitis.  Findings were she had no synovitis or tenosynovitis in her MCP joints or wrist joints.  There was no synovial thickening.  Bilateral median nerves were within normal  limits.  The left median nerve was 0.11 cm2, which was slightly more than upper normal limit.  The assessment was that there was no evidence of inflammatory arthritis on ultrasound examination.    No results found.  Speciality Comments: No specialty comments available.    Procedures:  No procedures performed Allergies: Fish oil; Peanut butter flavor; Peanuts [peanut oil]; Apple; and Vilazodone hcl   Assessment / Plan:     Visit Diagnoses: ANA positive1:160 nuclear homogenous pattern.  ENA was negative -she gives history of intermittent arthralgias and rash and recent hair loss. I'll obtain following labs today. Plan: CBC with Differential/Platelet, COMPLETE METABOLIC PANEL WITH GFR, Urinalysis, Routine w reflex microscopic, Sedimentation rate, C3 and C4, Anti-DNA antibody, double-stranded, ANA  Fibromyalgia: She continues to have some generalized pain and discomfort related to fibromyalgia. Need for regular exercise was discussed.  Vitamin D deficiency. She has history of vitamin D deficiency in the past I will repeat her levels today. - Plan: VITAMIN D 25 Hydroxy (Vit-D Deficiency, Fractures)  History of hidradenitis suppurativa  History of hypertension: Her blood pressure was elevated today have advised her to monitor blood pressure closely and follow up  with her PCP.  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 She had intentional weight loss over the last 1-1/2 year.   Orders: Orders Placed This Encounter  Procedures  . CBC with Differential/Platelet  . COMPLETE METABOLIC PANEL WITH GFR  . Urinalysis, Routine w reflex microscopic  . Sedimentation rate  . C3 and C4  . Anti-DNA antibody, double-stranded  . ANA  . VITAMIN D 25 Hydroxy (Vit-D Deficiency, Fractures)   No orders of the defined types were placed in this encounter.   Face-to-face time spent with patient was 30 minutes. 50% of time was spent  in counseling and coordination of care.  Follow-Up Instructions: Return in about 1 year (around 12/08/2017) for FMS Fatigue arthralgias +ANA.   Pollyann SavoyShaili Assia Meanor, MD  Note - This record has been created using Animal nutritionistDragon software.  Chart creation errors have been sought, but may not always  have been located. Such creation errors do not reflect on  the standard of medical care.

## 2016-12-06 DIAGNOSIS — E559 Vitamin D deficiency, unspecified: Secondary | ICD-10-CM | POA: Insufficient documentation

## 2016-12-06 DIAGNOSIS — Z8709 Personal history of other diseases of the respiratory system: Secondary | ICD-10-CM | POA: Insufficient documentation

## 2016-12-06 DIAGNOSIS — M797 Fibromyalgia: Secondary | ICD-10-CM | POA: Insufficient documentation

## 2016-12-08 ENCOUNTER — Ambulatory Visit (INDEPENDENT_AMBULATORY_CARE_PROVIDER_SITE_OTHER): Payer: BLUE CROSS/BLUE SHIELD | Admitting: Rheumatology

## 2016-12-08 ENCOUNTER — Encounter: Payer: Self-pay | Admitting: Rheumatology

## 2016-12-08 VITALS — BP 135/94 | HR 88 | Resp 15 | Ht 67.0 in | Wt 207.0 lb

## 2016-12-08 DIAGNOSIS — R634 Abnormal weight loss: Secondary | ICD-10-CM

## 2016-12-08 DIAGNOSIS — M797 Fibromyalgia: Secondary | ICD-10-CM

## 2016-12-08 DIAGNOSIS — R768 Other specified abnormal immunological findings in serum: Secondary | ICD-10-CM | POA: Diagnosis not present

## 2016-12-08 DIAGNOSIS — R7689 Other specified abnormal immunological findings in serum: Secondary | ICD-10-CM

## 2016-12-08 DIAGNOSIS — E559 Vitamin D deficiency, unspecified: Secondary | ICD-10-CM | POA: Diagnosis not present

## 2016-12-08 DIAGNOSIS — Z872 Personal history of diseases of the skin and subcutaneous tissue: Secondary | ICD-10-CM

## 2016-12-08 DIAGNOSIS — Z8679 Personal history of other diseases of the circulatory system: Secondary | ICD-10-CM

## 2016-12-08 DIAGNOSIS — Z8659 Personal history of other mental and behavioral disorders: Secondary | ICD-10-CM

## 2016-12-08 DIAGNOSIS — Z8709 Personal history of other diseases of the respiratory system: Secondary | ICD-10-CM

## 2016-12-08 LAB — CBC WITH DIFFERENTIAL/PLATELET
BASOS ABS: 0 {cells}/uL (ref 0–200)
Basophils Relative: 0 %
EOS PCT: 1 %
Eosinophils Absolute: 97 cells/uL (ref 15–500)
HEMATOCRIT: 42.6 % (ref 35.0–45.0)
HEMOGLOBIN: 14.1 g/dL (ref 11.7–15.5)
LYMPHS ABS: 2231 {cells}/uL (ref 850–3900)
Lymphocytes Relative: 23 %
MCH: 25.2 pg — ABNORMAL LOW (ref 27.0–33.0)
MCHC: 33.1 g/dL (ref 32.0–36.0)
MCV: 76.2 fL — ABNORMAL LOW (ref 80.0–100.0)
MONO ABS: 485 {cells}/uL (ref 200–950)
MPV: 9.6 fL (ref 7.5–12.5)
Monocytes Relative: 5 %
NEUTROS PCT: 71 %
Neutro Abs: 6887 cells/uL (ref 1500–7800)
Platelets: 385 10*3/uL (ref 140–400)
RBC: 5.59 MIL/uL — ABNORMAL HIGH (ref 3.80–5.10)
RDW: 14.2 % (ref 11.0–15.0)
WBC: 9.7 10*3/uL (ref 3.8–10.8)

## 2016-12-08 LAB — COMPLETE METABOLIC PANEL WITH GFR
ALBUMIN: 4 g/dL (ref 3.6–5.1)
ALK PHOS: 55 U/L (ref 33–115)
ALT: 9 U/L (ref 6–29)
AST: 11 U/L (ref 10–30)
BUN: 11 mg/dL (ref 7–25)
CO2: 28 mmol/L (ref 20–31)
Calcium: 9.5 mg/dL (ref 8.6–10.2)
Chloride: 104 mmol/L (ref 98–110)
Creat: 1.05 mg/dL (ref 0.50–1.10)
GFR, EST AFRICAN AMERICAN: 78 mL/min (ref >=60)
GFR, EST NON AFRICAN AMERICAN: 68 mL/min (ref >=60)
Glucose, Bld: 79 mg/dL (ref 65–99)
POTASSIUM: 3.8 mmol/L (ref 3.5–5.3)
Sodium: 141 mmol/L (ref 135–146)
Total Bilirubin: 0.4 mg/dL (ref 0.2–1.2)
Total Protein: 7.1 g/dL (ref 6.1–8.1)

## 2016-12-09 ENCOUNTER — Other Ambulatory Visit: Payer: Self-pay | Admitting: Radiology

## 2016-12-09 DIAGNOSIS — R7989 Other specified abnormal findings of blood chemistry: Secondary | ICD-10-CM

## 2016-12-09 LAB — URINALYSIS, ROUTINE W REFLEX MICROSCOPIC
Glucose, UA: NEGATIVE
Hgb urine dipstick: NEGATIVE
Nitrite: NEGATIVE
Specific Gravity, Urine: 1.029 (ref 1.001–1.035)
pH: 6.5 (ref 5.0–8.0)

## 2016-12-09 LAB — URINALYSIS, MICROSCOPIC ONLY
Casts: NONE SEEN [LPF]
Crystals: NONE SEEN [HPF]
WBC, UA: >60 WBC/HPF — AB (ref <=5)
YEAST: NONE SEEN [HPF]

## 2016-12-09 LAB — SEDIMENTATION RATE: SED RATE: 1 mm/h (ref 0–20)

## 2016-12-09 LAB — VITAMIN D 25 HYDROXY (VIT D DEFICIENCY, FRACTURES): Vit D, 25-Hydroxy: 18 ng/mL — ABNORMAL LOW (ref 30–100)

## 2016-12-09 LAB — ANTI-DNA ANTIBODY, DOUBLE-STRANDED: DS DNA AB: 1 [IU]/mL

## 2016-12-09 LAB — ANTI-NUCLEAR AB-TITER (ANA TITER)

## 2016-12-09 LAB — ANA: Anti Nuclear Antibody(ANA): POSITIVE — AB

## 2016-12-09 LAB — C3 AND C4
C3 Complement: 116 mg/dL (ref 83–193)
C4 Complement: 30 mg/dL (ref 15–57)

## 2016-12-09 MED ORDER — VITAMIN D3 1.25 MG (50000 UT) PO CAPS: ORAL_CAPSULE | ORAL | 0 refills | Status: DC

## 2016-12-09 NOTE — Progress Notes (Signed)
I called patient and discussed her lab results. She will: See her PCP tomorrow regarding urinary tract infection. She was recently given antibiotics and she was uncertain what she was given. It'll be important for her to have a urine culture and then given the right antibiotics. Please forward the labs to her PCP. Her vitamin D is very low please call in vitamin D 50,000 units twice a week for 90 days. Recheck vitamin D level in 3 months.

## 2016-12-11 ENCOUNTER — Other Ambulatory Visit: Payer: Self-pay | Admitting: *Deleted

## 2016-12-11 MED ORDER — VITAMIN D3 1.25 MG (50000 UT) PO CAPS: ORAL_CAPSULE | ORAL | 0 refills | Status: DC

## 2016-12-11 NOTE — Progress Notes (Signed)
Please call to check on patient. See my previous passage

## 2016-12-12 ENCOUNTER — Telehealth: Payer: Self-pay

## 2016-12-12 NOTE — Telephone Encounter (Signed)
FYI-  Patient was calling to let Dr. Corliss Skainseveshwar know that she had went to see her PCP.  Stated that her Urine was fine today and has to go back to see her PCP on Friday 12/19/16 and was given an antibiotic by her PCP.  CB# is [phone].

## 2017-03-05 ENCOUNTER — Other Ambulatory Visit: Payer: Self-pay | Admitting: Rheumatology

## 2017-08-21 NOTE — Progress Notes (Deleted)
Office Visit Note  Patient: Whitney Harmon             Date of Birth: 09/29/1975           MRN: [id number]             PCP: No primary care provider on file. Referring: No ref. provider found Visit Date: 08/25/2017 Occupation: @GUAROCC @    Subjective:  No chief complaint on file.   History of Present Illness: Whitney Harmon is a 42 y.o. female ***   Activities of Daily Living:  Patient reports morning stiffness for *** {minute/hour:19697}.   Patient {ACTIONS;DENIES/REPORTS:21021675::"Denies"} nocturnal pain.  Difficulty dressing/grooming: {ACTIONS;DENIES/REPORTS:21021675::"Denies"} Difficulty climbing stairs: {ACTIONS;DENIES/REPORTS:21021675::"Denies"} Difficulty getting out of chair: {ACTIONS;DENIES/REPORTS:21021675::"Denies"} Difficulty using hands for taps, buttons, cutlery, and/or writing: {ACTIONS;DENIES/REPORTS:21021675::"Denies"}   No Rheumatology ROS completed.   PMFS History:  Patient Active Problem List   Diagnosis Date Noted  . Fibromyalgia 12/06/2016  . Vitamin D deficiency 12/06/2016  . History of asthma 12/06/2016  . Preventative health care 02/19/2015  . ANA positive 08/29/2014  . Bilateral leg edema 01/14/2014  . OCD (obsessive compulsive disorder) 12/26/2013  . Leg wound, left 03/23/2013  . Elevated antinuclear antibody (ANA) level 05/12/2011  . Hidradenitis 02/06/2011  . Acne 09/20/2010  . Weight gain 09/20/2010  . 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 Obesity 04/22/2010  . HYPERHIDROSIS 10/15/2009  . GENITAL HERPES 09/12/2009  . Anxiety and depression 06/18/2009  . Essential hypertension 06/18/2009    Past Medical History:  Diagnosis Date  . Asthma    childhood  . Elevated antinuclear antibody (ANA) level 05/12/2011  . Hypertension   . Overweight(278.02)     Family History  Problem Relation Age of Onset  . Arthritis Mother        osteoarthritis  . Diabetes Mother   . Hypertension Mother   . Mental illness Mother        schizophrenia   . Obesity Mother   . Schizophrenia Mother   . Hypertension Father   . Alcohol abuse Unknown   . Diabetes Unknown   . Hypertension Unknown    Past Surgical History:  Procedure Laterality Date  . LAPAROSCOPIC GASTRIC SLEEVE RESECTION  12/31/14  . 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   Physical Exam   Musculoskeletal Exam: ***  CDAI Exam: No CDAI exam completed.    Investigation: No additional findings. CBC Latest Ref Rng & Units 12/08/2016 02/19/2015 09/16/2012  WBC 3.8 - 10.8 K/uL 9.7 9.1 13.4(H)  Hemoglobin 11.7 - 15.5 g/dL 40.914.1 11.8(L) 12.9  Hematocrit 35.0 - 45.0 % 42.6 36.8 38.4  Platelets 140 - 400 K/uL 385 291.0 377   CMP Latest Ref Rng & Units 12/08/2016 12/25/2015 02/19/2015  Glucose 65 - 99 mg/dL 79 83 90  BUN 7 - 25 mg/dL 11 11 7   Creatinine 0.50 - 1.10 mg/dL 8.111.05 9.140.92 7.820.78  Sodium 135 - 146 mmol/L 141 136 140  Potassium 3.5 - 5.3 mmol/L 3.8 4.0 3.6  Chloride 98 - 110 mmol/L 104 103 104  CO2 20 - 31 mmol/L 28 26 27   Calcium 8.6 - 10.2 mg/dL 9.5 9.2 9.2  Total Protein 6.1 - 8.1 g/dL 7.1 - 6.9  Total Bilirubin 0.2 - 1.2 mg/dL 0.4 - 0.6  Alkaline Phos 33 - 115 U/L 55 - 69  AST 10 - 30 U/L 11 - 12  ALT 6 - 29 U/L 9 - 14    Imaging: No results found.  Speciality Comments: No specialty comments available.    Procedures:  No procedures performed Allergies: Fish oil; Peanut butter flavor; Peanuts [peanut oil]; Apple; and Vilazodone hcl   Assessment / Plan:     Visit Diagnoses: No diagnosis found.    Orders: No orders of the defined types were placed in this encounter.  No orders of the defined types were placed in this encounter.   Face-to-face time spent with patient was *** minutes. 50% of time was spent in counseling and coordination of care.  Follow-Up Instructions: No follow-ups on file.   Ellen Henri, CMA  Note - This record has been created using Animal nutritionist.  Chart creation errors have been sought, but may not always  have been located. Such creation errors do not reflect on  the standard of medical care.

## 2017-08-25 ENCOUNTER — Ambulatory Visit: Payer: BLUE CROSS/BLUE SHIELD | Admitting: Rheumatology

## 2017-08-25 NOTE — Progress Notes (Deleted)
Office Visit Note  Patient: Whitney Harmon             Date of Birth: 1975/11/12           MRN: [id number]             PCP: No primary care provider on file. Referring: No ref. provider found Visit Date: 09/01/2017 Occupation: @GUAROCC @    Subjective:  No chief complaint on file.   History of Present Illness: Whitney Harmon is a 42 y.o. female ***   Activities of Daily Living:  Patient reports morning stiffness for *** {minute/hour:19697}.   Patient {ACTIONS;DENIES/REPORTS:21021675::"Denies"} nocturnal pain.  Difficulty dressing/grooming: {ACTIONS;DENIES/REPORTS:21021675::"Denies"} Difficulty climbing stairs: {ACTIONS;DENIES/REPORTS:21021675::"Denies"} Difficulty getting out of chair: {ACTIONS;DENIES/REPORTS:21021675::"Denies"} Difficulty using hands for taps, buttons, cutlery, and/or writing: {ACTIONS;DENIES/REPORTS:21021675::"Denies"}   No Rheumatology ROS completed.   PMFS History:  Patient Active Problem List   Diagnosis Date Noted  . Fibromyalgia 12/06/2016  . Vitamin D deficiency 12/06/2016  . History of asthma 12/06/2016  . Preventative health care 02/19/2015  . ANA positive 08/29/2014  . Bilateral leg edema 01/14/2014  . OCD (obsessive compulsive disorder) 12/26/2013  . Leg wound, left 03/23/2013  . Elevated antinuclear antibody (ANA) level 05/12/2011  . Hidradenitis 02/06/2011  . Acne 09/20/2010  . Weight gain 09/20/2010  . 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 Obesity 04/22/2010  . HYPERHIDROSIS 10/15/2009  . GENITAL HERPES 09/12/2009  . Anxiety and depression 06/18/2009  . Essential hypertension 06/18/2009    Past Medical History:  Diagnosis Date  . Asthma    childhood  . Elevated antinuclear antibody (ANA) level 05/12/2011  . Hypertension   . Overweight(278.02)     Family History  Problem Relation Age of Onset  . Arthritis Mother        osteoarthritis  . Diabetes Mother   . Hypertension Mother   . Mental illness Mother        schizophrenia   . Obesity Mother   . Schizophrenia Mother   . Hypertension Father   . Alcohol abuse Unknown   . Diabetes Unknown   . Hypertension Unknown    Past Surgical History:  Procedure Laterality Date  . LAPAROSCOPIC GASTRIC SLEEVE RESECTION  12/31/14  . 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   Physical Exam   Musculoskeletal Exam: ***  CDAI Exam: No CDAI exam completed.    Investigation: No additional findings. CBC Latest Ref Rng & Units 12/08/2016 02/19/2015 09/16/2012  WBC 3.8 - 10.8 K/uL 9.7 9.1 13.4(H)  Hemoglobin 11.7 - 15.5 g/dL 78.2 11.8(L) 12.9  Hematocrit 35.0 - 45.0 % 42.6 36.8 38.4  Platelets 140 - 400 K/uL 385 291.0 377   CMP Latest Ref Rng & Units 12/08/2016 12/25/2015 02/19/2015  Glucose 65 - 99 mg/dL 79 83 90  BUN 7 - 25 mg/dL 11 11 7   Creatinine 0.50 - 1.10 mg/dL 9.56 2.13 0.86  Sodium 135 - 146 mmol/L 141 136 140  Potassium 3.5 - 5.3 mmol/L 3.8 4.0 3.6  Chloride 98 - 110 mmol/L 104 103 104  CO2 20 - 31 mmol/L 28 26 27   Calcium 8.6 - 10.2 mg/dL 9.5 9.2 9.2  Total Protein 6.1 - 8.1 g/dL 7.1 - 6.9  Total Bilirubin 0.2 - 1.2 mg/dL 0.4 - 0.6  Alkaline Phos 33 - 115 U/L 55 - 69  AST 10 - 30 U/L 11 - 12  ALT 6 - 29 U/L 9 - 14    Imaging: No results found.  Speciality Comments: No specialty comments available.    Procedures:  No procedures performed Allergies: Fish oil; Peanut butter flavor; Peanuts [peanut oil]; Apple; and Vilazodone hcl   Assessment / Plan:     Visit Diagnoses: ANA positive - 1:160 nucleolar homogeneous pattern, ENA -  Fibromyalgia  Vitamin D deficiency  Bilateral leg edema  Hidradenitis  Allergic urticaria  Obsessive-compulsive disorder, unspecified type  History of asthma  Anxiety and depression  Essential hypertension    Orders: No orders of the defined types were placed in  this encounter.  No orders of the defined types were placed in this encounter.   Face-to-face time spent with patient was *** minutes. 50% of time was spent in counseling and coordination of care.  Follow-Up Instructions: No follow-ups on file.   Gearldine Bienenstockaylor M Lonette Stevison, PA-C  Note - This record has been created using Dragon software.  Chart creation errors have been sought, but may not always  have been located. Such creation errors do not reflect on  the standard of medical care.

## 2017-08-31 ENCOUNTER — Telehealth: Payer: Self-pay | Admitting: Rheumatology

## 2017-08-31 NOTE — Progress Notes (Deleted)
Office Visit Note  Patient: Whitney Harmon             Date of Birth: 02/27/1976           MRN: [id number]             PCP: No primary care provider on file. Referring: No ref. provider found Visit Date: 09/08/2017 Occupation: @GUAROCC @    Subjective:  No chief complaint on file.   History of Present Illness: Whitney Harmon is a 42 y.o. female ***   Activities of Daily Living:  Patient reports morning stiffness for *** {minute/hour:19697}.   Patient {ACTIONS;DENIES/REPORTS:21021675::"Denies"} nocturnal pain.  Difficulty dressing/grooming: {ACTIONS;DENIES/REPORTS:21021675::"Denies"} Difficulty climbing stairs: {ACTIONS;DENIES/REPORTS:21021675::"Denies"} Difficulty getting out of chair: {ACTIONS;DENIES/REPORTS:21021675::"Denies"} Difficulty using hands for taps, buttons, cutlery, and/or writing: {ACTIONS;DENIES/REPORTS:21021675::"Denies"}   No Rheumatology ROS completed.   PMFS History:  Patient Active Problem List   Diagnosis Date Noted  . Fibromyalgia 12/06/2016  . Vitamin D deficiency 12/06/2016  . History of asthma 12/06/2016  . Preventative health care 02/19/2015  . ANA positive 08/29/2014  . Bilateral leg edema 01/14/2014  . OCD (obsessive compulsive disorder) 12/26/2013  . Leg wound, left 03/23/2013  . Elevated antinuclear antibody (ANA) level 05/12/2011  . Hidradenitis 02/06/2011  . Acne 09/20/2010  . Weight gain 09/20/2010  . 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 Obesity 04/22/2010  . HYPERHIDROSIS 10/15/2009  . GENITAL HERPES 09/12/2009  . Anxiety and depression 06/18/2009  . Essential hypertension 06/18/2009    Past Medical History:  Diagnosis Date  . Asthma    childhood  . Elevated antinuclear antibody (ANA) level 05/12/2011  . Hypertension   . Overweight(278.02)     Family History  Problem Relation Age of Onset  . Arthritis Mother        osteoarthritis  . Diabetes Mother   . Hypertension Mother   . Mental illness Mother        schizophrenia   . Obesity Mother   . Schizophrenia Mother   . Hypertension Father   . Alcohol abuse Unknown   . Diabetes Unknown   . Hypertension Unknown    Past Surgical History:  Procedure Laterality Date  . LAPAROSCOPIC GASTRIC SLEEVE RESECTION  12/31/14  . 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   Physical Exam   Musculoskeletal Exam: ***  CDAI Exam: No CDAI exam completed.    Investigation: No additional findings. CBC Latest Ref Rng & Units 12/08/2016 02/19/2015 09/16/2012  WBC 3.8 - 10.8 K/uL 9.7 9.1 13.4(H)  Hemoglobin 11.7 - 15.5 g/dL 78.214.1 11.8(L) 12.9  Hematocrit 35.0 - 45.0 % 42.6 36.8 38.4  Platelets 140 - 400 K/uL 385 291.0 377   CMP Latest Ref Rng & Units 12/08/2016 12/25/2015 02/19/2015  Glucose 65 - 99 mg/dL 79 83 90  BUN 7 - 25 mg/dL 11 11 7   Creatinine 0.50 - 1.10 mg/dL 9.561.05 2.130.92 0.860.78  Sodium 135 - 146 mmol/L 141 136 140  Potassium 3.5 - 5.3 mmol/L 3.8 4.0 3.6  Chloride 98 - 110 mmol/L 104 103 104  CO2 20 - 31 mmol/L 28 26 27   Calcium 8.6 - 10.2 mg/dL 9.5 9.2 9.2  Total Protein 6.1 - 8.1 g/dL 7.1 - 6.9  Total Bilirubin 0.2 - 1.2 mg/dL 0.4 - 0.6  Alkaline Phos 33 - 115 U/L 55 - 69  AST 10 - 30 U/L 11 - 12  ALT 6 - 29 U/L 9 - 14    Imaging: No results found.  Speciality Comments: No specialty comments available.    Procedures:  No procedures performed Allergies: Fish oil; Peanut butter flavor; Peanuts [peanut oil]; Apple; and Vilazodone hcl   Assessment / Plan:     Visit Diagnoses: ANA positive - ANA 1:160 nucleolar homogeneous, ENA -hx of intermittent arthralgias, rash, and hair loss   Fibromyalgia  Vitamin D deficiency  Hidradenitis  Allergic urticaria  Essential hypertension  Anxiety and depression  History of asthma  History of OCD (obsessive compulsive disorder)    Orders: No orders of the defined  types were placed in this encounter.  No orders of the defined types were placed in this encounter.   Face-to-face time spent with patient was *** minutes. 50% of time was spent in counseling and coordination of care.  Follow-Up Instructions: No follow-ups on file.   Gearldine Bienenstock, PA-C  Note - This record has been created using Dragon software.  Chart creation errors have been sought, but may not always  have been located. Such creation errors do not reflect on  the standard of medical care.

## 2017-08-31 NOTE — Telephone Encounter (Signed)
FYI: patient rescheduled her appt on 4/9 due to having a cyst that she is being treated for right now. Per patient , she is due for labs at her next appt on 4/16, and wants to make sure being on antibiotics will no interfere with lab results. Please call to advise.

## 2017-09-01 ENCOUNTER — Ambulatory Visit: Payer: 59 | Admitting: Physician Assistant

## 2017-09-01 NOTE — Telephone Encounter (Signed)
It will be ok to get labs at follow up visit on 09/08/17.

## 2017-09-02 NOTE — Telephone Encounter (Signed)
Left message on machine to advise patient.

## 2017-09-08 ENCOUNTER — Ambulatory Visit: Payer: 59 | Admitting: Physician Assistant

## 2017-11-24 NOTE — Progress Notes (Signed)
Office Visit Note  Patient: Whitney Harmon             Date of Birth: 10-31-1975           MRN: [id number]             PCP: Patient, No Pcp Per Referring: [name], NP Visit Date: 12/08/2017 Occupation: @GUAROCC @    Subjective:  Muscle spasms   History of Present Illness: Whitney Harmon is a 42 y.o. female with history of positive ANA and fibromyalgia.  Patient reports she continues to have muscle aches and muscle tenderness in the upper extremities due to fibromyalgia.  She states that her fatigue is also been worsening.  She continues to have interrupted sleep at night.  She takes trazodone as needed and occasionally Xanax as needed for anxiety and insomnia.  She reports that she woke up this morning with mid paraspinal muscle spasms.  She denies any injury or overuse activities yesterday.  She states that she feels she slept in an odd position which led to the muscles tension and spasms.  She states she took ibuprofen this morning which provided minimal relief.  She denies any sores in her mouth or nose.  Denies any swollen lymph nodes.  Denies any recent rashes or photosensitivity.  Denies any sicca symptoms.  She states occasionally will have Raynaud's in her hands but no symptoms in her feet.  Denies any digital ulcerations.  Denies any palpitations or shortness of breath.     Activities of Daily Living:  Patient reports morning stiffness for 30 minutes.   Patient Denies nocturnal pain.  Difficulty dressing/grooming: Denies Difficulty climbing stairs: Denies Difficulty getting out of chair: Denies Difficulty using hands for taps, buttons, cutlery, and/or writing: Denies   Review of Systems  Constitutional: Positive for fatigue.  HENT: Negative for mouth sores, trouble swallowing, trouble swallowing, mouth dryness and nose dryness.   Eyes: Negative for pain, visual disturbance and dryness.  Respiratory: Negative for cough, hemoptysis, shortness of breath and  difficulty breathing.   Cardiovascular: Negative for chest pain, palpitations, hypertension and swelling in legs/feet.  Gastrointestinal: Positive for constipation. Negative for abdominal pain, blood in stool and diarrhea.  Endocrine: Negative for increased urination.  Genitourinary: Negative for painful urination and pelvic pain.  Musculoskeletal: Positive for arthralgias, joint pain and morning stiffness. Negative for joint swelling, myalgias, muscle weakness, muscle tenderness and myalgias.  Skin: Negative for color change, pallor, rash, hair loss, nodules/bumps, skin tightness, ulcers and sensitivity to sunlight.  Allergic/Immunologic: Negative for susceptible to infections.  Neurological: Negative for dizziness, light-headedness and numbness.  Hematological: Negative for swollen glands.  Psychiatric/Behavioral: Negative for depressed mood, confusion and sleep disturbance. The patient is not nervous/anxious.     PMFS History:  Patient Active Problem List   Diagnosis Date Noted  . Fibromyalgia 12/06/2016  . Vitamin D deficiency 12/06/2016  . History of asthma 12/06/2016  . Preventative health care 02/19/2015  . ANA positive 08/29/2014  . Bilateral leg edema 01/14/2014  . OCD (obsessive compulsive disorder) 12/26/2013  . Leg wound, left 03/23/2013  . Elevated antinuclear antibody (ANA) level 05/12/2011  . Hidradenitis 02/06/2011  . Acne 09/20/2010  . Weight gain 09/20/2010  . 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 Obesity 04/22/2010  . HYPERHIDROSIS 10/15/2009  . GENITAL HERPES 09/12/2009  . Anxiety and depression 06/18/2009  . Essential hypertension 06/18/2009    Past Medical History:  Diagnosis Date  . Asthma    childhood  . Elevated antinuclear antibody (  ANA) level 05/12/2011  . Hypertension   . Overweight(278.02)     Family History  Problem Relation Age of Onset  . Arthritis Mother        osteoarthritis  . Diabetes Mother   . Hypertension Mother   . Mental illness  Mother        schizophrenia  . Obesity Mother   . Schizophrenia Mother   . Hypertension Father   . Alcohol abuse Unknown   . Diabetes Unknown   . Hypertension Unknown   . Healthy Son   . Hypertension Sister    Past Surgical History:  Procedure Laterality Date  . LAPAROSCOPIC GASTRIC SLEEVE RESECTION  12/31/14  . 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 She appears well-developed and well-nourished.  HENT:  Head: Normocephalic and atraumatic.  Eyes: Conjunctivae and EOM are normal.  Neck: Normal range of motion.  Cardiovascular: Normal rate, regular rhythm, normal heart sounds and intact distal pulses.  Pulmonary/Chest: Effort normal and breath sounds normal.  Abdominal: Soft. Bowel sounds are normal.  Lymphadenopathy:    She has no cervical adenopathy.  Neurological: She is alert and oriented to person, place, and time.  Skin: Skin is warm and dry. Capillary refill takes less than 2 seconds.  Psychiatric: She has a normal mood and affect. Her behavior is normal.  Nursing note and vitals reviewed.    Musculoskeletal Exam: C-spine good ROM.  Tenderness of paraspinal muscles in thoracic region bilaterally.  No midline spinal tenderness.  No SI joint tenderness.  Shoulder joints, elbow joints, wrist joints, MCPs, PIPs, and DIPs good ROM with no synovitis.  Complete fist formation bilaterally.  Hip joints, knee joints, ankle joints, MCPs, PIPs, DIPs good range of motion with no synovitis.  No warmth or effusion of bilateral knee joints.  No tenderness of trochanteric bursa.  CDAI Exam: No CDAI exam completed.    Investigation: No  additional findings. CBC Latest Ref Rng & Units 12/08/2016 02/19/2015 09/16/2012  WBC 3.8 - 10.8 K/uL 9.7 9.1 13.4(H)  Hemoglobin 11.7 - 15.5 g/dL 16.1 11.8(L) 12.9  Hematocrit 35.0 - 45.0 % 42.6 36.8 38.4  Platelets 140 - 400 K/uL 385 291.0 377   CMP Latest Ref Rng & Units 12/08/2016 12/25/2015 02/19/2015  Glucose 65 - 99 mg/dL 79 83 90  BUN 7 - 25 mg/dL 11 11 7   Creatinine 0.50 - 1.10 mg/dL 0.96 0.45 4.09  Sodium 135 - 146 mmol/L 141 136 140  Potassium 3.5 - 5.3 mmol/L 3.8 4.0 3.6  Chloride 98 - 110 mmol/L 104 103 104  CO2 20 - 31 mmol/L 28 26 27   Calcium 8.6 - 10.2 mg/dL 9.5 9.2 9.2  Total Protein 6.1 - 8.1 g/dL 7.1 - 6.9  Total Bilirubin 0.2 - 1.2 mg/dL 0.4 - 0.6  Alkaline Phos 33 - 115 U/L 55 - 69  AST 10 - 30 U/L 11 - 12  ALT 6 - 29 U/L 9 - 14    Imaging: No results found.  Speciality Comments: No specialty comments available.    Procedures:  No procedures performed Allergies: Fish oil; Peanut butter flavor; Peanuts [peanut oil]; Apple; and Vilazodone hcl   Assessment / Plan:     Visit Diagnoses: ANA positive1:160 nuclear homogenous pattern.  ENA was negative: She has a history of intermittent arthralgias, rash, and hair loss.-She has no clinical signs or symptoms of autoimmune disease at this time.  She has no synovitis on exam.  She occasionally has discomfort in bilateral wrist joints but no joint swelling.  She has no oral or nasal ulcerations.  No cervical lymphadenopathy.  She denies any recent rashes or photosensitivity.  She gives a history of Raynaud's in her fingers but no digital ulcerations or signs of gangrene were noted.  She has not had any shortness of breath or palpitations.  She has not had any recent hair loss.  She denies any sicca symptoms.  Autoimmune labs will be checked today.  She is advised to notify us if she develops any new or worsening symptoms.  Plan: COMPLETE METABOLIC PANEL WITH GFR, C3 and C4, Anti-DNA antibody, double-stranded, Sedimentation rate,  ANA, Urinalysis, Routine w reflex microscopic, CBC with Differential/Platelet, VITAMIN D 25 Hydroxy (Vit-D Deficiency, Fractures)  Fibromyalgia: She continues to have generalized muscle aches and muscle tenderness due to fiber myalgia especially in the upper extremities.  She presents today with paraspinal muscle spasms in the thoracic region.  She was given a 7-day supply of Robaxin.  She will take Robaxin 500 mg 1 tablet by mouth as needed for muscle spasms.  We also discussed trying Biofreeze and heating pad.  She was given a handout of back exercises that she can start performing in 2 to 3 days.  She continues to have chronic worsening fatigue and insomnia.  She takes trazodone as needed for insomnia.  She will occasionally take Xanax as needed for anxiety and insomnia.  She was encouraged to continue to exercise on a regular basis.  She goes to [place] on a regular basis.  We discussed the importance of weight loss and healthy diet.  Vitamin D deficiency -She has not been taking a vitamin D supplement.  Vitamin D level rechecked today.  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   We discussed weight loss tactics.  She is going to start exercising on a more frequent basis.  She has been going to [facility] for exercise.   Orders: Orders Placed This Encounter  Procedures  . COMPLETE METABOLIC PANEL WITH GFR  . C3 and C4  . Anti-DNA antibody, double-stranded  . Sedimentation rate  . ANA  . Urinalysis, Routine w reflex microscopic  . CBC with Differential/Platelet  . VITAMIN D 25 Hydroxy (Vit-D Deficiency, Fractures)   Meds ordered this encounter  Medications  . methocarbamol (ROBAXIN) 500 MG tablet    Sig: Take 1 tablet (500 mg total) by mouth daily as  needed for muscle spasms.    Dispense:  7 tablet    Refill:  0    Face-to-face time spent with patient was 30 minutes. Greater than 50% of time was spent in counseling and coordination of care.  Follow-Up Instructions: Return in about 1 year (around 12/09/2018).   Gearldine Bienenstock, PA-C   I examined and evaluated the patient with Sherron Ales PA.  Patient had no synovitis on examination today.  She does have some discomfort in the paraspinal region of the thoracic and lumbar spine.  The plan of care was discussed as noted above.  Pollyann SavoyShaili Deveshwar, MD  Note - This record has been created using Animal nutritionistDragon software.  Chart creation errors have been sought, but may not always  have been located. Such creation errors do not reflect on  the standard of medical care.

## 2017-12-08 ENCOUNTER — Encounter: Payer: Self-pay | Admitting: Rheumatology

## 2017-12-08 ENCOUNTER — Ambulatory Visit (INDEPENDENT_AMBULATORY_CARE_PROVIDER_SITE_OTHER): Payer: 59 | Admitting: Rheumatology

## 2017-12-08 VITALS — BP 130/90 | HR 72 | Resp 15 | Ht 66.5 in | Wt 243.0 lb

## 2017-12-08 DIAGNOSIS — Z872 Personal history of diseases of the skin and subcutaneous tissue: Secondary | ICD-10-CM

## 2017-12-08 DIAGNOSIS — M797 Fibromyalgia: Secondary | ICD-10-CM | POA: Diagnosis not present

## 2017-12-08 DIAGNOSIS — E559 Vitamin D deficiency, unspecified: Secondary | ICD-10-CM | POA: Diagnosis not present

## 2017-12-08 DIAGNOSIS — Z8709 Personal history of other diseases of the respiratory system: Secondary | ICD-10-CM

## 2017-12-08 DIAGNOSIS — Z8679 Personal history of other diseases of the circulatory system: Secondary | ICD-10-CM

## 2017-12-08 DIAGNOSIS — Z8659 Personal history of other mental and behavioral disorders: Secondary | ICD-10-CM

## 2017-12-08 DIAGNOSIS — R768 Other specified abnormal immunological findings in serum: Secondary | ICD-10-CM

## 2017-12-08 DIAGNOSIS — R634 Abnormal weight loss: Secondary | ICD-10-CM

## 2017-12-08 MED ORDER — METHOCARBAMOL 500 MG PO TABS: 500.0000 mg | ORAL_TABLET | Freq: Every day | ORAL | 0 refills | Status: DC | PRN

## 2017-12-08 NOTE — Patient Instructions (Signed)

## 2017-12-10 LAB — COMPLETE METABOLIC PANEL WITH GFR
AG Ratio: 1.2 (calc) (ref 1.0–2.5)
ALKALINE PHOSPHATASE (APISO): 64 U/L (ref 33–115)
ALT: 8 U/L (ref 6–29)
AST: 10 U/L (ref 10–30)
Albumin: 3.7 g/dL (ref 3.6–5.1)
BUN: 9 mg/dL (ref 7–25)
CALCIUM: 8.9 mg/dL (ref 8.6–10.2)
CO2: 30 mmol/L (ref 20–32)
Chloride: 102 mmol/L (ref 98–110)
Creat: 0.92 mg/dL (ref 0.50–1.10)
GFR, Est African American: 91 mL/min/{1.73_m2} (ref >=60)
GFR, Est Non African American: 78 mL/min/{1.73_m2} (ref >=60)
Globulin: 3.1 g/dL (calc) (ref 1.9–3.7)
Glucose, Bld: 79 mg/dL (ref 65–99)
POTASSIUM: 4.4 mmol/L (ref 3.5–5.3)
SODIUM: 137 mmol/L (ref 135–146)
TOTAL PROTEIN: 6.8 g/dL (ref 6.1–8.1)
Total Bilirubin: 0.4 mg/dL (ref 0.2–1.2)

## 2017-12-10 LAB — CBC WITH DIFFERENTIAL/PLATELET
BASOS ABS: 50 {cells}/uL (ref 0–200)
Basophils Relative: 0.6 %
Eosinophils Absolute: 67 cells/uL (ref 15–500)
Eosinophils Relative: 0.8 %
HEMATOCRIT: 39.5 % (ref 35.0–45.0)
Hemoglobin: 12.8 g/dL (ref 11.7–15.5)
Lymphs Abs: 2251 cells/uL (ref 850–3900)
MCH: 24.4 pg — AB (ref 27.0–33.0)
MCHC: 32.4 g/dL (ref 32.0–36.0)
MCV: 75.2 fL — ABNORMAL LOW (ref 80.0–100.0)
MPV: 10.3 fL (ref 7.5–12.5)
Monocytes Relative: 5.9 %
NEUTROS PCT: 65.9 %
Neutro Abs: 5536 cells/uL (ref 1500–7800)
PLATELETS: 359 10*3/uL (ref 140–400)
RBC: 5.25 10*6/uL — AB (ref 3.80–5.10)
RDW: 12.8 % (ref 11.0–15.0)
TOTAL LYMPHOCYTE: 26.8 %
WBC mixed population: 496 cells/uL (ref 200–950)
WBC: 8.4 10*3/uL (ref 3.8–10.8)

## 2017-12-10 LAB — C3 AND C4
C3 COMPLEMENT: 135 mg/dL (ref 83–193)
C4 COMPLEMENT: 33 mg/dL (ref 15–57)

## 2017-12-10 LAB — SEDIMENTATION RATE: Sed Rate: 6 mm/h (ref 0–20)

## 2017-12-10 LAB — VITAMIN D 25 HYDROXY (VIT D DEFICIENCY, FRACTURES): Vit D, 25-Hydroxy: 30 ng/mL (ref 30–100)

## 2017-12-10 LAB — ANTI-DNA ANTIBODY, DOUBLE-STRANDED: ds DNA Ab: 1 IU/mL

## 2017-12-10 LAB — ANTI-NUCLEAR AB-TITER (ANA TITER)

## 2017-12-10 LAB — ANA: Anti Nuclear Antibody(ANA): POSITIVE — AB

## 2018-02-03 ENCOUNTER — Telehealth: Payer: Self-pay | Admitting: Rheumatology

## 2018-02-03 NOTE — Telephone Encounter (Signed)
Patient advised of lab results. Patient advised to take Vitamin D 2000 units daily to maintain vitamin D level. Patient verbalized understanding.

## 2018-02-03 NOTE — Telephone Encounter (Signed)
Patient left a voicemail requesting a return call with her labwork results.   °

## 2018-11-25 NOTE — Progress Notes (Deleted)
Office Visit Note  Patient: Whitney Harmon             Date of Birth: 05/05/1976           MRN: [id number]             PCP: Patient, No Pcp Per Referring: No ref. provider found Visit Date: 12/09/2018 Occupation: @GUAROCC @  Subjective:  No chief complaint on file.   History of Present Illness: Whitney Schwartzasha L Cuervo is a 43 y.o. female ***   Activities of Daily Living:  Patient reports morning stiffness for *** {minute/hour:19697}.   Patient {ACTIONS;DENIES/REPORTS:21021675::"Denies"} nocturnal pain.  Difficulty dressing/grooming: {ACTIONS;DENIES/REPORTS:21021675::"Denies"} Difficulty climbing stairs: {ACTIONS;DENIES/REPORTS:21021675::"Denies"} Difficulty getting out of chair: {ACTIONS;DENIES/REPORTS:21021675::"Denies"} Difficulty using hands for taps, buttons, cutlery, and/or writing: {ACTIONS;DENIES/REPORTS:21021675::"Denies"}  No Rheumatology ROS completed.   PMFS History:  Patient Active Problem List   Diagnosis Date Noted  . Fibromyalgia 12/06/2016  . Vitamin D deficiency 12/06/2016  . History of asthma 12/06/2016  . Preventative health care 02/19/2015  . ANA positive 08/29/2014  . Bilateral leg edema 01/14/2014  . OCD (obsessive compulsive disorder) 12/26/2013  . Leg wound, left 03/23/2013  . Elevated antinuclear antibody (ANA) level 05/12/2011  . Hidradenitis 02/06/2011  . Acne 09/20/2010  . Weight gain 09/20/2010  . 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 Obesity 04/22/2010  . HYPERHIDROSIS 10/15/2009  . GENITAL HERPES 09/12/2009  . Anxiety and depression 06/18/2009  . Essential hypertension 06/18/2009    Past Medical History:  Diagnosis Date  . Asthma    childhood  . Elevated antinuclear antibody (ANA) level 05/12/2011  . Hypertension   . Overweight(278.02)     Family History  Problem Relation Age of Onset  . Arthritis Mother        osteoarthritis  . Diabetes Mother   . Hypertension Mother   . Mental illness Mother        schizophrenia  . Obesity Mother    . Schizophrenia Mother   . Hypertension Father   . Alcohol abuse Unknown   . Diabetes Unknown   . Hypertension Unknown   . Healthy Son   . Hypertension Sister    Past Surgical History:  Procedure Laterality Date  . LAPAROSCOPIC GASTRIC SLEEVE RESECTION  12/31/14  . 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 PPD Test 04/05/2015  . Tdap 03/13/2013     Objective: Vital Signs: There were no vitals taken for this visit.   Physical Exam   Musculoskeletal Exam: ***  CDAI Exam: CDAI Score: - Patient Global: -; Provider Global: - Swollen: -; Tender: - Joint Exam   No joint exam has been documented for this visit   There is currently no information documented on the homunculus. Go to the Rheumatology activity and complete the homunculus joint exam.  Investigation: No additional findings.  Imaging: No results found.  Recent Labs: Lab Results  Component Value Date   WBC 8.4 12/08/2017   HGB 12.8 12/08/2017   PLT 359 12/08/2017   NA 137 12/08/2017   K 4.4 12/08/2017   CL 102 12/08/2017   CO2 30 12/08/2017   GLUCOSE 79 12/08/2017   BUN 9 12/08/2017   CREATININE 0.92 12/08/2017   BILITOT 0.4 12/08/2017   ALKPHOS 55 12/08/2016   AST 10 12/08/2017   ALT 8 12/08/2017   PROT 6.8 12/08/2017   ALBUMIN 4.0 12/08/2016  CALCIUM 8.9 12/08/2017   GFRAA 91 12/08/2017    Speciality Comments: No specialty comments available.  Procedures:  No procedures performed Allergies: Fish oil, Peanut butter flavor, Peanuts [peanut oil], Apple, and Vilazodone hcl   Assessment / Plan:     Visit Diagnoses: No diagnosis found.    Other medical conditions are listed as follows:  History of obsessive compulsive disorder  History of hypertension  History of hidradenitis suppurativa  History of asthma Orders: No orders of the defined types were  placed in this encounter.  No orders of the defined types were placed in this encounter.   Face-to-face time spent with patient was *** minutes. Greater than 50% of time was spent in counseling and coordination of care.  Follow-Up Instructions: No follow-ups on file.   Earnestine Mealing, CMA  Note - This record has been created using Editor, commissioning.  Chart creation errors have been sought, but may not always  have been located. Such creation errors do not reflect on  the standard of medical care.

## 2018-12-09 ENCOUNTER — Ambulatory Visit: Payer: Self-pay | Admitting: Rheumatology

## 2019-01-10 NOTE — Progress Notes (Deleted)
Office Visit Note  Patient: Whitney Harmon             Date of Birth: [birth year]           MRN: [id number]             PCP: Patient, No Pcp Per Referring: No ref. provider found Visit Date: 01/24/2019 Occupation: @GUAROCC @  Subjective:  No chief complaint on file.   History of Present Illness: Whitney Harmon is a 43 y.o. female ***   Activities of Daily Living:  Patient reports morning stiffness for *** {minute/hour:19697}.   Patient {ACTIONS;DENIES/REPORTS:21021675::"Denies"} nocturnal pain.  Difficulty dressing/grooming: {ACTIONS;DENIES/REPORTS:21021675::"Denies"} Difficulty climbing stairs: {ACTIONS;DENIES/REPORTS:21021675::"Denies"} Difficulty getting out of chair: {ACTIONS;DENIES/REPORTS:21021675::"Denies"} Difficulty using hands for taps, buttons, cutlery, and/or writing: {ACTIONS;DENIES/REPORTS:21021675::"Denies"}  No Rheumatology ROS completed.   PMFS History:  Patient Active Problem List   Diagnosis Date Noted  . Fibromyalgia 12/06/2016  . Vitamin D deficiency 12/06/2016  . History of asthma 12/06/2016  . Preventative health care 02/19/2015  . ANA positive 08/29/2014  . Bilateral leg edema 01/14/2014  . OCD (obsessive compulsive disorder) 12/26/2013  . Leg wound, left 03/23/2013  . Elevated antinuclear antibody (ANA) level 05/12/2011  . Hidradenitis 02/06/2011  . Acne 09/20/2010  . Weight gain 09/20/2010  . 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 Obesity 04/22/2010  . HYPERHIDROSIS 10/15/2009  . GENITAL HERPES 09/12/2009  . Anxiety and depression 06/18/2009  . Essential hypertension 06/18/2009    Past Medical History:  Diagnosis Date  . Asthma    childhood  . Elevated antinuclear antibody (ANA) level 05/12/2011  . Hypertension   . Overweight(278.02)     Family History  Problem Relation Age of Onset  . Arthritis Mother        osteoarthritis  . Diabetes Mother   . Hypertension Mother   . Mental illness Mother        schizophrenia  . Obesity Mother    . Schizophrenia Mother   . Hypertension Father   . Alcohol abuse Unknown   . Diabetes Unknown   . Hypertension Unknown   . Healthy Son   . Hypertension Sister    Past Surgical History:  Procedure Laterality Date  . LAPAROSCOPIC GASTRIC SLEEVE RESECTION  12/31/14  . 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 PPD Test 04/05/2015  . Tdap 03/13/2013     Objective: Vital Signs: There were no vitals taken for this visit.   Physical Exam   Musculoskeletal Exam: ***  CDAI Exam: CDAI Score: - Patient Global: -; Provider Global: - Swollen: -; Tender: - Joint Exam   No joint exam has been documented for this visit   There is currently no information documented on the homunculus. Go to the Rheumatology activity and complete the homunculus joint exam.  Investigation: No additional findings.  Imaging: No results found.  Recent Labs: Lab Results  Component Value Date   WBC 8.4 12/08/2017   HGB 12.8 12/08/2017   PLT 359 12/08/2017   NA 137 12/08/2017   K 4.4 12/08/2017   CL 102 12/08/2017   CO2 30 12/08/2017   GLUCOSE 79 12/08/2017   BUN 9 12/08/2017   CREATININE 0.92 12/08/2017   BILITOT 0.4 12/08/2017   ALKPHOS 55 12/08/2016   AST 10 12/08/2017   ALT 8 12/08/2017   PROT 6.8 12/08/2017   ALBUMIN 4.0 12/08/2016  CALCIUM 8.9 12/08/2017   GFRAA 91 12/08/2017    Speciality Comments: No specialty comments available.  Procedures:  No procedures performed Allergies: Fish oil, Peanut butter flavor, Peanuts [peanut oil], Apple, and Vilazodone hcl   Assessment / Plan:     Visit Diagnoses: No diagnosis found.  Orders: No orders of the defined types were placed in this encounter.  No orders of the defined types were placed in this encounter.   Face-to-face time spent with patient was *** minutes. Greater than 50% of time was spent in  counseling and coordination of care.  Follow-Up Instructions: No follow-ups on file.   Ellen HenriMarissa C Dyamond Tolosa, CMA  Note - This record has been created using Animal nutritionistDragon software.  Chart creation errors have been sought, but may not always  have been located. Such creation errors do not reflect on  the standard of medical care.

## 2019-01-24 ENCOUNTER — Ambulatory Visit: Payer: Self-pay | Admitting: Rheumatology

## 2019-04-29 ENCOUNTER — Telehealth: Payer: Self-pay | Admitting: Rheumatology

## 2019-04-29 NOTE — Telephone Encounter (Signed)
Patient called and request doctor's appointment. Patient phone number is [phone].

## 2019-05-02 NOTE — Telephone Encounter (Signed)
I left a message for patient with our new phone number. Also, advised patient doctor is not seeing patient in person right now. If patient would like to schedule a virtual appointment, we will be happy to do that.

## 2020-01-13 ENCOUNTER — Telehealth: Payer: Self-pay | Admitting: *Deleted

## 2020-01-13 NOTE — Telephone Encounter (Signed)
Patient contacted the office stating she needed a call back regarding some problems she is experiencing and her appointment she has scheduled.   Attempted to contact the patient and left message for patient to call the office.

## 2020-01-16 NOTE — Progress Notes (Signed)
Office Visit Note  Patient: Whitney Harmon             Date of Birth: 04-Jun-1975           MRN: [id number]             PCP: Patient, No Pcp Per Referring: No ref. provider found Visit Date: 01/23/2020 Occupation: @GUAROCC @  Subjective:  Fatigue   History of Present Illness: Whitney Harmon is a 44 y.o. female with history of positive ANA and fibromyalgia.  Patient was last seen on 12/08/2017.  She reports that since then she has been experiencing significant fatigue.  She has also noticed worsening brain fog.  She states that during the day she has difficulty with recall of certain conversations.  She states that she has to miss work on occasion due to the severity of her fatigue and overall myalgias and arthralgias.  She states that she has been having increased pain in both wrist joints and both ankle joints.  She denies any joint swelling at this time.  She states that she is also been experiencing discomfort due to trochanter bursitis bilaterally.  She states that it times the pain is severe and wakes her up at night.  She has been experiencing a lot of interruptions of sleep at night.  She states that overall she has been more sedentary recently but plans on trying to increase her exercise regimen.  She experiences intermittent hives but denies any recent facial rashes.  She has intermittent oral ulcerations but denies any nasal ulcerations.  She denies any sicca symptoms.  She experiences intermittent symptoms of Raynaud's.  She denies any enlarged lymph nodes.  She states that she was referred to a cardiologist due to experiencing episodic chest pain and palpitations.  She states that overall the work-up has been unremarkable.  She is not experiencing any chest pain, shortness of breath, or palpitations at this time.    Activities of Daily Living:  Patient reports morning stiffness for 30 minutes.   Patient Reports nocturnal pain.  Difficulty dressing/grooming: Reports Difficulty  climbing stairs: Reports Difficulty getting out of chair: Reports Difficulty using hands for taps, buttons, cutlery, and/or writing: Denies  Review of Systems  Constitutional: Positive for fatigue.  HENT: Negative for mouth sores, mouth dryness and nose dryness.   Eyes: Positive for dryness. Negative for pain and visual disturbance.  Respiratory: Negative for cough, hemoptysis, shortness of breath and difficulty breathing.   Cardiovascular: Negative for chest pain, palpitations, hypertension and swelling in legs/feet.  Gastrointestinal: Positive for constipation. Negative for blood in stool and diarrhea.  Endocrine: Negative for increased urination.  Genitourinary: Negative for difficulty urinating and painful urination.  Musculoskeletal: Positive for arthralgias, joint pain, myalgias, morning stiffness, muscle tenderness and myalgias. Negative for joint swelling and muscle weakness.  Skin: Positive for rash. Negative for color change, pallor, hair loss, nodules/bumps, skin tightness, ulcers and sensitivity to sunlight.  Allergic/Immunologic: Negative for susceptible to infections.  Neurological: Positive for dizziness and headaches.  Hematological: Negative for swollen glands.  Psychiatric/Behavioral: Negative for depressed mood, confusion and sleep disturbance. The patient is not nervous/anxious.     PMFS History:  Patient Active Problem List   Diagnosis Date Noted  . Fibromyalgia 12/06/2016  . Vitamin D deficiency 12/06/2016  . History of asthma 12/06/2016  . Preventative health care 02/19/2015  . ANA positive 08/29/2014  . Bilateral leg edema 01/14/2014  . OCD (obsessive compulsive disorder) 12/26/2013  . Leg wound, left 03/23/2013  .  Elevated antinuclear antibody (ANA) level 05/12/2011  . Hidradenitis 02/06/2011  . Acne 09/20/2010  . Weight gain 09/20/2010  . 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 Obesity 04/22/2010  . HYPERHIDROSIS 10/15/2009  . GENITAL HERPES 09/12/2009  .  Anxiety and depression 06/18/2009  . Essential hypertension 06/18/2009    Past Medical History:  Diagnosis Date  . Asthma    childhood  . Elevated antinuclear antibody (ANA) level 05/12/2011  . Hypertension   . Overweight(278.02)     Family History  Problem Relation Age of Onset  . Arthritis Mother        osteoarthritis  . Diabetes Mother   . Hypertension Mother   . Mental illness Mother        schizophrenia  . Obesity Mother   . Schizophrenia Mother   . Hypertension Father   . Alcohol abuse Other   . Diabetes Other   . Hypertension Other   . Healthy Son   . Hypertension Sister    Past Surgical History:  Procedure Laterality Date  . LAPAROSCOPIC GASTRIC SLEEVE RESECTION  12/31/14  . 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 PFIZER SARS-COV-2 Vaccination 11/21/2019, 12/19/2019  . PPD Test 04/05/2015  . Tdap 03/13/2013     Objective: Vital Signs: BP 117/82 (BP Location: Left Arm, Patient Position: Sitting, Cuff Size: Large)   Pulse 83   Resp 15   Ht 5' 7"  (1.702 m)   Wt 255 lb 12.8 oz (116 kg)   BMI 40.06 kg/m    Physical Exam Vitals and nursing note reviewed.  Constitutional:      Appearance: She is well-developed.  HENT:     Head: Normocephalic and atraumatic.  Eyes:     Conjunctiva/sclera: Conjunctivae normal.  Pulmonary:     Effort: Pulmonary effort is normal.  Abdominal:     Palpations: Abdomen is soft.  Musculoskeletal:     Cervical back: Normal range of motion.  Skin:    General: Skin is warm and dry.     Capillary Refill: Capillary refill takes less than 2 seconds.  Neurological:     Mental Status: She is alert and oriented to person, place, and time.  Psychiatric:        Behavior: Behavior normal.      Musculoskeletal Exam: C-spine, thoracic spine, lumbar spine have good range of motion.  Shoulder joints and  elbow joints have good range of motion with no discomfort.  Tenderness of both wrist joints.  MCPs, PIPs, and DIPs have good range of motion with no synovitis.  She has complete fist formation bilaterally.  Hip joints have good range of motion with no discomfort.  She has tenderness over bilateral trochanteric bursa.  Knee joints, ankle joints, MCPs, PIPs, and DIPs have good range of motion with no synovitis.  No warmth or effusion of knee joints noted.  She has tenderness of both ankle joints.  No tenderness of MTP joints.  CDAI Exam: CDAI Score: -- Patient Global: --; Provider Global: -- Swollen: 0 ; Tender: 4  Joint Exam 01/23/2020      Right  Left  Wrist   Tender   Tender  Ankle   Tender   Tender     Investigation: No additional findings.  Imaging: No results found.  Recent Labs: Lab Results  Component Value Date   WBC 8.4 12/08/2017  HGB 12.8 12/08/2017   PLT 359 12/08/2017   NA 137 12/08/2017   K 4.4 12/08/2017   CL 102 12/08/2017   CO2 30 12/08/2017   GLUCOSE 79 12/08/2017   BUN 9 12/08/2017   CREATININE 0.92 12/08/2017   BILITOT 0.4 12/08/2017   ALKPHOS 55 12/08/2016   AST 10 12/08/2017   ALT 8 12/08/2017   PROT 6.8 12/08/2017   ALBUMIN 4.0 12/08/2016   CALCIUM 8.9 12/08/2017   GFRAA 91 12/08/2017    Speciality Comments: No specialty comments available.  Procedures:  No procedures performed Allergies: Fish oil, Peanut butter flavor, Peanuts [peanut oil], Apple, and Vilazodone hcl   Assessment / Plan:     Visit Diagnoses: ANA positive - 1:640 homogeneous, dsDNA-, complements WNL, ESR WNL: She was last seen in the office 7//16/19. She has been experiencing significant fatigue, intermittent oral ulcerations, Raynaud's, worsening hair loss, intermittent rashes, myalgias, and arthralgias.  She has tenderness to palpation of both wrist joints and both ankle joints but no synovitis was noted.  She has not had any digital ulcerations or signs of gangrene.   Lab  work from 12/08/2017 was reviewed with the patient today in the office.  ANA was 1: 640 homogeneous and complements, ESR, and dsDNA were WNL.  We will obtain [facility] labs to complete the workup.  She was advised to notify us if she develops any new or worsening symptoms she will follow up in 6 weeks.   Fibromyalgia: She has generalized hyperalgesia and positive tender points on exam.  She has been experiencing more frequent and severe fibromyalgia flares.  She has had significant fatigue and excessive daytime drowsiness.  She states that at times she has to take several short naps.  Presents today with trochanter bursitis bilaterally.  She has tenderness palpation on exam.  She was given a handout of exercises to perform.  We discussed the importance of regular exercise and good sleep hygiene.  She was encouraged to start trying to exercise on a daily basis and we discussed the benefits of water aerobics.  Other medical conditions are listed as follows:  Vitamin D deficiency  History of hidradenitis suppurativa  History of obsessive compulsive disorder  History of hypertension  History of asthma  Orders: No orders of the defined types were placed in this encounter.  No orders of the defined types were placed in this encounter.   Face-to-face time spent with patient was 30 minutes. Greater than 50% of time was spent in counseling and coordination of care.  Follow-Up Instructions: Return in about 6 weeks (around 03/05/2020) for Fibromyalgia, +ANA.   Hazel Sams, PA-C  Note - This record has been created using Dragon software.  Chart creation errors have been sought, but may not always  have been located. Such creation errors do not reflect on  the standard of medical care.

## 2020-01-23 ENCOUNTER — Other Ambulatory Visit: Payer: Self-pay

## 2020-01-23 ENCOUNTER — Ambulatory Visit (INDEPENDENT_AMBULATORY_CARE_PROVIDER_SITE_OTHER): Payer: No Typology Code available for payment source | Admitting: Physician Assistant

## 2020-01-23 ENCOUNTER — Encounter: Payer: Self-pay | Admitting: Physician Assistant

## 2020-01-23 VITALS — BP 117/82 | HR 83 | Resp 15 | Ht 67.0 in | Wt 255.8 lb

## 2020-01-23 DIAGNOSIS — Z872 Personal history of diseases of the skin and subcutaneous tissue: Secondary | ICD-10-CM

## 2020-01-23 DIAGNOSIS — E559 Vitamin D deficiency, unspecified: Secondary | ICD-10-CM | POA: Diagnosis not present

## 2020-01-23 DIAGNOSIS — R768 Other specified abnormal immunological findings in serum: Secondary | ICD-10-CM

## 2020-01-23 DIAGNOSIS — Z8679 Personal history of other diseases of the circulatory system: Secondary | ICD-10-CM

## 2020-01-23 DIAGNOSIS — Z8659 Personal history of other mental and behavioral disorders: Secondary | ICD-10-CM

## 2020-01-23 DIAGNOSIS — Z8709 Personal history of other diseases of the respiratory system: Secondary | ICD-10-CM

## 2020-01-23 DIAGNOSIS — M797 Fibromyalgia: Secondary | ICD-10-CM | POA: Diagnosis not present

## 2020-01-23 NOTE — Patient Instructions (Signed)
Iliotibial Band Syndrome Rehab Ask your health care provider which exercises are safe for you. Do exercises exactly as told by your health care provider and adjust them as directed. It is normal to feel mild stretching, pulling, tightness, or discomfort as you do these exercises. Stop right away if you feel sudden pain or your pain gets significantly worse. Do not begin these exercises until told by your health care provider. Stretching and range-of-motion exercises These exercises warm up your muscles and joints and improve the movement and flexibility of your hip and pelvis. Quadriceps stretch, prone  1. Lie on your abdomen on a firm surface, such as a bed or padded floor (prone position). 2. Bend your left / right knee and reach back to hold your ankle or pant leg. If you cannot reach your ankle or pant leg, loop a belt around your foot and grab the belt instead. 3. Gently pull your heel toward your buttocks. Your knee should not slide out to the side. You should feel a stretch in the front of your thigh and knee (quadriceps). 4. Hold this position for __________ seconds. Repeat __________ times. Complete this exercise __________ times a day. Iliotibial band stretch An iliotibial band is a strong band of muscle tissue that runs from the outer side of your hip to the outer side of your thigh and knee. 1. Lie on your side with your left / right leg in the top position. 2. Bend both of your knees and grab your left / right ankle. Stretch out your bottom arm to help you balance. 3. Slowly bring your top knee back so your thigh goes behind your trunk. 4. Slowly lower your top leg toward the floor until you feel a gentle stretch on the outside of your left / right hip and thigh. If you do not feel a stretch and your knee will not fall farther, place the heel of your other foot on top of your knee and pull your knee down toward the floor with your foot. 5. Hold this position for __________  seconds. Repeat __________ times. Complete this exercise __________ times a day. Strengthening exercises These exercises build strength and endurance in your hip and pelvis. Endurance is the ability to use your muscles for a long time, even after they get tired. Straight leg raises, side-lying This exercise strengthens the muscles that rotate the leg at the hip and move it away from your body (hip abductors). 1. Lie on your side with your left / right leg in the top position. Lie so your head, shoulder, hip, and knee line up. You may bend your bottom knee to help you balance. 2. Roll your hips slightly forward so your hips are stacked directly over each other and your left / right knee is facing forward. 3. Tense the muscles in your outer thigh and lift your top leg 4-6 inches (10-15 cm). 4. Hold this position for __________ seconds. 5. Slowly return to the starting position. Let your muscles relax completely before doing another repetition. Repeat __________ times. Complete this exercise __________ times a day. Leg raises, prone This exercise strengthens the muscles that move the hips (hip extensors). 1. Lie on your abdomen on your bed or a firm surface. You can put a pillow under your hips if that is more comfortable for your lower back. 2. Bend your left / right knee so your foot is straight up in the air. 3. Squeeze your buttocks muscles and lift your left / right thigh   off the bed. Do not let your back arch. 4. Tense your thigh muscle as hard as you can without increasing any knee pain. 5. Hold this position for __________ seconds. 6. Slowly lower your leg to the starting position and allow it to relax completely. Repeat __________ times. Complete this exercise __________ times a day. Hip hike 1. Stand sideways on a bottom step. Stand on your left / right leg with your other foot unsupported next to the step. You can hold on to the railing or wall for balance if needed. 2. Keep your knees  straight and your torso square. Then lift your left / right hip up toward the ceiling. 3. Slowly let your left / right hip lower toward the floor, past the starting position. Your foot should get closer to the floor. Do not lean or bend your knees. Repeat __________ times. Complete this exercise __________ times a day. This information is not intended to replace advice given to you by your health care provider. Make sure you discuss any questions you have with your health care provider. Document Revised: 09/02/2018 Document Reviewed: 03/03/2018 Elsevier Patient Education  2020 [employer]   Hip Bursitis Rehab Ask your health care provider which exercises are safe for you. Do exercises exactly as told by your health care provider and adjust them as directed. It is normal to feel mild stretching, pulling, tightness, or discomfort as you do these exercises. Stop right away if you feel sudden pain or your pain gets worse. Do not begin these exercises until told by your health care provider. Stretching exercise This exercise warms up your muscles and joints and improves the movement and flexibility of your hip. This exercise also helps to relieve pain and stiffness. Iliotibial band stretch An iliotibial band is a strong band of muscle tissue that runs from the outer side of your hip to the outer side of your thigh and knee. 1. Lie on your side with your left / right leg in the top position. 2. Bend your left / right knee and grab your ankle. Stretch out your bottom arm to help you balance. 3. Slowly bring your knee back so your thigh is behind your body. 4. Slowly lower your knee toward the floor until you feel a gentle stretch on the outside of your left / right thigh. If you do not feel a stretch and your knee will not fall farther, place the heel of your other foot on top of your knee and pull your knee down toward the floor with your foot. 5. Hold this position for __________ seconds. 6. Slowly  return to the starting position. Repeat __________ times. Complete this exercise __________ times a day. Strengthening exercises These exercises build strength and endurance in your hip and pelvis. Endurance is the ability to use your muscles for a long time, even after they get tired. Bridge This exercise strengthens the muscles that move your thigh backward (hip extensors). 1. Lie on your back on a firm surface with your knees bent and your feet flat on the floor. 2. Tighten your buttocks muscles and lift your buttocks off the floor until your trunk is level with your thighs. ? Do not arch your back. ? You should feel the muscles working in your buttocks and the back of your thighs. If you do not feel these muscles, slide your feet 1-2 inches (2.5-5 cm) farther away from your buttocks. ? If this exercise is too easy, try doing it with your arms crossed   over your chest. 3. Hold this position for __________ seconds. 4. Slowly lower your hips to the starting position. 5. Let your muscles relax completely after each repetition. Repeat __________ times. Complete this exercise __________ times a day. Squats This exercise strengthens the muscles in front of your thigh and knee (quadriceps). 1. Stand in front of a table, with your feet and knees pointing straight ahead. You may rest your hands on the table for balance but not for support. 2. Slowly bend your knees and lower your hips like you are going to sit in a chair. ? Keep your weight over your heels, not over your toes. ? Keep your lower legs upright so they are parallel with the table legs. ? Do not let your hips go lower than your knees. ? Do not bend lower than told by your health care provider. ? If your hip pain increases, do not bend as low. 3. Hold the squat position for __________ seconds. 4. Slowly push with your legs to return to standing. Do not use your hands to pull yourself to standing. Repeat __________ times. Complete this  exercise __________ times a day. Hip hike 1. Stand sideways on a bottom step. Stand on your left / right leg with your other foot unsupported next to the step. You can hold on to the railing or wall for balance if needed. 2. Keep your knees straight and your torso square. Then lift your left / right hip up toward the ceiling. 3. Hold this position for __________ seconds. 4. Slowly let your left / right hip lower toward the floor, past the starting position. Your foot should get closer to the floor. Do not lean or bend your knees. Repeat __________ times. Complete this exercise __________ times a day. Single leg stand 1. Without shoes, stand near a railing or in a doorway. You may hold on to the railing or door frame as needed for balance. 2. Squeeze your left / right buttock muscles, then lift up your other foot. ? Do not let your left / right hip push out to the side. ? It is helpful to stand in front of a mirror for this exercise so you can watch your hip. 3. Hold this position for __________ seconds. Repeat __________ times. Complete this exercise __________ times a day. This information is not intended to replace advice given to you by your health care provider. Make sure you discuss any questions you have with your health care provider. Document Revised: 09/06/2018 Document Reviewed: 09/06/2018 Elsevier Patient Education  2020 [employer] Inc[employer]  

## 2020-02-03 ENCOUNTER — Telehealth: Payer: Self-pay | Admitting: Rheumatology

## 2020-02-03 NOTE — Telephone Encounter (Signed)
Attempted to contact the patient and left message to advise patient she may come by the office to pick up [facility] lab form as it is ready for pick up.

## 2020-02-03 NOTE — Telephone Encounter (Signed)
Patient calling to get another copy of [facility] lab orders. She has misplaced original paper work. Please call when ready to pick up.

## 2020-02-07 ENCOUNTER — Encounter: Payer: Self-pay | Admitting: Rheumatology

## 2020-02-16 ENCOUNTER — Ambulatory Visit: Payer: No Typology Code available for payment source | Admitting: Allergy & Immunology

## 2020-02-20 NOTE — Progress Notes (Deleted)
Office Visit Note  Patient: Whitney Harmon             Date of Birth: 06/09/1975           MRN: [id number]             PCP: Patient, No Pcp Per Referring: No ref. provider found Visit Date: 03/05/2020 Occupation: @GUAROCC @  Subjective:  No chief complaint on file.   History of Present Illness: Whitney Harmon is a 44 y.o. female ***   Activities of Daily Living:  Patient reports morning stiffness for *** {minute/hour:19697}.   Patient {ACTIONS;DENIES/REPORTS:21021675::"Denies"} nocturnal pain.  Difficulty dressing/grooming: {ACTIONS;DENIES/REPORTS:21021675::"Denies"} Difficulty climbing stairs: {ACTIONS;DENIES/REPORTS:21021675::"Denies"} Difficulty getting out of chair: {ACTIONS;DENIES/REPORTS:21021675::"Denies"} Difficulty using hands for taps, buttons, cutlery, and/or writing: {ACTIONS;DENIES/REPORTS:21021675::"Denies"}  No Rheumatology ROS completed.   PMFS History:  Patient Active Problem List   Diagnosis Date Noted  . Fibromyalgia 12/06/2016  . Vitamin D deficiency 12/06/2016  . History of asthma 12/06/2016  . Preventative health care 02/19/2015  . ANA positive 08/29/2014  . Bilateral leg edema 01/14/2014  . OCD (obsessive compulsive disorder) 12/26/2013  . Leg wound, left 03/23/2013  . Elevated antinuclear antibody (ANA) level 05/12/2011  . Hidradenitis 02/06/2011  . Acne 09/20/2010  . Weight gain 09/20/2010  . 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 Obesity 04/22/2010  . HYPERHIDROSIS 10/15/2009  . GENITAL HERPES 09/12/2009  . Anxiety and depression 06/18/2009  . Essential hypertension 06/18/2009    Past Medical History:  Diagnosis Date  . Asthma    childhood  . Elevated antinuclear antibody (ANA) level 05/12/2011  . Hypertension   . Overweight(278.02)     Family History  Problem Relation Age of Onset  . Arthritis Mother        osteoarthritis  . Diabetes Mother   . Hypertension Mother   . Mental illness Mother        schizophrenia  . Obesity Mother    . Schizophrenia Mother   . Hypertension Father   . Alcohol abuse Other   . Diabetes Other   . Hypertension Other   . Healthy Son   . Hypertension Sister    Past Surgical History:  Procedure Laterality Date  . LAPAROSCOPIC GASTRIC SLEEVE RESECTION  12/31/14  . 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 PFIZER SARS-COV-2 Vaccination 11/21/2019, 12/19/2019  . PPD Test 04/05/2015  . Tdap 03/13/2013     Objective: Vital Signs: There were no vitals taken for this visit.   Physical Exam   Musculoskeletal Exam: ***  CDAI Exam: CDAI Score: -- Patient Global: --; Provider Global: -- Swollen: --; Tender: -- Joint Exam 03/05/2020   No joint exam has been documented for this visit   There is currently no information documented on the homunculus. Go to the Rheumatology activity and complete the homunculus joint exam.  Investigation: No additional findings.  Imaging: No results found.  Recent Labs: Lab Results  Component Value Date   WBC 8.4 12/08/2017   HGB 12.8 12/08/2017   PLT 359 12/08/2017   NA 137 12/08/2017   K 4.4 12/08/2017   CL 102 12/08/2017   CO2 30 12/08/2017   GLUCOSE 79 12/08/2017   BUN 9 12/08/2017   CREATININE 0.92 12/08/2017   BILITOT 0.4 12/08/2017   ALKPHOS 55 12/08/2016   AST 10 12/08/2017   ALT 8 12/08/2017   PROT  6.8 12/08/2017   ALBUMIN 4.0 12/08/2016   CALCIUM 8.9 12/08/2017   GFRAA 91 12/08/2017    Speciality Comments: No specialty comments available.  Procedures:  No procedures performed Allergies: Fish oil, Peanut butter flavor, Peanuts [peanut oil], Apple, and Vilazodone hcl   Assessment / Plan:     Visit Diagnoses: No diagnosis found.  Orders: No orders of the defined types were placed in this encounter.  No orders of the defined types were placed in this encounter.   Face-to-face time spent  with patient was *** minutes. Greater than 50% of time was spent in counseling and coordination of care.  Follow-Up Instructions: No follow-ups on file.   Ellen Henri, CMA  Note - This record has been created using Animal nutritionist.  Chart creation errors have been sought, but may not always  have been located. Such creation errors do not reflect on  the standard of medical care.

## 2020-02-21 ENCOUNTER — Ambulatory Visit: Payer: Self-pay | Admitting: Rheumatology

## 2020-03-05 ENCOUNTER — Telehealth: Payer: Self-pay | Admitting: Rheumatology

## 2020-03-05 ENCOUNTER — Ambulatory Visit: Payer: No Typology Code available for payment source | Admitting: Physician Assistant

## 2020-03-05 NOTE — Telephone Encounter (Signed)
Patient found out she has been exposed to her COVID positive partner. Patient is having symptoms, and canceled appointment for today. Patient was to come in for lab results, and would like to know if she could do a virtual appointment instead of waiting the 4 weeks to reschedule? Or, get results over the phone?  Please call to advise.

## 2020-03-05 NOTE — Telephone Encounter (Signed)
Ok to schedule virtual visit to discuss [facility] lab work

## 2020-03-05 NOTE — Telephone Encounter (Signed)
I LMOM for patient to call, and schedule a virtual appointment to discuss [facility] lab results.

## 2020-03-08 ENCOUNTER — Other Ambulatory Visit: Payer: Self-pay

## 2020-03-08 ENCOUNTER — Encounter: Payer: Self-pay | Admitting: Rheumatology

## 2020-03-08 ENCOUNTER — Telehealth (INDEPENDENT_AMBULATORY_CARE_PROVIDER_SITE_OTHER): Payer: No Typology Code available for payment source | Admitting: Rheumatology

## 2020-03-08 VITALS — Ht 67.0 in

## 2020-03-08 DIAGNOSIS — R768 Other specified abnormal immunological findings in serum: Secondary | ICD-10-CM

## 2020-03-08 DIAGNOSIS — E559 Vitamin D deficiency, unspecified: Secondary | ICD-10-CM

## 2020-03-08 DIAGNOSIS — Z872 Personal history of diseases of the skin and subcutaneous tissue: Secondary | ICD-10-CM

## 2020-03-08 DIAGNOSIS — Z8709 Personal history of other diseases of the respiratory system: Secondary | ICD-10-CM

## 2020-03-08 DIAGNOSIS — M797 Fibromyalgia: Secondary | ICD-10-CM

## 2020-03-08 DIAGNOSIS — Z8659 Personal history of other mental and behavioral disorders: Secondary | ICD-10-CM

## 2020-03-08 DIAGNOSIS — Z8679 Personal history of other diseases of the circulatory system: Secondary | ICD-10-CM

## 2020-03-08 NOTE — Progress Notes (Signed)
Virtual Visit via Video Note  I connected with Whitney Harmon on 03/08/20 at  4:00 PM EDT by a video enabled telemedicine application and verified that I am speaking with the correct person using two identifiers.  Location: Patient: Home  Provider: Clinic  This service was conducted via virtual visit.  Both audio and visual tools were used.  The patient was located at home. I was located in my office.  Consent was obtained prior to the virtual visit and is aware of possible charges through their insurance for this visit.  The patient is an established patient.  Dr. Estanislado Pandy, MD conducted the virtual visit and Hazel Sams, PA-C acted as scribe during the service.  Office staff helped with scheduling follow up visits after the service was conducted.   I discussed the limitations of evaluation and management by telemedicine and the availability of in person appointments. The patient expressed understanding and agreed to proceed.  CC: Discuss [facility] lab work  History of Present Illness: Patient is a 44 year old female with a past medical history of positive ANA and fibromyalgia. She has been having increased generalized pain and fatigue over the past several months. She has been experiencing increased pain in both knee joints and both ankles. She has started to perform stretching exercises on a daily basis, which alleviate some of her morning stiffness.  She is concerned about a diagnosis of lupus which she was given several years ago, so she would like to discuss [facility] lab work today.  She continues to have recurrent oral ulcerations and chronic sicca symptoms.   She has intermittent rashes and photosensitivity.  She has established care with an allergist.   She has noticed increased short term memory loss, which has been interfering with her work.  She continues to have difficulty sleeping at night.  Review of Systems  Constitutional: Positive for malaise/fatigue. Negative for fever.  HENT:        +oral ulcerations +dry mouth  Eyes: Negative for photophobia, pain, discharge and redness.       +dry eyes  Respiratory: Positive for cough. Negative for shortness of breath and wheezing.   Cardiovascular: Negative for chest pain and palpitations.  Gastrointestinal: Negative for blood in stool, constipation and diarrhea.  Genitourinary: Negative for dysuria.  Musculoskeletal: Positive for joint pain and myalgias. Negative for back pain and neck pain.       +Morning stiffness   Skin: Positive for rash.       +photosensitivity   Neurological: Negative for dizziness and headaches.  Psychiatric/Behavioral: Positive for memory loss. Negative for depression. The patient has insomnia. The patient is not nervous/anxious.       Observations/Objective: Physical Exam HENT:     Head: Normocephalic and atraumatic.  Eyes:     Conjunctiva/sclera: Conjunctivae normal.  Pulmonary:     Effort: Pulmonary effort is normal.  Neurological:     Mental Status: She is alert and oriented to person, place, and time.  Psychiatric:        Mood and Affect: Mood and affect normal.        Cognition and Memory: Memory normal.        Judgment: Judgment normal.    Patient reports morning stiffness for 20  minutes.   Patient reports nocturnal pain.  Difficulty dressing/grooming: Denies Difficulty climbing stairs: Reports Difficulty getting out of chair: Reports Difficulty using hands for taps, buttons, cutlery, and/or writing: Denies   Assessment and Plan: Visit Diagnoses: ANA positive - 1:640  homogeneous, dsDNA-, complements WNL, ESR WNL, [facility] positive 1.1-ANA 1:640 Homogeneous, rheumatoid factor IgM equivocal, +Antiphosphatidylserine/prothrombin IgM: [facility] lab work was reviewed with the patient today in detail and all questions were addressed. She has been experiencing significant fatigue, intermittent oral ulcerations, Raynaud's, worsening hair loss, memory loss, intermittent rashes, photosensitivity,  myalgias, and arthralgias. She has been experiencing severe pain and stiffness in both knee joints and both ankle joints. We discussed a trial of plaquenil for 3 months to see her response.  Indications, contraindications, and potential side effects of PLQ were discussed today.  She will return to the office in 1 month to further discuss and obtain consent prior to sending in a prescription for PLQ.  We will be filling out FMLA paperwork for the patient to miss 1-2 days a month for flares.   Fibromyalgia: She has ongoing generalized myalgias and muscle tenderness due to fibromyalgia. She has ongoing fatigue secondary to insomnia.  She has noticed worsening brain fog recently. We discussed the importance of regular exercise and good sleep hygiene.      Other medical conditions are listed as follows:  Vitamin D deficiency  History of hidradenitis suppurativa  History of obsessive compulsive disorder  History of hypertension  History of asthma  Follow Up Instructions: She will follow up in 1 month.   I discussed the assessment and treatment plan with the patient. The patient was provided an opportunity to ask questions and all were answered. The patient agreed with the plan and demonstrated an understanding of the instructions.   The patient was advised to call back or seek an in-person evaluation if the symptoms worsen or if the condition fails to improve as anticipated.  I provided 25 minutes of non-face-to-face time during this encounter.   Hazel Sams, PA-C acted as Education administrator.  I reviewed above note and attest to the accuracy of the scribed documentation. Bo Merino, MD

## 2020-03-14 ENCOUNTER — Telehealth: Payer: Self-pay | Admitting: Rheumatology

## 2020-03-14 NOTE — Telephone Encounter (Signed)
Attempted to contact the patient and left message for patient to call the office.  Per office note on 03/08/2020: She will return to the office in 1 month to further discuss and obtain consent prior to sending in a prescription for PLQ.

## 2020-03-14 NOTE — Telephone Encounter (Signed)
Patient calling to see when she is suppose to come back for an appointment? Also, is patient to come back 2 weeks from virtual visit to just sign medication release forms, or have an appointment then too? Please call patient to advise.

## 2020-03-15 ENCOUNTER — Telehealth: Payer: No Typology Code available for payment source | Admitting: Rheumatology

## 2020-03-27 ENCOUNTER — Telehealth: Payer: Self-pay | Admitting: *Deleted

## 2020-03-27 NOTE — Telephone Encounter (Signed)
Received FMLA paperwork for patient. Do no see where it has been discussed with Dr. Corliss Skains in her chart and do not see that we have filled this out for her before. Also did not receive the entire set of paperwork.  Attempted to contact patient and left message for patient to call the office.

## 2020-03-27 NOTE — Progress Notes (Signed)
Office Visit Note  Patient: Whitney Harmon             Date of Birth: 12-Jan-1976           MRN: [id number]             PCP: Bryon Lions, PA-C Referring: No ref. provider found Visit Date: 04/10/2020 Occupation: @GUAROCC @  Subjective:  Discuss starting PLQ   History of Present Illness: Whitney Harmon is a 44 y.o. female with history of positive ANA and fibromyalgia.  Patient presents today to discuss starting on Plaquenil.  She continues to have myalgias, arthralgias, joint stiffness, fatigue, oral ulcerations, and sicca symptoms.  She denies any rashes or photosensitivity.  She has been experiencing pain and stiffness in both wrist joints, both knees, and both ankle joints.  She denies any joint swelling.  She denies any symptoms of Raynaud's or digital ulcerations.  Patient reports that she experiences intermittent chest pain and has undergone a thorough work-up by her PCP and cardiologist.  She denies any palpitations or shortness of breath at this time.  She states that these episodes have happened about once a month.  She denies any alleviating or aggravating factors.    Activities of Daily Living:  Patient reports morning stiffness for 15-20 minutes.   Patient Reports nocturnal pain.  Difficulty dressing/grooming: Reports Difficulty climbing stairs: Denies Difficulty getting out of chair: Reports Difficulty using hands for taps, buttons, cutlery, and/or writing: Denies  Review of Systems  Constitutional: Positive for fatigue.  HENT: Positive for mouth dryness and nose dryness. Negative for mouth sores.   Eyes: Positive for itching and dryness. Negative for pain and visual disturbance.  Respiratory: Positive for cough. Negative for hemoptysis, shortness of breath and difficulty breathing.   Cardiovascular: Positive for chest pain (Episodic. Workup by cardiologist unremarkable). Negative for palpitations, hypertension and swelling in legs/feet.  Gastrointestinal: Positive  for constipation. Negative for abdominal pain, blood in stool and diarrhea.  Endocrine: Negative for increased urination.  Genitourinary: Negative for painful urination.  Musculoskeletal: Positive for arthralgias, joint pain, myalgias, muscle weakness, morning stiffness, muscle tenderness and myalgias. Negative for joint swelling.  Skin: Positive for redness. Negative for color change, pallor, rash, hair loss, nodules/bumps, skin tightness, ulcers and sensitivity to sunlight.  Neurological: Positive for weakness. Negative for numbness.  Hematological: Negative for swollen glands.  Psychiatric/Behavioral: Positive for confusion and sleep disturbance. Negative for depressed mood. The patient is not nervous/anxious.     PMFS History:  Patient Active Problem List   Diagnosis Date Noted  . Fibromyalgia 12/06/2016  . Vitamin D deficiency 12/06/2016  . History of asthma 12/06/2016  . Preventative health care 02/19/2015  . ANA positive 08/29/2014  . Bilateral leg edema 01/14/2014  . OCD (obsessive compulsive disorder) 12/26/2013  . Leg wound, left 03/23/2013  . Elevated antinuclear antibody (ANA) level 05/12/2011  . Hidradenitis 02/06/2011  . Acne 09/20/2010  . Weight gain 09/20/2010  . 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 Obesity 04/22/2010  . HYPERHIDROSIS 10/15/2009  . GENITAL HERPES 09/12/2009  . Anxiety and depression 06/18/2009  . Essential hypertension 06/18/2009    Past Medical History:  Diagnosis Date  . Asthma    childhood  . Elevated antinuclear antibody (ANA) level 05/12/2011  . Hypertension   . Overweight(278.02)     Family History  Problem Relation Age of Onset  . Arthritis Mother        osteoarthritis  . Diabetes Mother   . Hypertension Mother   .  Mental illness Mother        schizophrenia  . Obesity Mother   . Schizophrenia Mother   . Hypertension Father   . Alcohol abuse Other   . Diabetes Other   . Hypertension Other   . Healthy Son   . Hypertension Sister     Past Surgical History:  Procedure Laterality Date  . LAPAROSCOPIC GASTRIC SLEEVE RESECTION  12/31/14  . 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 PFIZER SARS-COV-2 Vaccination 11/21/2019, 12/19/2019  . PPD Test 04/05/2015  . Tdap 03/13/2013     Objective: Vital Signs: BP (!) 136/91 (BP Location: Left Arm, Patient Position: Sitting, Cuff Size: Small)   Pulse 79   Ht 5' 6.5" (1.689 m)   Wt 255 lb (115.7 kg)   BMI 40.54 kg/m    Physical Exam Vitals and nursing note reviewed.  Constitutional:      Appearance: She is well-developed.  HENT:     Head: Normocephalic and atraumatic.  Eyes:     Conjunctiva/sclera: Conjunctivae normal.  Cardiovascular:     Rate and Rhythm: Normal rate.  Abdominal:     Palpations: Abdomen is soft.  Musculoskeletal:     Cervical back: Normal range of motion.  Skin:    General: Skin is warm and dry.     Capillary Refill: Capillary refill takes less than 2 seconds.  Neurological:     Mental Status: She is alert and oriented to person, place, and time.  Psychiatric:        Behavior: Behavior normal.      Musculoskeletal Exam: Generalized hyperalgesia and positive tender points.  C-spine, thoracic spine, and lumbar spine good ROM.  No midline spinal tenderness.  No SI joint tenderness.  Shoulder joints, elbow joints, wrist joints, MCPs, PIPs, and DIPs good ROM with no synovitis.  Complete fist formation bilaterally.  Hip joints good ROM with discomfort bilaterally.  Tenderness to palpation over bilateral trochanteric bursa.  Knee joints good ROM with no warmth or effusion.  Ankle joints good ROM with no tenderness or swelling.    CDAI Exam: CDAI Score: -- Patient Global: --; Provider Global: -- Swollen: --; Tender: -- Joint Exam 04/10/2020   No joint exam has been documented for this visit   There is currently no  information documented on the homunculus. Go to the Rheumatology activity and complete the homunculus joint exam.  Investigation: No additional findings.  Imaging: No results found.  Recent Labs: Lab Results  Component Value Date   WBC 8.4 12/08/2017   HGB 12.8 12/08/2017   PLT 359 12/08/2017   NA 137 12/08/2017   K 4.4 12/08/2017   CL 102 12/08/2017   CO2 30 12/08/2017   GLUCOSE 79 12/08/2017   BUN 9 12/08/2017   CREATININE 0.92 12/08/2017   BILITOT 0.4 12/08/2017   ALKPHOS 55 12/08/2016   AST 10 12/08/2017   ALT 8 12/08/2017   PROT 6.8 12/08/2017   ALBUMIN 4.0 12/08/2016   CALCIUM 8.9 12/08/2017   GFRAA 91 12/08/2017    Speciality Comments: No specialty comments available.  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 AVISE lab work was discussed in detail with the patient over virtual visit on 03/08/20. We discussed a trial of plaquenil 200 mg 1 tablet by mouth twice daily.  Indications, contraindications, potential side effects of Plaquenil were discussed today in detail.  All questions were addressed and consent was obtained today.  She was advised to schedule a baseline Plaquenil eye exam ASAP.  We will check baseline CBC and CMP today and will repeat these labs in 1 month, 3 months, then every 5 months.  Standing orders for CBC and CMP were placed today.  She was advised to notify us if she cannot tolerate taking Plaquenil.  She will follow-up in the office in 6  weeks to assess her response to Plaquenil.  Patient was counseled on the purpose, proper use, and adverse effects of hydroxychloroquine including nausea/diarrhea, skin rash, headaches, and sun sensitivity.  Discussed importance of annual eye exams while on hydroxychloroquine to monitor to ocular toxicity and discussed importance of frequent laboratory monitoring.  Provided patient with eye exam form for baseline ophthalmologic exam.  Provided patient with educational materials on hydroxychloroquine and answered all questions.  Patient consented to hydroxychloroquine.  Will upload consent in the media tab.    Dose will be Plaquenil 200 mg twice daily.  Prescription pending lab results.  High risk medication use - Plaquenil 200 mg 1 tablet by mouth twice daily.  Advised to schedule baseline PLQ eye exam.  She was given a Plaquenil eye exam form to take with her to her upcoming appointment.  CBC, CMP, and G6PD drawn today.  Prescription pending results. - Plan: CBC with Differential/Platelet, COMPLETE METABOLIC PANEL WITH GFR, Glucose 6 phosphate dehydrogenase She has not had any recent infections.  She has received both COVID-19 vaccinations.  Fibromyalgia: She has generalized hyperalgesia and positive tender points on exam.  She continues to experience generalized myalgias, arthralgias, and significant fatigue.  We discussed the importance of regular exercise and good sleep hygiene.  Other medical conditions are listed as follows:   Vitamin D deficiency  History of hypertension  History of asthma  History of obsessive compulsive disorder  History of hidradenitis suppurativa  Orders: Orders Placed This Encounter  Procedures  . CBC with Differential/Platelet  . COMPLETE METABOLIC PANEL WITH GFR  . Glucose 6 phosphate dehydrogenase  . CBC with Differential/Platelet  . COMPLETE METABOLIC PANEL WITH GFR   No orders of the defined types were placed in this encounter.     Follow-Up  Instructions: Return in about 6 weeks (around 05/22/2020) for Autoimmune Disease.   Gearldine Bienenstock, PA-C  Note - This record has been created using Dragon software.  Chart creation errors have been sought, but may not always  have been located. Such creation errors do not reflect on  the standard of medical care.

## 2020-03-28 ENCOUNTER — Encounter: Payer: Self-pay | Admitting: Rheumatology

## 2020-03-28 NOTE — Telephone Encounter (Signed)
Spoke with patient and she did discuss with Dr. Corliss Skains. Looking at the note from last visit again it states: FMLA paperwork for the patient to miss 1-2 days a month for flares. Patient to resend the paperwork as it was not the compete paperwork. Will fill for patient once received.

## 2020-04-03 ENCOUNTER — Ambulatory Visit (INDEPENDENT_AMBULATORY_CARE_PROVIDER_SITE_OTHER): Payer: No Typology Code available for payment source | Admitting: Allergy & Immunology

## 2020-04-03 ENCOUNTER — Encounter: Payer: Self-pay | Admitting: Allergy & Immunology

## 2020-04-03 ENCOUNTER — Other Ambulatory Visit: Payer: Self-pay

## 2020-04-03 VITALS — BP 104/72 | HR 74 | Temp 97.9°F | Resp 18 | Ht 67.0 in | Wt 255.8 lb

## 2020-04-03 DIAGNOSIS — J302 Other seasonal allergic rhinitis: Secondary | ICD-10-CM | POA: Diagnosis not present

## 2020-04-03 DIAGNOSIS — J3089 Other allergic rhinitis: Secondary | ICD-10-CM | POA: Diagnosis not present

## 2020-04-03 DIAGNOSIS — L239 Allergic contact dermatitis, unspecified cause: Secondary | ICD-10-CM | POA: Diagnosis not present

## 2020-04-03 DIAGNOSIS — T7840XD Allergy, unspecified, subsequent encounter: Secondary | ICD-10-CM

## 2020-04-03 NOTE — Patient Instructions (Addendum)
1. Seasonal and perennial allergic rhinitis - Testing today showed: grasses, ragweed, weeds, trees, dust mites, cat and dog and horse - Copy of test results provided.  - Avoidance measures provided. - Stop taking: Claritin - Continue with: Singulair (montelukast) 10  mg daily - Start taking: Allegra (fexofenadine) 180mg  table once daily and Flonase (fluticasone) one spray per nostril daily - You can use an extra dose of the antihistamine, if needed, for breakthrough symptoms.  - Consider nasal saline rinses 1-2 times daily to remove allergens from the nasal cavities as well as help with mucous clearance (this is especially helpful to do before the nasal sprays are given) - Consider allergy shots as a means of long-term control. - Allergy shots "re-train" and "reset" the immune system to ignore environmental allergens and decrease the resulting immune response to those allergens (sneezing, itchy watery eyes, runny nose, nasal congestion, etc).    - Allergy shots improve symptoms in 75-85% of patients.   2. Allergic reaction with likely allergic contact dermatitis  - I am not sure what is causing your rashes, but we are going to do patch testing to see if you are reacting to any dyes or chemicals or whatnot.  - Make an appointment for that in the next few weeks.   3. Return in about 3 months (around 07/04/2020).    Please inform 09/01/2020 of any Emergency Department visits, hospitalizations, or changes in symptoms. Call us before going to the ED for breathing or allergy symptoms since we might be able to fit you in for a sick visit. Feel free to contact us anytime with any questions, problems, or concerns.  It was a pleasure to meet you today!  Websites that have reliable patient information: 1. American Academy of Asthma, Allergy, and Immunology: www.aaaai.org 2. Food Allergy Research and Education (FARE): foodallergy.org 3. Mothers of Asthmatics: http://www.asthmacommunitynetwork.org 4. American  College of Allergy, Asthma, and Immunology: www.acaai.org   COVID-19 Vaccine Information can be found at: [place] For questions related to vaccine distribution or appointments, please email [email or web address] or call [phone].     "Like" [phone] on Facebook and Instagram for our latest updates!     HAPPY FALL!     Make sure you are registered to vote! If you have moved or changed any of your contact information, you will need to get this updated before voting!  In some cases, you MAY be able to register to vote online: [place]    Reducing Pollen Exposure  The American Academy of Allergy, Asthma and Immunology suggests the following steps to reduce your exposure to pollen during allergy seasons.    1. Do not hang sheets or clothing out to dry; pollen may collect on these items. 2. Do not mow lawns or spend time around freshly cut grass; mowing stirs up pollen. 3. Keep windows closed at night.  Keep car windows closed while driving. 4. Minimize morning activities outdoors, a time when pollen counts are usually at their highest. 5. Stay indoors as much as possible when pollen counts or humidity is high and on windy days when pollen tends to remain in the air longer. 6. Use air conditioning when possible.  Many air conditioners have filters that trap the pollen spores. 7. Use a HEPA room air filter to remove pollen form the indoor air you breathe.  Control of Dog or Cat Allergen  Avoidance is the best way to manage a dog or cat allergy. If you have a dog or cat and are allergic  to dog or cats, consider removing the dog or cat from the home. If you have a dog or cat but don't want to find it a new home, or if your family wants a pet even though someone in the household is allergic, here are some strategies that may help keep symptoms at bay:  1. Keep the pet out of your  bedroom and restrict it to only a few rooms. Be advised that keeping the dog or cat in only one room will not limit the allergens to that room. 2. Don't pet, hug or kiss the dog or cat; if you do, wash your hands with soap and water. 3. High-efficiency particulate air (HEPA) cleaners run continuously in a bedroom or living room can reduce allergen levels over time. 4. Regular use of a high-efficiency vacuum cleaner or a central vacuum can reduce allergen levels. 5. Giving your dog or cat a bath at least once a week can reduce airborne allergen.  Control of Dust Mite Allergen    Dust mites play a major role in allergic asthma and rhinitis.  They occur in environments with high humidity wherever human skin is found.  Dust mites absorb humidity from the atmosphere (ie, they do not drink) and feed on organic matter (including shed human and animal skin).  Dust mites are a microscopic type of insect that you cannot see with the naked eye.  High levels of dust mites have been detected from mattresses, pillows, carpets, upholstered furniture, bed covers, clothes, soft toys and any woven material.  The principal allergen of the dust mite is found in its feces.  A gram of dust may contain 1,000 mites and 250,000 fecal particles.  Mite antigen is easily measured in the air during house cleaning activities.  Dust mites do not bite and do not cause harm to humans, other than by triggering allergies/asthma.    Ways to decrease your exposure to dust mites in your home:  1. Encase mattresses, box springs and pillows with a mite-impermeable barrier or cover   2. Wash sheets, blankets and drapes weekly in hot water (130 F) with detergent and dry them in a dryer on the hot setting.  3. Have the room cleaned frequently with a vacuum cleaner and a damp dust-mop.  For carpeting or rugs, vacuuming with a vacuum cleaner equipped with a high-efficiency particulate air (HEPA) filter.  The dust mite allergic individual should  not be in a room which is being cleaned and should wait 1 hour after cleaning before going into the room. 4. Do not sleep on upholstered furniture (eg, couches).   5. If possible removing carpeting, upholstered furniture and drapery from the home is ideal.  Horizontal blinds should be eliminated in the rooms where the person spends the most time (bedroom, study, television room).  Washable vinyl, roller-type shades are optimal. 6. Remove all non-washable stuffed toys from the bedroom.  Wash stuffed toys weekly like sheets and blankets above.   7. Reduce indoor humidity to less than 50%.  Inexpensive humidity monitors can be purchased at most hardware stores.  Do not use a humidifier as can make the problem worse and are not recommended.   Allergy Shots   Allergies are the result of a chain reaction that starts in the immune system. Your immune system controls how your body defends itself. For instance, if you have an allergy to pollen, your immune system identifies pollen as an invader or allergen. Your immune system overreacts by producing antibodies  called Immunoglobulin E (IgE). These antibodies travel to cells that release chemicals, causing an allergic reaction.  The concept behind allergy immunotherapy, whether it is received in the form of shots or tablets, is that the immune system can be desensitized to specific allergens that trigger allergy symptoms. Although it requires time and patience, the payback can be long-term relief.  How Do Allergy Shots Work?  Allergy shots work much like a vaccine. Your body responds to injected amounts of a particular allergen given in increasing doses, eventually developing a resistance and tolerance to it. Allergy shots can lead to decreased, minimal or no allergy symptoms.  There generally are two phases: build-up and maintenance. Build-up often ranges from three to six months and involves receiving injections with increasing amounts of the allergens. The  shots are typically given once or twice a week, though more rapid build-up schedules are sometimes used.  The maintenance phase begins when the most effective dose is reached. This dose is different for each person, depending on how allergic you are and your response to the build-up injections. Once the maintenance dose is reached, there are longer periods between injections, typically two to four weeks.  Occasionally doctors give cortisone-type shots that can temporarily reduce allergy symptoms. These types of shots are different and should not be confused with allergy immunotherapy shots.  Who Can Be Treated with Allergy Shots?  Allergy shots may be a good treatment approach for people with allergic rhinitis (hay fever), allergic asthma, conjunctivitis (eye allergy) or stinging insect allergy.   Before deciding to begin allergy shots, you should consider:  . The length of allergy season and the severity of your symptoms . Whether medications and/or changes to your environment can control your symptoms . Your desire to avoid long-term medication use . Time: allergy immunotherapy requires a major time commitment . Cost: may vary depending on your insurance coverage  Allergy shots for children age 42 and older are effective and often well tolerated. They might prevent the onset of new allergen sensitivities or the progression to asthma.  Allergy shots are not started on patients who are pregnant but can be continued on patients who become pregnant while receiving them. In some patients with other medical conditions or who take certain common medications, allergy shots may be of risk. It is important to mention other medications you talk to your allergist.   When Will I Feel Better?  Some may experience decreased allergy symptoms during the build-up phase. For others, it may take as long as 12 months on the maintenance dose. If there is no improvement after a year of maintenance, your  allergist will discuss other treatment options with you.  If you aren't responding to allergy shots, it may be because there is not enough dose of the allergen in your vaccine or there are missing allergens that were not identified during your allergy testing. Other reasons could be that there are high levels of the allergen in your environment or major exposure to non-allergic triggers like tobacco smoke.  What Is the Length of Treatment?  Once the maintenance dose is reached, allergy shots are generally continued for three to five years. The decision to stop should be discussed with your allergist at that time. Some people may experience a permanent reduction of allergy symptoms. Others may relapse and a longer course of allergy shots can be considered.  What Are the Possible Reactions?  The two types of adverse reactions that can occur with allergy shots are local  and systemic. Common local reactions include very mild redness and swelling at the injection site, which can happen immediately or several hours after. A systemic reaction, which is less common, affects the entire body or a particular body system. They are usually mild and typically respond quickly to medications. Signs include increased allergy symptoms such as sneezing, a stuffy nose or hives.  Rarely, a serious systemic reaction called anaphylaxis can develop. Symptoms include swelling in the throat, wheezing, a feeling of tightness in the chest, nausea or dizziness. Most serious systemic reactions develop within 30 minutes of allergy shots. This is why it is strongly recommended you wait in your doctor's office for 30 minutes after your injections. Your allergist is trained to watch for reactions, and his or her staff is trained and equipped with the proper medications to identify and treat them.  Who Should Administer Allergy Shots?  The preferred location for receiving shots is your prescribing allergist's office. Injections can  sometimes be given at another facility where the physician and staff are trained to recognize and treat reactions, and have received instructions by your prescribing allergist.

## 2020-04-03 NOTE — Progress Notes (Signed)
NEW PATIENT  Date of Service/Encounter:  04/03/20  Referring provider: Bryon LionsMoreira, Niall A, PA-C   Assessment:   Seasonal and perennial allergic rhinitis (grasses, ragweed, weeds, trees, dust mites, cat and dog and horse)  Allergic reaction - likely related to her environmental allergens (likely the dog dander)  Allergic contact dermatitis  Plan/Recommendations:   1. Seasonal and perennial allergic rhinitis - Testing today showed: grasses, ragweed, weeds, trees, dust mites, cat and dog and horse - Copy of test results provided.  - Avoidance measures provided. - Stop taking: Claritin - Continue with: Singulair (montelukast) 10  mg daily - Start taking: Allegra (fexofenadine) 180mg  table once daily and Flonase (fluticasone) one spray per nostril daily - You can use an extra dose of the antihistamine, if needed, for breakthrough symptoms.  - Consider nasal saline rinses 1-2 times daily to remove allergens from the nasal cavities as well as help with mucous clearance (this is especially helpful to do before the nasal sprays are given) - Consider allergy shots as a means of long-term control. - Allergy shots "re-train" and "reset" the immune system to ignore environmental allergens and decrease the resulting immune response to those allergens (sneezing, itchy watery eyes, runny nose, nasal congestion, etc).    - Allergy shots improve symptoms in 75-85% of patients.   2. Allergic reaction with likely allergic contact dermatitis  - I am not sure what is causing your rashes, but we are going to do patch testing to see if you are reacting to any dyes or chemicals or whatnot.  - Make an appointment for that in the next few weeks.   3. Return in about 3 months (around 07/04/2020).   Subjective:   Whitney Harmon is a 44 y.o. female presenting today for evaluation of  Chief Complaint  Patient presents with  . Allergic Rhinitis   . Food Intolerance    apple and peanuts    Myrtice L  Harmon has a history of the following: Patient Active Problem List   Diagnosis Date Noted  . Fibromyalgia 12/06/2016  . Vitamin D deficiency 12/06/2016  . History of asthma 12/06/2016  . Preventative health care 02/19/2015  . ANA positive 08/29/2014  . Bilateral leg edema 01/14/2014  . OCD (obsessive compulsive disorder) 12/26/2013  . Leg wound, left 03/23/2013  . Elevated antinuclear antibody (ANA) level 05/12/2011  . Hidradenitis 02/06/2011  . Acne 09/20/2010  . Weight gain 09/20/2010  . 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 Obesity 04/22/2010  . HYPERHIDROSIS 10/15/2009  . GENITAL HERPES 09/12/2009  . Anxiety and depression 06/18/2009  . Essential hypertension 06/18/2009    History obtained from: chart review and patient.  Whitney Harmon was referred by Bryon LionsMoreira, Niall A, PA-C.     Whitney Harmon is a 44 y.o. female presenting for an evaluation of hives and allergic rhinitis.  She reports that she is here today because she was being treated for worsening reactions to dogs. It was felt that this was related to the dog exposure. He is a mix with boxer and Rhodesian. His name is a [facility]. She reports that she is not having spurts of itchy watery eyes and runny nose. She was having episodes of breaking out in hives. She reports that she was having puffiness below her eyes.   Evidently there was some concern with lupus when she originally went to see an allergist. She has never had hives. She is being followed for a positive ANA and apparently she is going to be starting  some medication. Her last visit with Dr. Corliss Skains was October 2021.  There is also discussion of diagnosing her with fibromyalgia.  She does have FMLA in place through her rheumatologist.  She has been avoiding apples, fish, and peanut since the test from 10 years ago. She does take an allergy pill daily to help with this. She can eat apples and peanuts and does fine with this.  It is unclear why this testing was ever performed,  but it seems she can eat certain amounts of these foods anyway.  Otherwise, there is no history of other atopic diseases, including asthma, drug allergies, stinging insect allergies, eczema, urticaria or contact dermatitis. There is no significant infectious history. Vaccinations are up to date.    Past Medical History: Patient Active Problem List   Diagnosis Date Noted  . Fibromyalgia 12/06/2016  . Vitamin D deficiency 12/06/2016  . History of asthma 12/06/2016  . Preventative health care 02/19/2015  . ANA positive 08/29/2014  . Bilateral leg edema 01/14/2014  . OCD (obsessive compulsive disorder) 12/26/2013  . Leg wound, left 03/23/2013  . Elevated antinuclear antibody (ANA) level 05/12/2011  . Hidradenitis 02/06/2011  . Acne 09/20/2010  . Weight gain 09/20/2010  . 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 Obesity 04/22/2010  . HYPERHIDROSIS 10/15/2009  . GENITAL HERPES 09/12/2009  . Anxiety and depression 06/18/2009  . Essential hypertension 06/18/2009    Medication List:  Allergies as of 04/03/2020      Reactions   Fish Oil Anaphylaxis   TUNA OIL   Peanut Butter Flavor Anaphylaxis   Peanuts [peanut Oil] Itching   Has Epi pen, though states only has itching/hives   Apple Hives, Itching   Apples   Vilazodone Hcl Diarrhea      Medication List       Accurate as of April 03, 2020  6:11 PM. If you have any questions, ask your nurse or doctor.        ALPRAZolam 0.25 MG tablet Commonly known as: XANAX TAKE ONE TABLET BY MOUTH THREE TIMES DAILY AS NEEDED FOR ANXIETY OR SLEEP   amLODipine 10 MG tablet Commonly known as: NORVASC Take 1 tablet (10 mg total) by mouth daily.   buPROPion 150 MG 24 hr tablet Commonly known as: WELLBUTRIN XL Take 3 tablets by mouth daily.   chlorthalidone 25 MG tablet Commonly known as: HYGROTON chlorthalidone 25 mg tablet  TAKE 1 2 (ONE HALF) TABLET BY MOUTH ONCE DAILY   cloNIDine 0.1 MG tablet Commonly known as: CATAPRES TAKE ONE  TABLET BY MOUTH TWICE DAILY   EPIPEN IJ Inject as directed as needed.   FLUoxetine 20 MG/5ML solution Commonly known as: PROZAC Take 15 mLs (60 mg total) by mouth daily.   Larin Fe 1/20 1-20 MG-MCG tablet Generic drug: norethindrone-ethinyl estradiol Take 1 tablet by mouth daily.   lisinopril 5 MG tablet Commonly known as: ZESTRIL daily.   lisinopril 20 MG tablet Commonly known as: ZESTRIL Take 20 mg by mouth daily.   loratadine 10 MG tablet Commonly known as: CLARITIN Take 10 mg by mouth daily as needed.   montelukast 10 MG tablet Commonly known as: SINGULAIR Take 10 mg by mouth at bedtime.   mupirocin ointment 2 % Commonly known as: BACTROBAN Apply topically 3 (three) times daily as needed.   Norethindrone-Ethinyl Estradiol-Fe Biphas 1 MG-10 MCG / 10 MCG tablet Commonly known as: LO LOESTRIN FE Take by mouth.   Vyvanse 50 MG capsule Generic drug: lisdexamfetamine Take 50 mg by mouth daily  as needed.       Birth History: non-contributory  Developmental History: non-contributory  Past Surgical History: Past Surgical History:  Procedure Laterality Date  . LAPAROSCOPIC GASTRIC SLEEVE RESECTION  12/31/14  . SALPINGOOPHORECTOMY  2007   left     Family History: Family History  Problem Relation Age of Onset  . Arthritis Mother        osteoarthritis  . Diabetes Mother   . Hypertension Mother   . Mental illness Mother        schizophrenia  . Obesity Mother   . Schizophrenia Mother   . Hypertension Father   . Alcohol abuse Other   . Diabetes Other   . Hypertension Other   . Healthy Son   . Hypertension Sister      Social History: Amiee lives at home with her family.  They live in a house.  There is no mildew damage.  They have central cooling and gas heating.  There are dogs inside of the home, who do go into the bedrooms as well.  There are no dust mite covers on the bedding.  There is tobacco exposure in the house as well as the car.  Mom works as a  Pharmacist, hospital for the past 8 years.  She works from home.  She is not exposed to fumes, chemicals, or dust.  She does not use a HEPA filter in the home.  They do not live near an interstate or industrial area.   Review of Systems  Constitutional: Negative.  Negative for chills, fever, malaise/fatigue and weight loss.  HENT: Positive for congestion. Negative for ear discharge, ear pain and sinus pain.   Eyes: Negative for pain, discharge and redness.  Respiratory: Negative for cough, sputum production, shortness of breath and wheezing.   Cardiovascular: Negative.  Negative for chest pain and palpitations.  Gastrointestinal: Negative for abdominal pain, constipation, diarrhea, heartburn, nausea and vomiting.  Skin: Positive for itching and rash.  Neurological: Negative for dizziness and headaches.  Endo/Heme/Allergies: Positive for environmental allergies. Does not bruise/bleed easily.       Objective:   Blood pressure 104/72, pulse 74, temperature 97.9 F (36.6 C), temperature source Temporal, resp. rate 18, height 5\' 7"  (1.702 m), weight 255 lb 12.8 oz (116 kg), SpO2 99 %. Body mass index is 40.06 kg/m.   Physical Exam:   Physical Exam Constitutional:      Appearance: She is well-developed. She is obese.  HENT:     Head: Normocephalic and atraumatic.     Right Ear: Tympanic membrane, ear canal and external ear normal. No drainage, swelling or tenderness. Tympanic membrane is not injected, scarred, erythematous, retracted or bulging.     Left Ear: Tympanic membrane, ear canal and external ear normal. No drainage, swelling or tenderness. Tympanic membrane is not injected, scarred, erythematous, retracted or bulging.     Nose: No nasal deformity, septal deviation, mucosal edema or rhinorrhea.     Right Turbinates: Enlarged and swollen.     Left Turbinates: Enlarged and swollen.     Right Sinus: No maxillary sinus tenderness or frontal sinus tenderness.     Left Sinus: No  maxillary sinus tenderness or frontal sinus tenderness.     Comments: Pale turbinates bilaterally.    Mouth/Throat:     Mouth: Mucous membranes are not pale and not dry.     Pharynx: Uvula midline.     Comments: Cobblestoning present in the posterior oropharynx. Eyes:     General:  Right eye: No discharge.        Left eye: No discharge.     Conjunctiva/sclera: Conjunctivae normal.     Right eye: Right conjunctiva is not injected. No chemosis.    Left eye: Left conjunctiva is not injected. No chemosis.    Pupils: Pupils are equal, round, and reactive to light.  Cardiovascular:     Rate and Rhythm: Normal rate and regular rhythm.     Heart sounds: Normal heart sounds.  Pulmonary:     Effort: Pulmonary effort is normal. No tachypnea, accessory muscle usage or respiratory distress.     Breath sounds: Normal breath sounds. No wheezing, rhonchi or rales.     Comments: Moving air well in all lung fields.  No increased work of breathing. Chest:     Chest wall: No tenderness.  Abdominal:     Tenderness: There is no abdominal tenderness. There is no guarding or rebound.  Lymphadenopathy:     Head:     Right side of head: No submandibular, tonsillar or occipital adenopathy.     Left side of head: No submandibular, tonsillar or occipital adenopathy.     Cervical: No cervical adenopathy.  Skin:    Coloration: Skin is not pale.     Findings: No abrasion, erythema, petechiae or rash. Rash is not papular, urticarial or vesicular.     Comments: There is some roughened areas on her bilateral arms, but otherwise eczema seems to be under good control.  Neurological:     Mental Status: She is alert.  Psychiatric:        Behavior: Behavior is cooperative.      Diagnostic studies:   Allergy Studies:     Airborne Adult Perc - 04/03/20 1452    Time Antigen Placed 1452    Allergen Manufacturer Waynette Buttery    Location Back    Number of Test 59    Panel 1 Select    1. Control-Buffer 50%  Glycerol Negative    2. Control-Histamine 1 mg/ml 2+    3. Albumin saline Negative    4. Bahia 3+    5. [place] 4+    6. Johnson 4+    7. Kentucky Blue 4+    8. Meadow Fescue 4+    9. Perennial Rye 4+    10. Sweet Vernal 3+    11. Timothy 3+    12. Cocklebur Negative    13. Burweed Marshelder Negative    14. Ragweed, short Negative    15. Ragweed, Giant Negative    16. Plantain,  English 2+    17. Lamb's Quarters Negative    18. Sheep Sorrell 2+    19. Rough Pigweed Negative    20. Marsh Elder, Rough Negative    21. Mugwort, Common 2+    22. Ash mix 2+    23. Birch mix 2+    24. Beech American Negative    25. Box, Elder Negative    26. Cedar, red Negative    27. Cottonwood, Guinea[place] Negative    28. Elm mix Negative    29. Hickory Negative    30. Maple mix 3+    31. Oak, [place] mix 2+    32. Pecan Pollen Negative    33. Pine mix Negative    34. Sycamore Eastern Negative    35. Walnut, Black Pollen 2+    36. Alternaria alternata Negative    37. Cladosporium Herbarum Negative    38. Aspergillus mix Negative  39. Penicillium mix Negative    40. Bipolaris sorokiniana (Helminthosporium) Negative    41. Drechslera spicifera (Curvularia) Negative    42. Mucor plumbeus Negative    43. Fusarium moniliforme Negative    44. Aureobasidium pullulans (pullulara) Negative    45. Rhizopus oryzae Negative    46. Botrytis cinera Negative    47. Epicoccum nigrum Negative    48. Phoma betae Negative    49. Candida Albicans Negative    50. Trichophyton mentagrophytes Negative    51. Mite, D Farinae  5,000 AU/ml 2+    52. Mite, D Pteronyssinus  5,000 AU/ml 2+    53. Cat Hair 10,000 BAU/ml 3+    54.  Dog Epithelia 2+    55. Mixed Feathers Negative    56. Horse Epithelia 2+    57. Cockroach, German Negative    58. Mouse Negative    59. Tobacco Leaf Negative          Food Adult Perc - 04/03/20 1400    Time Antigen Placed 1452    Allergen Manufacturer Waynette Buttery    Location Back      Number of allergen test 3    Panel 2 Select    Control-Histamine 1 mg/ml 2+    1. Peanut Negative    9. Fish Mix Negative    58. Apple Negative           Allergy testing results were read and interpreted by myself, documented by clinical staff.         Malachi Bonds, MD [facility]

## 2020-04-05 ENCOUNTER — Telehealth: Payer: Self-pay

## 2020-04-05 NOTE — Telephone Encounter (Signed)
Patient called stating the start date on her FMLA paperwork needs to change from November to 01/08/20.  Patient states her employer states it is an easy fix and requested the doctor change the date and resend the paperwork.  Please advise.

## 2020-04-05 NOTE — Telephone Encounter (Signed)
Attempted to contact the patient and left message for patient to call the office.  

## 2020-04-10 ENCOUNTER — Telehealth: Payer: Self-pay | Admitting: *Deleted

## 2020-04-10 ENCOUNTER — Ambulatory Visit (INDEPENDENT_AMBULATORY_CARE_PROVIDER_SITE_OTHER): Payer: No Typology Code available for payment source | Admitting: Physician Assistant

## 2020-04-10 ENCOUNTER — Other Ambulatory Visit: Payer: Self-pay

## 2020-04-10 ENCOUNTER — Encounter: Payer: Self-pay | Admitting: Physician Assistant

## 2020-04-10 VITALS — BP 136/91 | HR 79 | Ht 66.5 in | Wt 255.0 lb

## 2020-04-10 DIAGNOSIS — R768 Other specified abnormal immunological findings in serum: Secondary | ICD-10-CM

## 2020-04-10 DIAGNOSIS — Z8709 Personal history of other diseases of the respiratory system: Secondary | ICD-10-CM

## 2020-04-10 DIAGNOSIS — E559 Vitamin D deficiency, unspecified: Secondary | ICD-10-CM | POA: Diagnosis not present

## 2020-04-10 DIAGNOSIS — M359 Systemic involvement of connective tissue, unspecified: Secondary | ICD-10-CM | POA: Diagnosis not present

## 2020-04-10 DIAGNOSIS — M797 Fibromyalgia: Secondary | ICD-10-CM

## 2020-04-10 DIAGNOSIS — Z8679 Personal history of other diseases of the circulatory system: Secondary | ICD-10-CM

## 2020-04-10 DIAGNOSIS — Z79899 Other long term (current) drug therapy: Secondary | ICD-10-CM

## 2020-04-10 DIAGNOSIS — Z8659 Personal history of other mental and behavioral disorders: Secondary | ICD-10-CM

## 2020-04-10 DIAGNOSIS — Z872 Personal history of diseases of the skin and subcutaneous tissue: Secondary | ICD-10-CM

## 2020-04-10 LAB — CBC WITH DIFFERENTIAL/PLATELET
Basophils Relative: 0.7 %
MCV: 75.6 fL — ABNORMAL LOW (ref 80.0–100.0)
Neutrophils Relative %: 69.2 %
RBC: 5.58 10*6/uL — ABNORMAL HIGH (ref 3.80–5.10)
WBC: 8.6 10*3/uL (ref 3.8–10.8)

## 2020-04-10 NOTE — Patient Instructions (Addendum)
Standing Labs We placed an order today for your standing lab work.   Please have your standing labs drawn in 1 month, 3 months, then every 5 months   If possible, please have your labs drawn 2 weeks prior to your appointment so that the provider can discuss your results at your appointment.  We have open lab daily Monday through Thursday from 8:30-12:30 PM and 1:30-4:30 PM and Friday from 8:30-12:30 PM and 1:30-4:00 PM at the office of Dr. Shaili Deveshwar, [facility].   Please be advised, patients with office appointments requiring lab work will take precedents over walk-in lab work.  If possible, please come for your lab work on Monday and Friday afternoons, as you may experience shorter wait times. The office is located at [address], [address], [place], [place] [address] No appointment is necessary.   Labs are drawn by [facility]. Please bring your co-pay at the time of your lab draw.  You may receive a bill from [facility] for your lab work.  If you wish to have your labs drawn at another location, please call the office 24 hours in advance to send orders.  If you have any questions regarding directions or hours of operation,  please call [phone].   As a reminder, please drink plenty of water prior to coming for your lab work. Thanks!   Hydroxychloroquine tablets What is this medicine? HYDROXYCHLOROQUINE (hye drox ee KLOR oh kwin) is used to treat rheumatoid arthritis and systemic lupus erythematosus. It is also used to treat malaria. This medicine may be used for other purposes; ask your health care provider or pharmacist if you have questions. COMMON BRAND NAME(S): Plaquenil, Quineprox What should I tell my health care provider before I take this medicine? They need to know if you have any of these conditions:  diabetes  eye disease, vision problems  G6PD deficiency  heart disease  history of irregular heartbeat  if you often drink alcohol  kidney  disease  liver disease  porphyria  psoriasis  an unusual or allergic reaction to chloroquine, hydroxychloroquine, other medicines, foods, dyes, or preservatives  pregnant or trying to get pregnant  breast-feeding How should I use this medicine? Take this medicine by mouth with a glass of water. Follow the directions on the prescription label. Do not cut, crush or chew this medicine. Swallow the tablets whole. Take this medicine with food. Avoid taking antacids within 4 hours of taking this medicine. It is best to separate these medicines by at least 4 hours. Take your medicine at regular intervals. Do not take it more often than directed. Take all of your medicine as directed even if you think you are better. Do not skip doses or stop your medicine early. Talk to your pediatrician regarding the use of this medicine in children. While this drug may be prescribed for selected conditions, precautions do apply. Overdosage: If you think you have taken too much of this medicine contact a poison control center or emergency room at once. NOTE: This medicine is only for you. Do not share this medicine with others. What if I miss a dose? If you miss a dose, take it as soon as you can. If it is almost time for your next dose, take only that dose. Do not take double or extra doses. What may interact with this medicine? Do not take this medicine with any of the following medications:  cisapride  dronedarone  pimozide  thioridazine This medicine may also interact with the   the following medications:  ampicillin  antacids  cimetidine  cyclosporine  digoxin  kaolin  medicines for diabetes, like insulin, glipizide, glyburide  medicines for seizures like carbamazepine, phenobarbital, phenytoin  mefloquine  methotrexate  other medicines that prolong the QT interval (cause an abnormal heart rhythm)  praziquantel This list may not describe all possible interactions. Give your health care  provider a list of all the medicines, herbs, non-prescription drugs, or dietary supplements you use. Also tell them if you smoke, drink alcohol, or use illegal drugs. Some items may interact with your medicine. What should I watch for while using this medicine? Visit your health care professional for regular checks on your progress. Tell your health care professional if your symptoms do not start to get better or if they get worse. You may need blood work done while you are taking this medicine. If you take other medicines that can affect heart rhythm, you may need more testing. Talk to your health care professional if you have questions. Your vision may be tested before and during use of this medicine. Tell your health care professional right away if you have any change in your eyesight. What side effects may I notice from receiving this medicine? Side effects that you should report to your doctor or health care professional as soon as possible:  allergic reactions like skin rash, itching or hives, swelling of the face, lips, or tongue  changes in vision  decreased hearing or ringing of the ears  muscle weakness  redness, blistering, peeling or loosening of the skin, including inside the mouth  sensitivity to light  signs and symptoms of a dangerous change in heartbeat or heart rhythm like chest pain; dizziness; fast or irregular heartbeat; palpitations; feeling faint or lightheaded, falls; breathing problems  signs and symptoms of liver injury like dark yellow or brown urine; general ill feeling or flu-like symptoms; light-colored stools; loss of appetite; nausea; right upper belly pain; unusually weak or tired; yellowing of the eyes or skin  signs and symptoms of low blood sugar such as feeling anxious; confusion; dizziness; increased hunger; unusually weak or tired; sweating; shakiness; cold; irritable; headache; blurred vision; fast heartbeat; loss of consciousness  suicidal  thoughts  uncontrollable head, mouth, neck, arm, or leg movements Side effects that usually do not require medical attention (report to your doctor or health care professional if they continue or are bothersome):  diarrhea  dizziness  hair loss  headache  irritable  loss of appetite  nausea, vomiting  stomach pain This list may not describe all possible side effects. Call your doctor for medical advice about side effects. You may report side effects to FDA at 1-800-FDA-1088. Where should I keep my medicine? Keep out of the reach of children. Store at room temperature between 15 and 30 degrees C (59 and 86 degrees F). Protect from moisture and light. Throw away any unused medicine after the expiration date. NOTE: This sheet is a summary. It may not cover all possible information. If you have questions about this medicine, talk to your doctor, pharmacist, or health care provider.  2020 Elsevier/Gold Standard (2018-09-20 12:56:32)     Hip Bursitis Rehab Ask your health care provider which exercises are safe for you. Do exercises exactly as told by your health care provider and adjust them as directed. It is normal to feel mild stretching, pulling, tightness, or discomfort as you do these exercises. Stop right away if you feel sudden pain or your pain gets worse. Do not  begin these exercises until told by your health care provider. Stretching exercise This exercise warms up your muscles and joints and improves the movement and flexibility of your hip. This exercise also helps to relieve pain and stiffness. Iliotibial band stretch An iliotibial band is a strong band of muscle tissue that runs from the outer side of your hip to the outer side of your thigh and knee. 1. Lie on your side with your left / right leg in the top position. 2. Bend your left / right knee and grab your ankle. Stretch out your bottom arm to help you balance. 3. Slowly bring your knee back so your thigh is  behind your body. 4. Slowly lower your knee toward the floor until you feel a gentle stretch on the outside of your left / right thigh. If you do not feel a stretch and your knee will not fall farther, place the heel of your other foot on top of your knee and pull your knee down toward the floor with your foot. 5. Hold this position for __________ seconds. 6. Slowly return to the starting position. Repeat __________ times. Complete this exercise __________ times a day. Strengthening exercises These exercises build strength and endurance in your hip and pelvis. Endurance is the ability to use your muscles for a long time, even after they get tired. Bridge This exercise strengthens the muscles that move your thigh backward (hip extensors). 1. Lie on your back on a firm surface with your knees bent and your feet flat on the floor. 2. Tighten your buttocks muscles and lift your buttocks off the floor until your trunk is level with your thighs. ? Do not arch your back. ? You should feel the muscles working in your buttocks and the back of your thighs. If you do not feel these muscles, slide your feet 1-2 inches (2.5-5 cm) farther away from your buttocks. ? If this exercise is too easy, try doing it with your arms crossed over your chest. 3. Hold this position for __________ seconds. 4. Slowly lower your hips to the starting position. 5. Let your muscles relax completely after each repetition. Repeat __________ times. Complete this exercise __________ times a day. Squats This exercise strengthens the muscles in front of your thigh and knee (quadriceps). 1. Stand in front of a table, with your feet and knees pointing straight ahead. You may rest your hands on the table for balance but not for support. 2. Slowly bend your knees and lower your hips like you are going to sit in a chair. ? Keep your weight over your heels, not over your toes. ? Keep your lower legs upright so they are parallel with the  table legs. ? Do not let your hips go lower than your knees. ? Do not bend lower than told by your health care provider. ? If your hip pain increases, do not bend as low. 3. Hold the squat position for __________ seconds. 4. Slowly push with your legs to return to standing. Do not use your hands to pull yourself to standing. Repeat __________ times. Complete this exercise __________ times a day. Hip hike 1. Stand sideways on a bottom step. Stand on your left / right leg with your other foot unsupported next to the step. You can hold on to the railing or wall for balance if needed. 2. Keep your knees straight and your torso square. Then lift your left / right hip up toward the ceiling. 3. Hold this position for  __________ seconds. 4. Slowly let your left / right hip lower toward the floor, past the starting position. Your foot should get closer to the floor. Do not lean or bend your knees. Repeat __________ times. Complete this exercise __________ times a day. Single leg stand 1. Without shoes, stand near a railing or in a doorway. You may hold on to the railing or door frame as needed for balance. 2. Squeeze your left / right buttock muscles, then lift up your other foot. ? Do not let your left / right hip push out to the side. ? It is helpful to stand in front of a mirror for this exercise so you can watch your hip. 3. Hold this position for __________ seconds. Repeat __________ times. Complete this exercise __________ times a day. This information is not intended to replace advice given to you by your health care provider. Make sure you discuss any questions you have with your health care provider. Document Revised: 09/06/2018 Document Reviewed: 09/06/2018 Elsevier Patient Education  2020 [employer].

## 2020-04-10 NOTE — Telephone Encounter (Signed)
Please call patient regarding FMLA paperwork. Patient is requesting FMLA paperwork back dated to August, 2021? Thank you.

## 2020-04-11 ENCOUNTER — Other Ambulatory Visit: Payer: Self-pay | Admitting: Radiology

## 2020-04-11 DIAGNOSIS — M359 Systemic involvement of connective tissue, unspecified: Secondary | ICD-10-CM

## 2020-04-11 LAB — COMPLETE METABOLIC PANEL WITH GFR
AG Ratio: 1.3 (calc) (ref 1.0–2.5)
ALT: 9 U/L (ref 6–29)
AST: 10 U/L (ref 10–30)
Albumin: 3.9 g/dL (ref 3.6–5.1)
Alkaline phosphatase (APISO): 59 U/L (ref 31–125)
BUN: 13 mg/dL (ref 7–25)
CO2: 29 mmol/L (ref 20–32)
Calcium: 9.4 mg/dL (ref 8.6–10.2)
Chloride: 102 mmol/L (ref 98–110)
Creat: 1.05 mg/dL (ref 0.50–1.10)
GFR, Est African American: 76 mL/min/{1.73_m2} (ref >=60)
GFR, Est Non African American: 66 mL/min/{1.73_m2} (ref >=60)
Globulin: 3.1 g/dL (calc) (ref 1.9–3.7)
Glucose, Bld: 80 mg/dL (ref 65–99)
Potassium: 3.9 mmol/L (ref 3.5–5.3)
Sodium: 139 mmol/L (ref 135–146)
Total Bilirubin: 0.4 mg/dL (ref 0.2–1.2)
Total Protein: 7 g/dL (ref 6.1–8.1)

## 2020-04-11 LAB — CBC WITH DIFFERENTIAL/PLATELET
Absolute Monocytes: 456 cells/uL (ref 200–950)
Basophils Absolute: 60 cells/uL (ref 0–200)
Eosinophils Absolute: 86 cells/uL (ref 15–500)
Eosinophils Relative: 1 %
HCT: 42.2 % (ref 35.0–45.0)
Hemoglobin: 13.1 g/dL (ref 11.7–15.5)
Lymphs Abs: 2047 cells/uL (ref 850–3900)
MCH: 23.5 pg — ABNORMAL LOW (ref 27.0–33.0)
MCHC: 31 g/dL — ABNORMAL LOW (ref 32.0–36.0)
MPV: 10.8 fL (ref 7.5–12.5)
Monocytes Relative: 5.3 %
Neutro Abs: 5951 cells/uL (ref 1500–7800)
Platelets: 461 10*3/uL — ABNORMAL HIGH (ref 140–400)
RDW: 13.1 % (ref 11.0–15.0)
Total Lymphocyte: 23.8 %

## 2020-04-11 LAB — GLUCOSE 6 PHOSPHATE DEHYDROGENASE: G-6PDH: 20.8 U/g Hgb — ABNORMAL HIGH (ref 7.0–20.5)

## 2020-04-11 MED ORDER — HYDROXYCHLOROQUINE SULFATE 200 MG PO TABS: ORAL_TABLET | ORAL | 0 refills | Status: DC

## 2020-04-11 NOTE — Telephone Encounter (Signed)
FMLA situation was not discussed and was not documented in the August office visit note.  The date of FMLA cannot be changed.  Although she can discuss the situation with her PCP who has been seeing patient for a longer time and may be able to help.

## 2020-04-11 NOTE — Telephone Encounter (Signed)
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  Ok to start on plaquenil 200 mg 1 tablet by mouth twice daily.

## 2020-04-11 NOTE — Telephone Encounter (Signed)
Per Sherron Ales, PA-C - okay to start PLQ 200mg  twice daily.

## 2020-04-11 NOTE — Telephone Encounter (Signed)
Patient advised FMLA situation was not discussed and was not documented in the August office visit note.  The date of FMLA cannot be changed. Patient advised the office note on 01/23/2020 had no documentation about FMLA. Patient advised that it was not documented until 03/08/2020.  Patient requested to talk to Jasmine December C and was transferred.

## 2020-04-11 NOTE — Telephone Encounter (Signed)
I spoke to patient and advised FMLA papers were not approved at the 01/23/2020 visit. FMLA was not approved until 03/08/2020. Dates cannot be retroactive.

## 2020-04-11 NOTE — Progress Notes (Signed)
RBC count is elevated.  Hemoglobin and hematocrit WNL.  Plt count elevated.  We will continue to monitor.   CMP WNL.

## 2020-04-11 NOTE — Progress Notes (Signed)
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  Ok to start on plaquenil 200 mg 1 tablet by mouth twice daily.

## 2020-04-23 ENCOUNTER — Ambulatory Visit: Payer: No Typology Code available for payment source | Admitting: Family

## 2020-04-30 ENCOUNTER — Ambulatory Visit: Payer: No Typology Code available for payment source | Admitting: Family

## 2020-05-14 NOTE — Progress Notes (Deleted)
Office Visit Note  Patient: Whitney Harmon             Date of Birth: Mar 16, 1976           MRN: [id number]             PCP: Bryon Lions PA-C Referring: Bryon Lions, PA-C Visit Date: 05/28/2020 Occupation: @GUAROCC @  Subjective:  No chief complaint on file.   History of Present Illness: Whitney Harmon is a 44 y.o. female ***   Activities of Daily Living:  Patient reports morning stiffness for *** {minute/hour:19697}.   Patient {ACTIONS;DENIES/REPORTS:21021675::"Denies"} nocturnal pain.  Difficulty dressing/grooming: {ACTIONS;DENIES/REPORTS:21021675::"Denies"} Difficulty climbing stairs: {ACTIONS;DENIES/REPORTS:21021675::"Denies"} Difficulty getting out of chair: {ACTIONS;DENIES/REPORTS:21021675::"Denies"} Difficulty using hands for taps, buttons, cutlery, and/or writing: {ACTIONS;DENIES/REPORTS:21021675::"Denies"}  No Rheumatology ROS completed.   PMFS History:  Patient Active Problem List   Diagnosis Date Noted  . Fibromyalgia 12/06/2016  . Vitamin D deficiency 12/06/2016  . History of asthma 12/06/2016  . Preventative health care 02/19/2015  . ANA positive 08/29/2014  . Bilateral leg edema 01/14/2014  . OCD (obsessive compulsive disorder) 12/26/2013  . Leg wound, left 03/23/2013  . Elevated antinuclear antibody (ANA) level 05/12/2011  . Hidradenitis 02/06/2011  . Acne 09/20/2010  . Weight gain 09/20/2010  . 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 Obesity 04/22/2010  . HYPERHIDROSIS 10/15/2009  . GENITAL HERPES 09/12/2009  . Anxiety and depression 06/18/2009  . Essential hypertension 06/18/2009    Past Medical History:  Diagnosis Date  . Asthma    childhood  . Elevated antinuclear antibody (ANA) level 05/12/2011  . Hypertension   . Overweight(278.02)     Family History  Problem Relation Age of Onset  . Arthritis Mother        osteoarthritis  . Diabetes Mother   . Hypertension Mother   . Mental illness Mother        schizophrenia  . Obesity  Mother   . Schizophrenia Mother   . Hypertension Father   . Alcohol abuse Other   . Diabetes Other   . Hypertension Other   . Healthy Son   . Hypertension Sister    Past Surgical History:  Procedure Laterality Date  . LAPAROSCOPIC GASTRIC SLEEVE RESECTION  12/31/14  . 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 PFIZER SARS-COV-2 Vaccination 11/21/2019, 12/19/2019  . PPD Test 04/05/2015  . Tdap 03/13/2013     Objective: Vital Signs: There were no vitals taken for this visit.   Physical Exam   Musculoskeletal Exam: ***  CDAI Exam: CDAI Score: -- Patient Global: --; Provider Global: -- Swollen: --; Tender: -- Joint Exam 05/28/2020   No joint exam has been documented for this visit   There is currently no information documented on the homunculus. Go to the Rheumatology activity and complete the homunculus joint exam.  Investigation: No additional findings.  Imaging: No results found.  Recent Labs: Lab Results  Component Value Date   WBC 8.6 04/10/2020   HGB 13.1 04/10/2020   PLT 461 (H) 04/10/2020   NA 139 04/10/2020   K 3.9 04/10/2020   CL 102 04/10/2020   CO2 29 04/10/2020   GLUCOSE 80 04/10/2020   BUN 13 04/10/2020   CREATININE 1.05 04/10/2020   BILITOT 0.4 04/10/2020   ALKPHOS 55 12/08/2016   AST 10 04/10/2020   ALT 9 04/10/2020  PROT 7.0 04/10/2020   ALBUMIN 4.0 12/08/2016   CALCIUM 9.4 04/10/2020   GFRAA 76 04/10/2020    Speciality Comments: No specialty comments available.  Procedures:  No procedures performed Allergies: Fish oil, Peanut butter flavor, Peanuts [peanut oil], Apple, and Vilazodone hcl   Assessment / Plan:     Visit Diagnoses: No diagnosis found.  Orders: No orders of the defined types were placed in this encounter.  No orders of the defined types were placed in this encounter.   Face-to-face  time spent with patient was *** minutes. Greater than 50% of time was spent in counseling and coordination of care.  Follow-Up Instructions: No follow-ups on file.   Ellen Henri, CMA  Note - This record has been created using Animal nutritionist.  Chart creation errors have been sought, but may not always  have been located. Such creation errors do not reflect on  the standard of medical care.

## 2020-05-28 ENCOUNTER — Ambulatory Visit: Payer: No Typology Code available for payment source | Admitting: Rheumatology

## 2020-06-08 NOTE — Progress Notes (Signed)
Office Visit Note  Patient: Whitney Harmon             Date of Birth: [birth year]           MRN: [id number]             PCP: Bryon Lions PA-C Referring: Bryon Lions, PA-C Visit Date: 06/22/2020 Occupation: @GUAROCC @  Subjective:  Generalized pain and fatigue.   History of Present Illness: Whitney Harmon is a 45 y.o. female with history of autoimmune disease and fibromyalgia syndrome.  She states she started taking Plaquenil towards the beginning of December.  She has not noticed any improvement so far.  She continues to have fatigue and joint pain.  She states her hands feel puffy especially in the morning.  She gives history of dry mouth and occasional ulcers.  Has been experiencing generalized pain and discomfort from fibromyalgia.  She complains of discomfort over bilateral trochanteric area.  Activities of Daily Living:  Patient reports morning stiffness for 30 minutes.   Patient Reports nocturnal pain.  Difficulty dressing/grooming: Denies Difficulty climbing stairs: Denies Difficulty getting out of chair: Denies Difficulty using hands for taps, buttons, cutlery, and/or writing: Denies  Review of Systems  Constitutional: Positive for fatigue.  HENT: Positive for mouth sores and mouth dryness. Negative for nose dryness.   Eyes: Negative for pain, itching and dryness.  Respiratory: Negative for shortness of breath and difficulty breathing.   Cardiovascular: Negative for chest pain and palpitations.  Gastrointestinal: Positive for constipation. Negative for blood in stool and diarrhea.  Endocrine: Negative for increased urination.  Genitourinary: Negative for difficulty urinating.  Musculoskeletal: Positive for arthralgias, joint pain and morning stiffness. Negative for joint swelling, myalgias, muscle tenderness and myalgias.  Skin: Negative for color change, rash and redness.  Allergic/Immunologic: Positive for susceptible to infections.  Neurological: Positive  for dizziness, headaches and weakness.  Hematological: Positive for bruising/bleeding tendency.  Psychiatric/Behavioral: Negative for confusion.    PMFS History:  Patient Active Problem List   Diagnosis Date Noted  . Fibromyalgia 12/06/2016  . Vitamin D deficiency 12/06/2016  . History of asthma 12/06/2016  . Preventative health care 02/19/2015  . ANA positive 08/29/2014  . Bilateral leg edema 01/14/2014  . OCD (obsessive compulsive disorder) 12/26/2013  . Leg wound, left 03/23/2013  . Elevated antinuclear antibody (ANA) level 05/12/2011  . Hidradenitis 02/06/2011  . Acne 09/20/2010  . Weight gain 09/20/2010  . 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 Obesity 04/22/2010  . HYPERHIDROSIS 10/15/2009  . GENITAL HERPES 09/12/2009  . Anxiety and depression 06/18/2009  . Essential hypertension 06/18/2009    Past Medical History:  Diagnosis Date  . Asthma    childhood  . Elevated antinuclear antibody (ANA) level 05/12/2011  . Hypertension   . Overweight(278.02)     Family History  Problem Relation Age of Onset  . Arthritis Mother        osteoarthritis  . Diabetes Mother   . Hypertension Mother   . Mental illness Mother        schizophrenia  . Obesity Mother   . Schizophrenia Mother   . Hypertension Father   . Alcohol abuse Other   . Diabetes Other   . Hypertension Other   . Healthy Son   . Hypertension Sister    Past Surgical History:  Procedure Laterality Date  . LAPAROSCOPIC GASTRIC SLEEVE RESECTION  12/31/14  . 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 PFIZER(Purple Top)SARS-COV-2 Vaccination 11/21/2019, 12/19/2019  . PPD Test 04/05/2015  . Tdap 03/13/2013     Objective: Vital Signs: BP 107/74 (BP Location: Left Arm, Patient Position: Sitting, Cuff Size: Large)   Pulse 79   Resp 16   Ht 5\' 7"  (1.702 m)   Wt 250 lb 6.4 oz (113.6 kg)    BMI 39.22 kg/m    Physical Exam Vitals and nursing note reviewed.  Constitutional:      Appearance: She is well-developed and well-nourished.  HENT:     Head: Normocephalic and atraumatic.  Eyes:     Extraocular Movements: EOM normal.     Conjunctiva/sclera: Conjunctivae normal.  Cardiovascular:     Rate and Rhythm: Normal rate and regular rhythm.     Pulses: Intact distal pulses.     Heart sounds: Normal heart sounds.  Pulmonary:     Effort: Pulmonary effort is normal.     Breath sounds: Normal breath sounds.  Abdominal:     General: Bowel sounds are normal.     Palpations: Abdomen is soft.  Musculoskeletal:     Cervical back: Normal range of motion.  Lymphadenopathy:     Cervical: No cervical adenopathy.  Skin:    General: Skin is warm and dry.     Capillary Refill: Capillary refill takes less than 2 seconds.  Neurological:     Mental Status: She is alert and oriented to person, place, and time.  Psychiatric:        Mood and Affect: Mood and affect normal.        Behavior: Behavior normal.      Musculoskeletal Exam: C-spine thoracic and lumbar spine with good range of motion.  Shoulder joints, elbow joints, wrist joints, MCPs PIPs and DIPs with good range of motion with no synovitis.  Hip joints, knee joints, ankles, MTPs and PIPs with good range of motion with no synovitis.  CDAI Exam: CDAI Score: -- Patient Global: --; Provider Global: -- Swollen: --; Tender: -- Joint Exam 06/22/2020   No joint exam has been documented for this visit   There is currently no information documented on the homunculus. Go to the Rheumatology activity and complete the homunculus joint exam.  Investigation: No additional findings.  Imaging: No results found.  Recent Labs: Lab Results  Component Value Date   WBC 8.6 04/10/2020   HGB 13.1 04/10/2020   PLT 461 (H) 04/10/2020   NA 139 04/10/2020   K 3.9 04/10/2020   CL 102 04/10/2020   CO2 29 04/10/2020   GLUCOSE 80  04/10/2020   BUN 13 04/10/2020   CREATININE 1.05 04/10/2020   BILITOT 0.4 04/10/2020   ALKPHOS 55 12/08/2016   AST 10 04/10/2020   ALT 9 04/10/2020   PROT 7.0 04/10/2020   ALBUMIN 4.0 12/08/2016   CALCIUM 9.4 04/10/2020   GFRAA 76 04/10/2020    Speciality Comments: No specialty comments available.  Procedures:  No procedures performed Allergies: Fish oil, Peanut butter flavor, Peanuts [peanut oil], Apple, and Vilazodone hcl   Assessment / Plan:     Visit Diagnoses: Autoimmune disease (HCC) - AVISE positive 1,1: ANA 1:640H, rheumatoid factor IgM equivocal, +Antiphosphatidylserine/prothrombin IgM, dsDNA-, complements WNL, she was placed on Plaquenil due to ongoing fatigue, dry mouth, oral ulcers.  She has been on Plaquenil for a month now and has not noticed any improvement.  High risk medication use - Plaquenil 200 mg  1 tablet by mouth twice daily.  - Plan: CBC with Differential/Platelet, COMPLETE METABOLIC PANEL WITH GFR today, 3 months and then every 5 months.  She is scheduled to have an eye examination next month.  Fibromyalgia -she continues to have generalized pain and discomfort from fibromyalgia.  Need for regular exercise was emphasized.  I will refer her to integrative therapies.  Vitamin D deficiency-she has been on vitamin D supplement.  History of asthma  History of hypertension-her blood pressure was normal today.  History of hidradenitis suppurativa  History of obsessive compulsive disorder  Orders: Orders Placed This Encounter  Procedures  . CBC with Differential/Platelet  . COMPLETE METABOLIC PANEL WITH GFR  . Ambulatory referral to Physical Therapy   No orders of the defined types were placed in this encounter.    Follow-Up Instructions: Return in about 3 months (around 09/20/2020) for Autoimmune disease.   Pollyann Savoy, MD  Note - This record has been created using Animal nutritionist.  Chart creation errors have been sought, but may not always   have been located. Such creation errors do not reflect on  the standard of medical care.

## 2020-06-22 ENCOUNTER — Ambulatory Visit (INDEPENDENT_AMBULATORY_CARE_PROVIDER_SITE_OTHER): Payer: No Typology Code available for payment source | Admitting: Rheumatology

## 2020-06-22 ENCOUNTER — Other Ambulatory Visit: Payer: Self-pay

## 2020-06-22 ENCOUNTER — Encounter: Payer: Self-pay | Admitting: Rheumatology

## 2020-06-22 VITALS — BP 107/74 | HR 79 | Resp 16 | Ht 67.0 in | Wt 250.4 lb

## 2020-06-22 DIAGNOSIS — M797 Fibromyalgia: Secondary | ICD-10-CM | POA: Diagnosis not present

## 2020-06-22 DIAGNOSIS — Z79899 Other long term (current) drug therapy: Secondary | ICD-10-CM | POA: Diagnosis not present

## 2020-06-22 DIAGNOSIS — M359 Systemic involvement of connective tissue, unspecified: Secondary | ICD-10-CM

## 2020-06-22 DIAGNOSIS — Z8679 Personal history of other diseases of the circulatory system: Secondary | ICD-10-CM

## 2020-06-22 DIAGNOSIS — E559 Vitamin D deficiency, unspecified: Secondary | ICD-10-CM

## 2020-06-22 DIAGNOSIS — Z8709 Personal history of other diseases of the respiratory system: Secondary | ICD-10-CM

## 2020-06-22 DIAGNOSIS — Z872 Personal history of diseases of the skin and subcutaneous tissue: Secondary | ICD-10-CM

## 2020-06-22 DIAGNOSIS — Z8659 Personal history of other mental and behavioral disorders: Secondary | ICD-10-CM

## 2020-06-22 NOTE — Patient Instructions (Signed)
Standing Labs We placed an order today for your standing lab work.   Please have your standing labs drawn in April and every 5 months  If possible, please have your labs drawn 2 weeks prior to your appointment so that the provider can discuss your results at your appointment.  We have open lab daily Monday through Thursday from 8:30-12:30 PM and 1:30-4:30 PM and Friday from 8:30-12:30 PM and 1:30-4:00 PM at the office of Dr. Pollyann Savoy, [facility].   Please be advised, all patients with office appointments requiring lab work will take precedents over walk-in lab work.  If possible, please come for your lab work on Monday and Friday afternoons, as you may experience shorter wait times. The office is located at [address], [address], [place], [place] [address] No appointment is necessary.   Labs are drawn by [facility]. Please bring your co-pay at the time of your lab draw.  You may receive a bill from [facility] for your lab work.  If you wish to have your labs drawn at another location, please call the office 24 hours in advance to send orders.  If you have any questions regarding directions or hours of operation,  please call [phone].   As a reminder, please drink plenty of water prior to coming for your lab work. Thanks!

## 2020-06-23 LAB — COMPLETE METABOLIC PANEL WITH GFR
AG Ratio: 1.3 (calc) (ref 1.0–2.5)
ALT: 12 U/L (ref 6–29)
AST: 12 U/L (ref 10–30)
Albumin: 3.9 g/dL (ref 3.6–5.1)
Alkaline phosphatase (APISO): 52 U/L (ref 31–125)
BUN/Creatinine Ratio: 9 (calc) (ref 6–22)
BUN: 10 mg/dL (ref 7–25)
CO2: 30 mmol/L (ref 20–32)
Calcium: 9.3 mg/dL (ref 8.6–10.2)
Chloride: 99 mmol/L (ref 98–110)
Creat: 1.12 mg/dL — ABNORMAL HIGH (ref 0.50–1.10)
GFR, Est African American: 70 mL/min/{1.73_m2} (ref >=60)
GFR, Est Non African American: 61 mL/min/{1.73_m2} (ref >=60)
Globulin: 3.1 g/dL (calc) (ref 1.9–3.7)
Glucose, Bld: 82 mg/dL (ref 65–99)
Potassium: 3.9 mmol/L (ref 3.5–5.3)
Sodium: 138 mmol/L (ref 135–146)
Total Bilirubin: 0.5 mg/dL (ref 0.2–1.2)
Total Protein: 7 g/dL (ref 6.1–8.1)

## 2020-06-23 LAB — CBC WITH DIFFERENTIAL/PLATELET
Absolute Monocytes: 449 cells/uL (ref 200–950)
Basophils Absolute: 70 cells/uL (ref 0–200)
Basophils Relative: 0.8 %
Eosinophils Absolute: 79 cells/uL (ref 15–500)
Eosinophils Relative: 0.9 %
HCT: 38.5 % (ref 35.0–45.0)
Hemoglobin: 12.6 g/dL (ref 11.7–15.5)
Lymphs Abs: 1848 cells/uL (ref 850–3900)
MCH: 24.7 pg — ABNORMAL LOW (ref 27.0–33.0)
MCHC: 32.7 g/dL (ref 32.0–36.0)
MCV: 75.3 fL — ABNORMAL LOW (ref 80.0–100.0)
MPV: 11.3 fL (ref 7.5–12.5)
Monocytes Relative: 5.1 %
Neutro Abs: 6354 cells/uL (ref 1500–7800)
Neutrophils Relative %: 72.2 %
Platelets: 378 10*3/uL (ref 140–400)
RBC: 5.11 10*6/uL — ABNORMAL HIGH (ref 3.80–5.10)
RDW: 13.9 % (ref 11.0–15.0)
Total Lymphocyte: 21 %
WBC: 8.8 10*3/uL (ref 3.8–10.8)

## 2020-06-23 NOTE — Progress Notes (Signed)
MCV is low which indicates iron deficiency.  Please advise patient to take multivitamin with iron.  Creatinine is elevated most likely due to the use of ACE inhibitor.  Please forward labs to her PCP.  Please advise patient to avoid all NSAIDs.

## 2020-08-12 ENCOUNTER — Other Ambulatory Visit: Payer: Self-pay | Admitting: Physician Assistant

## 2020-08-12 DIAGNOSIS — M359 Systemic involvement of connective tissue, unspecified: Secondary | ICD-10-CM

## 2020-08-13 NOTE — Telephone Encounter (Signed)
Last Visit: 06/22/2020 Next Visit: 09/25/2020 Labs: 06/22/2020 MCV is low which indicates iron deficiency. Please advise patient to take multivitamin with iron. Creatinine is elevated most likely due to the use of ACE inhibitor.  Eye exam: no PLQ eye exam on file.   Current Dose per office note on 06/22/2020: Plaquenil 200 mg 1 tablet by mouth twice daily. AG:[id number] disease  Last Fill: 04/11/2020   I attempted to contact patient and left message on machine to advise patient we need PLQ eye exam ASAP.   Okay to refill Plaquenil?

## 2020-08-16 ENCOUNTER — Telehealth: Payer: Self-pay

## 2020-08-16 NOTE — Telephone Encounter (Signed)
Patient called stating she is scheduled for her Plaquenil eye exam on Monday, 08/27/20 at 9:30 [facility].

## 2020-08-24 ENCOUNTER — Emergency Department (HOSPITAL_BASED_OUTPATIENT_CLINIC_OR_DEPARTMENT_OTHER)
Admission: EM | Admit: 2020-08-24 | Discharge: 2020-08-24 | Disposition: A | Discharge status: Home / Self Care | Payer: No Typology Code available for payment source | Attending: Emergency Medicine | Admitting: Emergency Medicine

## 2020-08-24 ENCOUNTER — Telehealth: Payer: Self-pay

## 2020-08-24 ENCOUNTER — Encounter (HOSPITAL_BASED_OUTPATIENT_CLINIC_OR_DEPARTMENT_OTHER): Payer: Self-pay | Admitting: *Deleted

## 2020-08-24 ENCOUNTER — Other Ambulatory Visit: Payer: Self-pay

## 2020-08-24 ENCOUNTER — Emergency Department (HOSPITAL_BASED_OUTPATIENT_CLINIC_OR_DEPARTMENT_OTHER): Payer: No Typology Code available for payment source

## 2020-08-24 DIAGNOSIS — J45909 Unspecified asthma, uncomplicated: Secondary | ICD-10-CM | POA: Diagnosis not present

## 2020-08-24 DIAGNOSIS — Z91010 Allergy to peanuts: Secondary | ICD-10-CM | POA: Insufficient documentation

## 2020-08-24 DIAGNOSIS — R079 Chest pain, unspecified: Secondary | ICD-10-CM | POA: Insufficient documentation

## 2020-08-24 DIAGNOSIS — Z79899 Other long term (current) drug therapy: Secondary | ICD-10-CM | POA: Insufficient documentation

## 2020-08-24 DIAGNOSIS — I10 Essential (primary) hypertension: Secondary | ICD-10-CM | POA: Insufficient documentation

## 2020-08-24 DIAGNOSIS — R0602 Shortness of breath: Secondary | ICD-10-CM | POA: Diagnosis not present

## 2020-08-24 LAB — TROPONIN I (HIGH SENSITIVITY)
Troponin I (High Sensitivity): 2 ng/L (ref <18)
Troponin I (High Sensitivity): 3 ng/L (ref <18)

## 2020-08-24 LAB — BASIC METABOLIC PANEL
Anion gap: 9 (ref 5–15)
BUN: 10 mg/dL (ref 6–20)
CO2: 27 mmol/L (ref 22–32)
Calcium: 9 mg/dL (ref 8.9–10.3)
Chloride: 100 mmol/L (ref 98–111)
Creatinine, Ser: 1.1 mg/dL — ABNORMAL HIGH (ref 0.44–1.00)
GFR, Estimated: >60 mL/min (ref >60)
Glucose, Bld: 92 mg/dL (ref 70–99)
Potassium: 3.3 mmol/L — ABNORMAL LOW (ref 3.5–5.1)
Sodium: 136 mmol/L (ref 135–145)

## 2020-08-24 LAB — CBC
HCT: 38.3 % (ref 36.0–46.0)
Hemoglobin: 12.7 g/dL (ref 12.0–15.0)
MCH: 24.7 pg — ABNORMAL LOW (ref 26.0–34.0)
MCHC: 33.2 g/dL (ref 30.0–36.0)
MCV: 74.4 fL — ABNORMAL LOW (ref 80.0–100.0)
Platelets: 347 10*3/uL (ref 150–400)
RBC: 5.15 MIL/uL — ABNORMAL HIGH (ref 3.87–5.11)
RDW: 13.9 % (ref 11.5–15.5)
WBC: 8.8 10*3/uL (ref 4.0–10.5)
nRBC: 0 % (ref 0.0–0.2)

## 2020-08-24 LAB — D-DIMER, QUANTITATIVE: D-Dimer, Quant: 0.36 ug/mL-FEU (ref 0.00–0.50)

## 2020-08-24 LAB — PREGNANCY, URINE: Preg Test, Ur: NEGATIVE

## 2020-08-24 MED ORDER — POTASSIUM CHLORIDE CRYS ER 20 MEQ PO TBCR
40.0000 meq | EXTENDED_RELEASE_TABLET | Freq: Once | ORAL | Status: AC
  Administered 2020-08-24: 40 meq via ORAL
  Filled 2020-08-24: qty 2

## 2020-08-24 NOTE — Telephone Encounter (Signed)
I called patient, patient advised to call PCP or emergency room ASAP. Patient is not having chest pain at this time; however, she is short of breathe, patient verbalized understanding.

## 2020-08-24 NOTE — Discharge Instructions (Addendum)
Take over-the-counter medications as needed for pain.  Follow-up with your primary care doctor to be rechecked if the symptoms persist.  Return as needed for worsening symptoms.

## 2020-08-24 NOTE — ED Provider Notes (Signed)
Pt presented to the ED with complaints of chest pain.  Pt was initially seen by Dr Audley Hose.  Please see his note.  Plan was to check on delta troponin.  Initial labs otherwise unremarkable.  Troponin negative.  Doubt acs, pe.  Stable for discharge.   Linwood Dibbles, MD 08/24/20 [phone]

## 2020-08-24 NOTE — Telephone Encounter (Signed)
Patient called stating the last two nights has been experiencing chest pain.  Patient states last night it was a sharp pain that didn't last long.  Patient states she also experienced shortness of breath.  Patient states at her last appointment with Dr. Corliss Skains in January she mentioned the pains in her chest, but the last two nights they have increased.  Patient requested a return call.

## 2020-08-24 NOTE — ED Triage Notes (Signed)
Chest pain on and off for a year. Hx of lupus. Here with sob and chest pain since last night.

## 2020-08-27 ENCOUNTER — Encounter: Payer: Self-pay | Admitting: Rheumatology

## 2020-09-08 NOTE — ED Provider Notes (Signed)
[facility] EMERGENCY DEPARTMENT Provider Note   CSN: [id number] Arrival date & time: 08/24/20  1317     History Chief Complaint  Patient presents with  . Chest Pain  . Shortness of Breath    Whitney Harmon is a 45 y.o. female.  Patient presents with chief complaint of mid chest pain.  Describes it as sharp and persistent.  It has been persistent since today.  It is nonradiating, nothing appears to make it better or worse.  She has a history of lupus and high blood pressure.  Otherwise denies fevers or cough.  No vomiting or diarrhea.        Past Medical History:  Diagnosis Date  . Asthma    childhood  . Elevated antinuclear antibody (ANA) level 05/12/2011  . Hypertension   . Overweight(278.02)     Patient Active Problem List   Diagnosis Date Noted  . Fibromyalgia 12/06/2016  . Vitamin D deficiency 12/06/2016  . History of asthma 12/06/2016  . Preventative health care 02/19/2015  . ANA positive 08/29/2014  . Bilateral leg edema 01/14/2014  . OCD (obsessive compulsive disorder) 12/26/2013  . Leg wound, left 03/23/2013  . Elevated antinuclear antibody (ANA) level 05/12/2011  . Hidradenitis 02/06/2011  . Acne 09/20/2010  . Weight gain 09/20/2010  . 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 Obesity 04/22/2010  . HYPERHIDROSIS 10/15/2009  . GENITAL HERPES 09/12/2009  . Anxiety and depression 06/18/2009  . Essential hypertension 06/18/2009    Past Surgical History:  Procedure Laterality Date  . LAPAROSCOPIC GASTRIC SLEEVE RESECTION  12/31/14  . SALPINGOOPHORECTOMY  2007   left     OB History   No obstetric history on file.     Family History  Problem Relation Age of Onset  . Arthritis Mother        osteoarthritis  . Diabetes Mother   . Hypertension Mother   . Mental illness Mother        schizophrenia  . Obesity Mother   . Schizophrenia Mother   . Hypertension Father   . Alcohol abuse Other   . Diabetes Other   . Hypertension Other   .  Healthy Son   . Hypertension Sister     Social History   Tobacco Use  . Smoking status: Never Smoker  . Smokeless tobacco: Never Used  Vaping Use  . Vaping Use: Never used  Substance Use Topics  . Alcohol use: Yes    Alcohol/week: 1.0 standard drink    Types: 1 Glasses of wine per week    Comment: Social  . Drug use: No    Home Medications Prior to Admission medications   Medication Sig Start Date End Date Taking? Authorizing Provider  ALPRAZolam (XANAX) 0.25 MG tablet TAKE ONE TABLET BY MOUTH THREE TIMES DAILY AS NEEDED FOR ANXIETY OR SLEEP   Yes [name], NP  amLODipine (NORVASC) 10 MG tablet Take 1 tablet (10 mg total) by mouth daily. 07/04/16  Yes [name], NP  buPROPion (WELLBUTRIN XL) 150 MG 24 hr tablet Take 3 tablets by mouth daily. 09/28/15  Yes [provider]  chlorthalidone (HYGROTON) 25 MG tablet chlorthalidone 25 mg tablet  TAKE 1 2 (ONE HALF) TABLET BY MOUTH ONCE DAILY 09/22/19  Yes [provider]  cloNIDine (CATAPRES) 0.1 MG tablet TAKE ONE TABLET BY MOUTH TWICE DAILY 02/04/16  Yes [name], NP  FLUoxetine (PROZAC) 20 MG/5ML solution Take 15 mLs (60 mg total) by mouth daily. 01/10/15  Yes [name], NP  hydroxychloroquine (PLAQUENIL) 200 MG tablet Take 1 tablet by mouth twice daily 08/13/20  Yes Gearldine Bienenstock, PA-C  LARIN FE 1/20 1-20 MG-MCG tablet Take 1 tablet by mouth daily. 02/14/20  Yes [provider]  lisinopril (ZESTRIL) 20 MG tablet Take 20 mg by mouth daily.  03/04/20  Yes [provider]  montelukast (SINGULAIR) 10 MG tablet Take 10 mg by mouth at bedtime. 01/11/20  Yes [provider]  EPINEPHrine (EPIPEN IJ) Inject as directed as needed.    [provider]  loratadine (CLARITIN) 10 MG tablet Take 10 mg by mouth daily as needed.     [provider]  mupirocin ointment (BACTROBAN) 2 % Apply topically 3 (three) times daily as needed. 02/23/20   [provider]  VYVANSE 50 MG capsule Take 50 mg by mouth daily as needed. 01/14/20   [provider]    Allergies    Fish oil, Peanut butter flavor, Peanuts [peanut oil], Apple, and Vilazodone hcl  Review of Systems   Review of Systems  Constitutional: Negative for fever.  HENT: Negative for ear pain.   Eyes: Negative for pain.  Respiratory: Negative for cough.   Cardiovascular: Positive for chest pain.  Gastrointestinal: Negative for abdominal pain.  Genitourinary: Negative for flank pain.  Musculoskeletal: Negative for back pain.  Skin: Negative for rash.  Neurological: Negative for headaches.    Physical Exam Updated Vital Signs BP 106/60 (BP Location: Left Arm)   Pulse 70   Temp 98.6 F (37 C) (Oral)   Resp 16   Ht 5\' 7"  (1.702 m)   Wt 112.8 kg   SpO2 100%   BMI 38.95 kg/m   Physical Exam Constitutional:      General: She is not in acute distress.    Appearance: Normal appearance.  HENT:     Head: Normocephalic.     Nose: Nose normal.  Eyes:     Extraocular Movements: Extraocular movements intact.  Cardiovascular:     Rate and Rhythm: Normal rate.  Pulmonary:     Effort: Pulmonary effort is normal.  Musculoskeletal:        General: Normal range of motion.     Cervical back: Normal range of motion.  Neurological:     General: No focal deficit present.     Mental Status: She is alert. Mental status is at baseline.     ED Results / Procedures / Treatments   Labs (all labs ordered are listed, but only abnormal results are displayed) Labs Reviewed  BASIC METABOLIC PANEL - Abnormal; Notable for the following components:      Result Value   Potassium 3.3 (*)    Creatinine, Ser 1.10 (*)    All other components within normal limits  CBC - Abnormal; Notable for the following components:   RBC 5.15 (*)    MCV 74.4 (*)    MCH 24.7 (*)    All other components within normal limits  PREGNANCY, URINE  D-DIMER, QUANTITATIVE  TROPONIN I (HIGH  SENSITIVITY)  TROPONIN I (HIGH SENSITIVITY)    EKG EKG Interpretation  Date/Time:  Friday August 24 2020 13:35:51 EDT Ventricular Rate:  79 PR Interval:  172 QRS Duration: 99 QT Interval:  375 QTC Calculation: 430 R Axis:   84 Text Interpretation: Sinus rhythm Confirmed by 07-16-1974 ([id number]) on 08/24/2020 1:45:44 PM Also confirmed by 10/24/2020 ([id number]), editor Glencoe, LaVerne ([place])  on 08/25/2020 8:46:55 AM   Radiology No results found.  Procedures Procedures  Medications Ordered in ED Medications  potassium chloride SA (KLOR-CON) CR tablet 40 mEq (40 mEq Oral Given 08/24/20 1446)    ED Course  I have reviewed the triage vital signs and the nursing notes.  Pertinent labs & imaging results that were available during my care of the patient were reviewed by me and considered in my medical decision making (see chart for details).    MDM Rules/Calculators/A&P                          EKG was unremarkable no ST elevations depressions noted.  Vital signs within normal limits.  Initial troponin is negative, pending second troponin.  Given presentation here today I doubt acute coronary syndrome or pulmonary embolism.  Will be signed out to oncoming provider.  Anticipating negative second troponin and ultimately discharged home.   Final Clinical Impression(s) / ED Diagnoses Final diagnoses:  Chest pain, unspecified type    Rx / DC Orders ED Discharge Orders    None       Cheryll Cockayne, MD 09/08/20 [phone]

## 2020-09-11 NOTE — Progress Notes (Deleted)
Office Visit Note  Patient: Whitney Harmon             Date of Birth: 1975-06-23           MRN: [id number]             PCP: Bryon Lions PA-C Referring: Bryon Lions, PA-C Visit Date: 09/25/2020 Occupation: @GUAROCC @  Subjective:  No chief complaint on file.   History of Present Illness: Whitney Harmon is a 45 y.o. female ***   Activities of Daily Living:  Patient reports morning stiffness for *** {minute/hour:19697}.   Patient {ACTIONS;DENIES/REPORTS:21021675::"Denies"} nocturnal pain.  Difficulty dressing/grooming: {ACTIONS;DENIES/REPORTS:21021675::"Denies"} Difficulty climbing stairs: {ACTIONS;DENIES/REPORTS:21021675::"Denies"} Difficulty getting out of chair: {ACTIONS;DENIES/REPORTS:21021675::"Denies"} Difficulty using hands for taps, buttons, cutlery, and/or writing: {ACTIONS;DENIES/REPORTS:21021675::"Denies"}  No Rheumatology ROS completed.   PMFS History:  Patient Active Problem List   Diagnosis Date Noted  . Fibromyalgia 12/06/2016  . Vitamin D deficiency 12/06/2016  . History of asthma 12/06/2016  . Preventative health care 02/19/2015  . ANA positive 08/29/2014  . Bilateral leg edema 01/14/2014  . OCD (obsessive compulsive disorder) 12/26/2013  . Leg wound, left 03/23/2013  . Elevated antinuclear antibody (ANA) level 05/12/2011  . Hidradenitis 02/06/2011  . Acne 09/20/2010  . Weight gain 09/20/2010  . 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 Obesity 04/22/2010  . HYPERHIDROSIS 10/15/2009  . GENITAL HERPES 09/12/2009  . Anxiety and depression 06/18/2009  . Essential hypertension 06/18/2009    Past Medical History:  Diagnosis Date  . Asthma    childhood  . Elevated antinuclear antibody (ANA) level 05/12/2011  . Hypertension   . Overweight(278.02)     Family History  Problem Relation Age of Onset  . Arthritis Mother        osteoarthritis  . Diabetes Mother   . Hypertension Mother   . Mental illness Mother        schizophrenia  . Obesity  Mother   . Schizophrenia Mother   . Hypertension Father   . Alcohol abuse Other   . Diabetes Other   . Hypertension Other   . Healthy Son   . Hypertension Sister    Past Surgical History:  Procedure Laterality Date  . LAPAROSCOPIC GASTRIC SLEEVE RESECTION  12/31/14  . 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 PFIZER(Purple Top)SARS-COV-2 Vaccination 11/21/2019, 12/19/2019  . PPD Test 04/05/2015  . Tdap 03/13/2013     Objective: Vital Signs: There were no vitals taken for this visit.   Physical Exam   Musculoskeletal Exam: ***  CDAI Exam: CDAI Score: -- Patient Global: --; Provider Global: -- Swollen: --; Tender: -- Joint Exam 09/25/2020   No joint exam has been documented for this visit   There is currently no information documented on the homunculus. Go to the Rheumatology activity and complete the homunculus joint exam.  Investigation: No additional findings.  Imaging: DG Chest 2 View  Result Date: 08/24/2020 CLINICAL DATA:  Chest pain. EXAM: CHEST - 2 VIEW COMPARISON:  None. FINDINGS: The heart size and mediastinal contours are within normal limits. Both lungs are clear. No pneumothorax or pleural effusion is noted. The visualized skeletal structures are unremarkable. IMPRESSION: No active cardiopulmonary disease. Electronically Signed   By: 10/24/2020 M.D.   On: 08/24/2020 14:13    Recent Labs: Lab Results  Component Value Date   WBC 8.8 08/24/2020  HGB 12.7 08/24/2020   PLT 347 08/24/2020   NA 136 08/24/2020   K 3.3 (L) 08/24/2020   CL 100 08/24/2020   CO2 27 08/24/2020   GLUCOSE 92 08/24/2020   BUN 10 08/24/2020   CREATININE 1.10 (H) 08/24/2020   BILITOT 0.5 06/22/2020   ALKPHOS 55 12/08/2016   AST 12 06/22/2020   ALT 12 06/22/2020   PROT 7.0 06/22/2020   ALBUMIN 4.0 12/08/2016   CALCIUM 9.0 08/24/2020   GFRAA  70 06/22/2020    Speciality Comments: Patient called, Appt PLQ Eye Exam 08/27/2020 [facility]  Procedures:  No procedures performed Allergies: Fish oil, Peanut butter flavor, Peanuts [peanut oil], Apple, and Vilazodone hcl   Assessment / Plan:     Visit Diagnoses: No diagnosis found.  Orders: No orders of the defined types were placed in this encounter.  No orders of the defined types were placed in this encounter.   Face-to-face time spent with patient was *** minutes. Greater than 50% of time was spent in counseling and coordination of care.  Follow-Up Instructions: No follow-ups on file.   Ellen Henri, CMA  Note - This record has been created using Animal nutritionist.  Chart creation errors have been sought, but may not always  have been located. Such creation errors do not reflect on  the standard of medical care.

## 2020-09-21 ENCOUNTER — Other Ambulatory Visit: Payer: Self-pay | Admitting: Physician Assistant

## 2020-09-21 DIAGNOSIS — M359 Systemic involvement of connective tissue, unspecified: Secondary | ICD-10-CM

## 2020-09-21 NOTE — Telephone Encounter (Signed)
Last Visit: 06/22/2020 Next Visit: 11/13/2020 Labs: 08/02/2020, Creatinine 1.04, MCV 75, MCH 25.1  Eye exam: 08/27/2020  Current Dose per office note 06/22/2020, Plaquenil 200 mg 1 tablet by mouth twice daily.  DX:  Autoimmune disease   Last Fill: 08/13/2020  Okay to refill Plaquenil?

## 2020-09-25 ENCOUNTER — Ambulatory Visit: Payer: No Typology Code available for payment source | Admitting: Rheumatology

## 2020-09-25 DIAGNOSIS — M797 Fibromyalgia: Secondary | ICD-10-CM

## 2020-09-25 DIAGNOSIS — Z8709 Personal history of other diseases of the respiratory system: Secondary | ICD-10-CM

## 2020-09-25 DIAGNOSIS — Z872 Personal history of diseases of the skin and subcutaneous tissue: Secondary | ICD-10-CM

## 2020-09-25 DIAGNOSIS — Z8659 Personal history of other mental and behavioral disorders: Secondary | ICD-10-CM

## 2020-09-25 DIAGNOSIS — Z79899 Other long term (current) drug therapy: Secondary | ICD-10-CM

## 2020-09-25 DIAGNOSIS — M359 Systemic involvement of connective tissue, unspecified: Secondary | ICD-10-CM

## 2020-09-25 DIAGNOSIS — E559 Vitamin D deficiency, unspecified: Secondary | ICD-10-CM

## 2020-09-25 DIAGNOSIS — Z8679 Personal history of other diseases of the circulatory system: Secondary | ICD-10-CM

## 2020-10-25 ENCOUNTER — Telehealth: Payer: Self-pay | Admitting: *Deleted

## 2020-10-25 NOTE — Telephone Encounter (Signed)
Patient states she is having increased fatigue that has been getting worse. Patient wanted to know what you think about IV therapy for fatigue. Patient states the infusion included Vitamin B 12, Zinc, Vitamin B complex and Vitamin C, magnesium. Please advise.

## 2020-10-25 NOTE — Telephone Encounter (Signed)
Left message to advise patient okay to do IV therapy for vitamins. Advised patient  that there are some centers in [place] who to IV infusions for vitamins.  Dr Corliss Skains does not know of any sites in [place].

## 2020-10-25 NOTE — Telephone Encounter (Signed)
Please let patient know that there are some centers in [place] who to IV infusions for vitamins.  I do not know of any sites in [place].

## 2020-10-30 NOTE — Progress Notes (Signed)
Office Visit Note  Patient: Whitney Harmon             Date of Birth: [birth year]           MRN: [id number]             PCP: Rosemary Holms Referring: Bryon Lions, PA-C Visit Date: 11/13/2020 Occupation: @GUAROCC @  Subjective:  Fatigue.   History of Present Illness: RITAL Harmon is a 45 y.o. female history of autoimmune disease and fibromyalgia syndrome.  She continues to have a lot of fatigue.  She has noticed improvement in her symptoms.  She still continues to have occasional sores in her mouth.  She continues to have pain and discomfort in her joints.  She denies any history of sicca symptoms, Raynaud's phenomenon, photosensitivity, lymphadenopathy.  She had recent evaluation in the emergency room for chest pain which was negative.  She states her fatigue has been more prominent recently to the point that she had to miss work 1 day to sleep.  Activities of Daily Living:  Patient reports morning stiffness for 10-15 minutes.   Patient Reports nocturnal pain.  Difficulty dressing/grooming: Denies Difficulty climbing stairs: Reports Difficulty getting out of chair: Reports Difficulty using hands for taps, buttons, cutlery, and/or writing: Denies  Review of Systems  Constitutional:  Positive for fatigue.  HENT:  Positive for mouth sores. Negative for mouth dryness and nose dryness.   Eyes:  Negative for pain, itching and dryness.  Respiratory:  Negative for shortness of breath and difficulty breathing.   Cardiovascular:  Negative for chest pain and palpitations.  Gastrointestinal:  Negative for blood in stool, constipation and diarrhea.  Endocrine: Negative for increased urination.  Genitourinary:  Negative for difficulty urinating.  Musculoskeletal:  Positive for joint pain, joint pain and morning stiffness. Negative for joint swelling, myalgias, muscle tenderness and myalgias.  Skin:  Negative for color change, rash and redness.  Allergic/Immunologic: Negative  for susceptible to infections.  Neurological:  Positive for dizziness, headaches and memory loss. Negative for numbness.  Hematological:  Positive for bruising/bleeding tendency.  Psychiatric/Behavioral:  Negative for confusion.    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   Obesity 04/22/2010   HYPERHIDROSIS 10/15/2009   GENITAL HERPES 09/12/2009   Anxiety and depression 06/18/2009   Essential hypertension 06/18/2009    Past Medical History:  Diagnosis Date   Asthma    childhood   Elevated antinuclear antibody (ANA) level 05/12/2011   Hypertension    Overweight(278.02)     Family History  Problem Relation Age of Onset   Arthritis Mother        osteoarthritis   Diabetes Mother    Hypertension Mother    Mental illness Mother        schizophrenia   Obesity Mother    Schizophrenia Mother    Hypertension Father    Alcohol abuse Other    Diabetes Other    Hypertension Other    Healthy Son    Hypertension Sister    Past Surgical History:  Procedure Laterality Date   LAPAROSCOPIC GASTRIC SLEEVE RESECTION  12/31/14   SALPINGOOPHORECTOMY  2007   left   Social History   Social History Narrative   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  Constitutional:      Appearance: She is well-developed.  HENT:     Head: Normocephalic and atraumatic.  Eyes:     Conjunctiva/sclera: Conjunctivae normal.  Cardiovascular:     Rate and Rhythm: Normal rate and regular rhythm.     Heart sounds: Normal heart sounds.  Pulmonary:     Effort: Pulmonary effort is normal.     Breath sounds: Normal breath sounds.  Abdominal:     General: Bowel sounds are normal.     Palpations: Abdomen is soft.  Musculoskeletal:     Cervical back: Normal range of motion.  Lymphadenopathy:     Cervical: No cervical adenopathy.  Skin:    General: Skin is warm and dry.     Capillary Refill: Capillary refill takes less than 2 seconds.  Neurological:     Mental Status: She is alert and oriented to person, place, and time.  Psychiatric:        Behavior: Behavior normal.     Musculoskeletal Exam: C-spine thoracic and lumbar spine with good range of motion.  Shoulder joints, elbow joints, wrist joints, MCPs PIPs and DIPs with good range of motion with no synovitis.  Hip joints, knee joints, ankles, MTPs and PIPs with good range of motion with no synovitis.  CDAI Exam: CDAI Score: -- Patient Global: --; Provider Global: -- Swollen: --; Tender: -- Joint Exam 11/13/2020   No joint exam has been documented for this visit   There is currently no information documented on the homunculus. Go to the Rheumatology activity and complete the homunculus joint exam.  Investigation: No additional findings.  Imaging: No results found.  Recent Labs: Lab Results  Component Value Date   WBC 8.8 08/24/2020   HGB 12.7 08/24/2020   PLT 347 08/24/2020   NA 136 08/24/2020   K 3.3 (L) 08/24/2020   CL 100 08/24/2020   CO2 27 08/24/2020   GLUCOSE 92 08/24/2020   BUN 10 08/24/2020   CREATININE 1.10 (H) 08/24/2020   BILITOT 0.5 06/22/2020   ALKPHOS 55  12/08/2016   AST 12 06/22/2020   ALT 12 06/22/2020   PROT 7.0 06/22/2020   ALBUMIN 4.0 12/08/2016   CALCIUM 9.0 08/24/2020   GFRAA 70 06/22/2020    Speciality Comments: PLQ eye exam: 08/27/2020 normal. [facility]- Dr. Genia Del. Follow up in 1 year.  Procedures:  No procedures performed Allergies: Fish oil, Peanut butter flavor, Peanuts [peanut oil], Apple, Other, and Vilazodone hcl   Assessment / Plan:     Visit Diagnoses: Autoimmune disease (HCC) - AVISE positive 1,1: ANA 1:640H, rheumatoid factor IgM equivocal, +Antiphosphatidylserine/prothrombin IgM, dsDNA-, complements WNL -patient is clinically doing well.  She had no synovitis on my examination.  She continues to have fatigue and sores in her mouth.  She denies any history of  Raynaud's phenomenon, photosensitivity, malar rash or inflammatory arthritis.  I will obtain labs today.  Plan: Anti-DNA antibody, double-stranded, C3 and C4, Sedimentation rate, Urinalysis, Routine w reflex microscopic, Lupus Anticoagulant Eval w/Reflex we will contact her once lab results are available.  Her labs have been stable.  High risk medication use - Plaquenil 200 mg 1 tablet by mouth twice daily.  - - Plan: CBC with Differential/Platelet, COMPLETE METABOLIC PANEL WITH GFR  Fibromyalgia-she continues to have some generalized pain.  Chronic fatigue -she gives history of increased somnolence.  She is experiencing increased fatigue.  I will refer her to neurology for sleep study.  Plan: Ambulatory referral to Neurology  Vitamin D deficiency-use of vitamin D supplement was emphasized.  History of hypertension-her blood pressure is normal today.  History of asthma  History of obsessive compulsive disorder  History of hidradenitis suppurativa-patient states she will be getting surgery at [facility].  Orders: Orders Placed This Encounter  Procedures   CBC with Differential/Platelet   COMPLETE METABOLIC PANEL WITH GFR    Anti-DNA antibody, double-stranded   C3 and C4   Sedimentation rate   Urinalysis, Routine w reflex microscopic   Lupus Anticoagulant Eval w/Reflex   Ambulatory referral to Neurology    No orders of the defined types were placed in this encounter.  .  Follow-Up Instructions: Return in about 5 months (around 04/15/2021) for Autoimmune disease.   Pollyann Savoy, MD  Note - This record has been created using Animal nutritionist.  Chart creation errors have been sought, but may not always  have been located. Such creation errors do not reflect on  the standard of medical care.

## 2020-11-13 ENCOUNTER — Other Ambulatory Visit: Payer: Self-pay

## 2020-11-13 ENCOUNTER — Encounter: Payer: Self-pay | Admitting: Rheumatology

## 2020-11-13 ENCOUNTER — Ambulatory Visit (INDEPENDENT_AMBULATORY_CARE_PROVIDER_SITE_OTHER): Payer: No Typology Code available for payment source | Admitting: Rheumatology

## 2020-11-13 VITALS — BP 107/73 | HR 82 | Resp 15 | Ht 67.0 in | Wt 231.0 lb

## 2020-11-13 DIAGNOSIS — E559 Vitamin D deficiency, unspecified: Secondary | ICD-10-CM

## 2020-11-13 DIAGNOSIS — M797 Fibromyalgia: Secondary | ICD-10-CM | POA: Diagnosis not present

## 2020-11-13 DIAGNOSIS — R5382 Chronic fatigue, unspecified: Secondary | ICD-10-CM | POA: Diagnosis not present

## 2020-11-13 DIAGNOSIS — Z79899 Other long term (current) drug therapy: Secondary | ICD-10-CM

## 2020-11-13 DIAGNOSIS — Z872 Personal history of diseases of the skin and subcutaneous tissue: Secondary | ICD-10-CM

## 2020-11-13 DIAGNOSIS — M359 Systemic involvement of connective tissue, unspecified: Secondary | ICD-10-CM | POA: Diagnosis not present

## 2020-11-13 DIAGNOSIS — Z8709 Personal history of other diseases of the respiratory system: Secondary | ICD-10-CM

## 2020-11-13 DIAGNOSIS — Z8679 Personal history of other diseases of the circulatory system: Secondary | ICD-10-CM

## 2020-11-13 DIAGNOSIS — Z8659 Personal history of other mental and behavioral disorders: Secondary | ICD-10-CM

## 2020-11-17 LAB — CBC WITH DIFFERENTIAL/PLATELET
Absolute Monocytes: 585 cells/uL (ref 200–950)
Basophils Absolute: 59 cells/uL (ref 0–200)
Basophils Relative: 0.5 %
Eosinophils Absolute: 70 cells/uL (ref 15–500)
Eosinophils Relative: 0.6 %
HCT: 39.3 % (ref 35.0–45.0)
Hemoglobin: 12.7 g/dL (ref 11.7–15.5)
Lymphs Abs: 2165 cells/uL (ref 850–3900)
MCH: 24.8 pg — ABNORMAL LOW (ref 27.0–33.0)
MCHC: 32.3 g/dL (ref 32.0–36.0)
MCV: 76.8 fL — ABNORMAL LOW (ref 80.0–100.0)
MPV: 10.8 fL (ref 7.5–12.5)
Monocytes Relative: 5 %
Neutro Abs: 8822 cells/uL — ABNORMAL HIGH (ref 1500–7800)
Neutrophils Relative %: 75.4 %
Platelets: 411 10*3/uL — ABNORMAL HIGH (ref 140–400)
RBC: 5.12 10*6/uL — ABNORMAL HIGH (ref 3.80–5.10)
RDW: 13.4 % (ref 11.0–15.0)
Total Lymphocyte: 18.5 %
WBC: 11.7 10*3/uL — ABNORMAL HIGH (ref 3.8–10.8)

## 2020-11-17 LAB — COMPLETE METABOLIC PANEL WITH GFR
AG Ratio: 1.2 (calc) (ref 1.0–2.5)
ALT: 8 U/L (ref 6–29)
AST: 10 U/L (ref 10–30)
Albumin: 3.9 g/dL (ref 3.6–5.1)
Alkaline phosphatase (APISO): 55 U/L (ref 31–125)
BUN/Creatinine Ratio: 13 (calc) (ref 6–22)
BUN: 15 mg/dL (ref 7–25)
CO2: 26 mmol/L (ref 20–32)
Calcium: 9.4 mg/dL (ref 8.6–10.2)
Chloride: 100 mmol/L (ref 98–110)
Creat: 1.16 mg/dL — ABNORMAL HIGH (ref 0.50–1.10)
GFR, Est African American: 67 mL/min/{1.73_m2} (ref >=60)
GFR, Est Non African American: 58 mL/min/{1.73_m2} — ABNORMAL LOW (ref >=60)
Globulin: 3.3 g/dL (calc) (ref 1.9–3.7)
Glucose, Bld: 84 mg/dL (ref 65–99)
Potassium: 4.3 mmol/L (ref 3.5–5.3)
Sodium: 135 mmol/L (ref 135–146)
Total Bilirubin: 0.6 mg/dL (ref 0.2–1.2)
Total Protein: 7.2 g/dL (ref 6.1–8.1)

## 2020-11-17 LAB — C3 AND C4
C3 Complement: 141 mg/dL (ref 83–193)
C4 Complement: 41 mg/dL (ref 15–57)

## 2020-11-17 LAB — LUPUS ANTICOAGULANT EVAL W/ REFLEX
PTT-LA Screen: 41 s — ABNORMAL HIGH (ref <=40)
dRVVT: 39 s (ref <=45)

## 2020-11-17 LAB — SEDIMENTATION RATE: Sed Rate: 11 mm/h (ref 0–20)

## 2020-11-17 LAB — RFLX HEXAGONAL PHASE CONFIRM: Hexagonal Phase Conf: NEGATIVE

## 2020-11-17 LAB — ANTI-DNA ANTIBODY, DOUBLE-STRANDED: ds DNA Ab: <1 IU/mL

## 2020-11-18 NOTE — Progress Notes (Signed)
WBC and Platelets are mildly elevated, not of concern. ESR, C3, C4 and dsDNA are within normal limits. LA is negative. Labs do not indicate an active disease. Creatinine is elevated, most likely due to the use of Spironolactone and Lisinopril.Please forward results to her PCP.

## 2020-11-27 ENCOUNTER — Telehealth: Payer: Self-pay

## 2020-11-27 NOTE — Telephone Encounter (Signed)
Noted  

## 2020-11-27 NOTE — Telephone Encounter (Signed)
Patient called stating her employer faxed the office requesting additional information for her FMLA paperwork.  Patient states they no longer need that information.

## 2021-01-24 ENCOUNTER — Institutional Professional Consult (permissible substitution): Payer: No Typology Code available for payment source | Admitting: Neurology

## 2021-02-01 ENCOUNTER — Other Ambulatory Visit: Payer: Self-pay | Admitting: Rheumatology

## 2021-02-01 DIAGNOSIS — M359 Systemic involvement of connective tissue, unspecified: Secondary | ICD-10-CM

## 2021-02-01 NOTE — Telephone Encounter (Signed)
Next Visit: 04/09/2021  Last Visit: 11/13/2020  Labs: 01/02/2021 Creat. 1.28, GFR 54, AST 8, RBC 5.25, MCV 74.5, MCH 24.3, MCHC 32.7  Eye exam: 08/27/2020 normal.   Current Dose per office note 11/13/2020: Plaquenil 200 mg 1 tablet by mouth twice daily  WP:[id number] disease   Last Fill: 09/21/2020  Okay to refill Plaquenil?

## 2021-03-26 NOTE — Progress Notes (Deleted)
Office Visit Note  Patient: Whitney Harmon             Date of Birth: 03/11/1976           MRN: [id number]             PCP: Bryon LionsMoreira, Niall A, PA-C Referring: Bryon LionsMoreira, Niall A, PA-C Visit Date: 04/09/2021 Occupation: @GUAROCC @  Subjective:    History of Present Illness: Whitney Harmon is a [age] y.o. female with history of autoimmune disease and fibromyalgia.  She is taking plaquenil 200 mg 1 tablet by mouth twice daily.   CBC and CMP updated on 01/02/21.  PLQ eye exam: 08/27/2020 normal. [facility]- Dr. Genia DelMincey. Follow up in 1 year.    Activities of Daily Living:  Patient reports morning stiffness for *** {minute/hour:19697}.   Patient {ACTIONS;DENIES/REPORTS:21021675::"Denies"} nocturnal pain.  Difficulty dressing/grooming: {ACTIONS;DENIES/REPORTS:21021675::"Denies"} Difficulty climbing stairs: {ACTIONS;DENIES/REPORTS:21021675::"Denies"} Difficulty getting out of chair: {ACTIONS;DENIES/REPORTS:21021675::"Denies"} Difficulty using hands for taps, buttons, cutlery, and/or writing: {ACTIONS;DENIES/REPORTS:21021675::"Denies"}  No Rheumatology ROS completed.   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   Obesity 04/22/2010   HYPERHIDROSIS 10/15/2009   GENITAL HERPES 09/12/2009   Anxiety and depression 06/18/2009   Essential hypertension 06/18/2009    Past Medical History:  Diagnosis Date   Asthma    childhood   Elevated antinuclear antibody (ANA) level 05/12/2011   Hypertension    Overweight(278.02)     Family  History  Problem Relation Age of Onset   Arthritis Mother        osteoarthritis   Diabetes Mother    Hypertension Mother    Mental illness Mother        schizophrenia   Obesity Mother    Schizophrenia Mother    Hypertension Father    Alcohol abuse Other    Diabetes Other    Hypertension Other    Healthy Son    Hypertension Sister    Past Surgical History:  Procedure Laterality Date   LAPAROSCOPIC GASTRIC SLEEVE RESECTION  12/31/14   SALPINGOOPHORECTOMY  2007   left   Social History   Social History Narrative   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   Physical Exam Vitals and nursing note reviewed.  Constitutional:      Appearance: She is well-developed.  HENT:     Head: Normocephalic and atraumatic.  Eyes:     Conjunctiva/sclera: Conjunctivae normal.  Cardiovascular:     Rate and Rhythm: Normal rate and regular rhythm.     Heart sounds: Normal heart sounds.  Pulmonary:     Effort: Pulmonary effort is normal.     Breath sounds: Normal breath sounds.  Abdominal:     General: Bowel sounds are normal.     Palpations: Abdomen is soft.  Musculoskeletal:     Cervical back: Normal range of motion.  Lymphadenopathy:     Cervical: No cervical adenopathy.  Skin:    General: Skin is warm and dry.     Capillary Refill: Capillary refill takes less than 2 seconds.  Neurological:     Mental Status: She is alert and oriented to person, place, and time.  Psychiatric:        Behavior: Behavior normal.     Musculoskeletal Exam: ***  CDAI Exam: CDAI Score: -- Patient Global: --; Provider Global: -- Swollen: --; Tender: -- Joint Exam 04/09/2021   No joint exam has been documented for this visit   There is currently no information documented on  the homunculus. Go to the Rheumatology activity and complete the homunculus joint exam.  Investigation: No additional findings.  Imaging: No results found.  Recent Labs: Lab Results  Component Value Date   WBC 11.7 (H) 11/13/2020   HGB 12.7 11/13/2020   PLT 411 (H) 11/13/2020   NA 135 11/13/2020   K 4.3 11/13/2020   CL 100 11/13/2020   CO2 26 11/13/2020   GLUCOSE 84 11/13/2020   BUN 15 11/13/2020   CREATININE 1.16 (H) 11/13/2020   BILITOT 0.6 11/13/2020   ALKPHOS 55 12/08/2016   AST 10 11/13/2020   ALT 8 11/13/2020   PROT 7.2 11/13/2020   ALBUMIN 4.0 12/08/2016   CALCIUM 9.4 11/13/2020   GFRAA 67 11/13/2020    Speciality Comments: PLQ eye exam: 08/27/2020 normal. [facility]- Dr. Genia Del. Follow up in 1 year.  Procedures:  No procedures performed Allergies: Fish oil, Peanut butter flavor, Peanuts [peanut oil], Apple, Other, and Vilazodone hcl   Assessment / Plan:     Visit Diagnoses: Autoimmune disease (HCC)  High risk medication use  Fibromyalgia  Chronic fatigue  Vitamin D deficiency  History of hypertension  History of asthma  History of obsessive compulsive disorder  History of hidradenitis suppurativa  Orders: No orders of the defined types were placed in this encounter.  No orders of the defined types were placed in this encounter.   Face-to-face time spent with patient was *** minutes. Greater than 50% of time was spent in counseling and coordination of care.  Follow-Up Instructions: No follow-ups on file.   Gearldine Bienenstock, PA-C  Note - This record has been created using Dragon software.  Chart creation errors have been sought, but may not always  have been located. Such creation errors do not reflect on  the standard of medical care.

## 2021-04-03 ENCOUNTER — Encounter: Payer: Self-pay | Admitting: Neurology

## 2021-04-03 ENCOUNTER — Institutional Professional Consult (permissible substitution): Payer: No Typology Code available for payment source | Admitting: Neurology

## 2021-04-09 ENCOUNTER — Ambulatory Visit: Payer: No Typology Code available for payment source | Admitting: Physician Assistant

## 2021-04-09 DIAGNOSIS — M359 Systemic involvement of connective tissue, unspecified: Secondary | ICD-10-CM

## 2021-04-09 DIAGNOSIS — R5382 Chronic fatigue, unspecified: Secondary | ICD-10-CM

## 2021-04-09 DIAGNOSIS — Z79899 Other long term (current) drug therapy: Secondary | ICD-10-CM

## 2021-04-09 DIAGNOSIS — Z8679 Personal history of other diseases of the circulatory system: Secondary | ICD-10-CM

## 2021-04-09 DIAGNOSIS — Z8659 Personal history of other mental and behavioral disorders: Secondary | ICD-10-CM

## 2021-04-09 DIAGNOSIS — E559 Vitamin D deficiency, unspecified: Secondary | ICD-10-CM

## 2021-04-09 DIAGNOSIS — M797 Fibromyalgia: Secondary | ICD-10-CM

## 2021-04-09 DIAGNOSIS — Z872 Personal history of diseases of the skin and subcutaneous tissue: Secondary | ICD-10-CM

## 2021-04-09 DIAGNOSIS — Z8709 Personal history of other diseases of the respiratory system: Secondary | ICD-10-CM

## 2021-04-16 NOTE — Progress Notes (Signed)
Office Visit Note  Patient: Whitney Harmon             Date of Birth: [birth year]           MRN: [id number]             PCP: Loyola Mast, PA-C Referring: Annye English Visit Date: 04/30/2021 Occupation: _0 @  Subjective:  Discuss starting humira for HS  History of Present Illness: Whitney Harmon is a 45 y.o. female with history of autoimmune disease and fibromyalgia.  She is taking plaquenil 200 mg 1 tablet by mouth twice daily.  She is tolerating Plaquenil without any side effects.  She states that her energy level has improved significantly since starting on Plaquenil.  She states that she is also noticed increased hair growth.  She denies any malar rash or symptoms of raynaud's recently.  She has had less frequent oral ulcers while on therapy.  She continues to experience intermittent hand pain which she attributes to typing on a daily basis for work.  She denies any joint swelling at this time.  She has been experiencing increased pain in her lower back.  She has been performing stretching exercises and has tried taking tylenol and ibuprofen for pain relief.  She previously tried PT and was seeing a Restaurant manager, fast food. She has not seen a back specialist recently.  She has occasional myalgias and muscle tenderness due to fibromyalgia.  Her discomfort due to trochanteric bursitis has improved.  She states that her dermatologist plans to initiate humira for management of HS.  She requires hepatitis panel and TB gold prior to starting humira.      Activities of Daily Living:  Patient reports morning stiffness for 30 minutes.   Patient Reports nocturnal pain.  Difficulty dressing/grooming: Reports Difficulty climbing stairs: Reports Difficulty getting out of chair: Reports Difficulty using hands for taps, buttons, cutlery, and/or writing: Denies  Review of Systems  Constitutional:  Positive for fatigue.  HENT:  Positive for mouth sores. Negative for mouth dryness and nose  dryness.        Nose sores  Eyes:  Positive for pain. Negative for itching, visual disturbance and dryness.  Respiratory:  Negative for cough, hemoptysis, shortness of breath and difficulty breathing.   Cardiovascular:  Negative for chest pain, palpitations, hypertension and swelling in legs/feet.  Gastrointestinal:  Negative for blood in stool, constipation and diarrhea.  Endocrine: Negative for increased urination.  Genitourinary:  Negative for difficulty urinating and painful urination.  Musculoskeletal:  Positive for joint pain, joint pain, myalgias, morning stiffness, muscle tenderness and myalgias. Negative for joint swelling and muscle weakness.  Skin:  Positive for nodules/bumps (HS). Negative for color change, pallor, rash, hair loss, redness, skin tightness, ulcers and sensitivity to sunlight.  Allergic/Immunologic: Negative for susceptible to infections.  Neurological:  Positive for headaches. Negative for dizziness, numbness and weakness.  Hematological:  Negative for swollen glands.  Psychiatric/Behavioral:  Negative for depressed mood, confusion and sleep disturbance. The patient is not nervous/anxious.    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   Obesity 04/22/2010   HYPERHIDROSIS 10/15/2009   GENITAL HERPES 09/12/2009   Anxiety and depression 06/18/2009   Essential hypertension 06/18/2009    Past Medical History:  Diagnosis Date   Asthma    childhood   Elevated antinuclear antibody (ANA) level 05/12/2011   Hypertension     Overweight(278.02)     Family History  Problem Relation Age of Onset   Arthritis Mother        osteoarthritis   Diabetes Mother    Hypertension Mother    Mental illness Mother        schizophrenia   Obesity Mother    Schizophrenia Mother    Hypertension Father    Alcohol abuse Other    Diabetes Other    Hypertension Other    Healthy Son    Hypertension Sister    Past Surgical History:  Procedure Laterality Date   CYST EXCISION     left underarm   LAPAROSCOPIC GASTRIC SLEEVE RESECTION  12/31/2014   SALPINGOOPHORECTOMY  05/26/2005   left   Social History   Social History Narrative   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  Constitutional:      Appearance: She is well-developed.  HENT:     Head: Normocephalic and atraumatic.  Eyes:     Conjunctiva/sclera: Conjunctivae normal.  Cardiovascular:     Rate and Rhythm: Normal rate and regular rhythm.     Heart sounds: Normal heart sounds.  Pulmonary:     Effort: Pulmonary effort is normal.     Breath sounds: Normal breath sounds.  Abdominal:     General: Bowel sounds are normal.     Palpations: Abdomen is soft.  Musculoskeletal:     Cervical back: Normal range of motion.  Skin:    General: Skin is warm and dry.     Capillary Refill: Capillary refill takes less than 2 seconds.     Comments: No malar rash  Neurological:     Mental Status: She is alert and oriented to person, place, and time.  Psychiatric:        Behavior: Behavior normal.     Musculoskeletal Exam: C-spine has good ROM.  Midline spinal tenderness in the  lumbar region.  Shoulder joints, elbow joints, wrist joints, MCPs, PIPs, and DIPs good ROM with no synovitis.  Complete fist formation bilaterally.  Hip joints, knee joints, and ankle joints have good ROM with no discomfort.  No warmth or effusion of knee joints.  No tenderness or swelling of ankle joints.   CDAI Exam: CDAI Score: -- Patient Global: --; Provider Global: -- Swollen: --; Tender: -- Joint Exam 04/30/2021   No joint exam has been documented for this visit   There is currently no information documented on the homunculus. Go to the Rheumatology activity and complete the homunculus joint exam.  Investigation: No additional findings.  Imaging: No results found.  Recent Labs: Lab Results  Component Value Date   WBC 11.7 (H) 11/13/2020   HGB 12.7 11/13/2020   PLT 411 (H) 11/13/2020  NA 135 11/13/2020   K 4.3 11/13/2020   CL 100 11/13/2020   CO2 26 11/13/2020   GLUCOSE 84 11/13/2020   BUN 15 11/13/2020   CREATININE 1.16 (H) 11/13/2020   BILITOT 0.6 11/13/2020   ALKPHOS 55 12/08/2016   AST 10 11/13/2020   ALT 8 11/13/2020   PROT 7.2 11/13/2020   ALBUMIN 4.0 12/08/2016   CALCIUM 9.4 11/13/2020   GFRAA 67 11/13/2020    Speciality Comments: PLQ eye exam: 08/27/2020 normal. [facility]- Dr. Alanda Slim. Follow up in 1 year.  Procedures:  No procedures performed Allergies: Fish oil, Peanut butter flavor, Peanuts [peanut oil], Apple, Other, and Vilazodone hcl     Assessment / Plan:     Visit Diagnoses: Autoimmune disease ([place]) - AVISE positive 1,1: ANA 1:640H, rheumatoid factor IgM equivocal, +Antiphosphatidylserine/prothrombin IgM, dsDNA-, complements WNL:  She is clinically doing well on Plaquenil 200 mg 1 tablet by mouth twice daily.  She is tolerating plaquenil without any side effects.  She has noticed a significant improvement in her symptoms since starting on plaquenil. Her energy level has improved and she has noticed less oral ulcers and increased hair  growth.  She continues to have recurrent nasal ulcers.  She has not had any malar rashes or raynaud's.  No digital ulcerations or signs of gangrene noted.  She has occasional arthralgias but no synovitis on examination.  Lab work from 11/13/20 was reviewed today in the office: lupus anticoagulant is negative, ESR WNL, complements WNL, and dsDNA is negative.  CBC and CMP updated on 01/02/21.  ESR 22 on 01/02/21.  The following lab work will be updated today.  She continues to have flares of HS and has been following along closely with her dermatologist.  The plan is to initiate humira prescribed by Dr. Lonna Cobb. Discussed that humira can induce a lupus-like syndrome.  Discussed the importance of monitoring for SE as well as signs or symptoms of an autoimmune disease flare.   She was advised to notify us if she develops any new or worsening symptoms. She will remain on the current treatment regimen.  She will follow up in 3 months.  - Plan: Protein / creatinine ratio, urine, Anti-DNA antibody, double-stranded, C3 and C4, Sedimentation rate, Lupus Anticoagulant Eval w/Reflex, ANA  She will provide updated FMLA paperwork for [place] to complete.  No changes or updates will need to be made to the new paperwork.   High risk medication use - Plaquenil 200 mg 1 tablet by mouth twice daily. PLQ eye exam: 08/27/2020 normal. [facility]- Dr. Alanda Slim. Follow up in 1 year. CBC and CMP updated on 01/02/21.  CBC and CMP will be updated today.   - Plan: CBC with Differential/Platelet, COMPLETE METABOLIC PANEL WITH GFR, QuantiFERON-TB Gold Plus, Serum protein electrophoresis with reflex, IgG, IgA, IgM, Hepatitis, Acute   Fibromyalgia: She has occasional myalgias and muscle tenderness.  Her discomfort due to trochanteric bursitis of both hips has improved.  Her energy level has improved.  Discussed the importance of regular exercises and good sleep hygiene. Discussed the importance of stress management.    Chronic fatigue: Her energy level has improved significantly since starting on plaquenil.   History of hidradenitis suppurativa: Inframammary region and bilateral axillary vaults-refractory to treatment. She has tried antiseptic soaps, topical clindamycin, oral doxycycline, and spironolactone.  She has been following up closely with Dr. Lonna Cobb at [facility].  At her most recent visit there was discussion about proceeding with humira.  Reviewed the  indications, contraindications, and potential side effects of humira today with the patient.  Discussed that a potential rare adverse effect is to develop drug induced lupus while on TNF inhibitors.  Advised her to monitor her symptoms closely while taking humira.  She was advised to notify us if her autoimmune disease symptoms worsen while taking humira.    Vitamin D deficiency: Vitamin D was 30 on 12/08/17.   Chronic midline low back pain with bilateral sciatica: She has been seeing increased lower back pain with intermittent bilateral sciatica.  According to the patient she was previously followed by a physical medicine and rehabilitation specialist.  She is also been to physical therapy as well as seeing a chiropractor in the past.  She has been performing stretching exercises on a regular basis and has tried Tylenol and ibuprofen for pain relief.  She declined updated x-rays of her lower back today.  A referral to Dr. Louanne Skye was placed today for further evaluation and recommendations.   Other medical conditions are listed as follows:   History of hypertension: BP was 105/72 today in the office.   History of asthma  History of obsessive compulsive disorder  Orders: Orders Placed This Encounter  Procedures   Protein / creatinine ratio, urine   CBC with Differential/Platelet   COMPLETE METABOLIC PANEL WITH GFR   Anti-DNA antibody, double-stranded   C3 and C4   Sedimentation rate   Lupus Anticoagulant Eval w/Reflex    ANA   QuantiFERON-TB Gold Plus   Serum protein electrophoresis with reflex   IgG, IgA, IgM   Hepatitis, Acute   No orders of the defined types were placed in this encounter.     Follow-Up Instructions: Return in about 3 months (around 07/29/2021) for Autoimmune Disease, Fibromyalgia, HS.   Ofilia Neas, PA-C  Note - This record has been created using Dragon software.  Chart creation errors have been sought, but may not always  have been located. Such creation errors do not reflect on  the standard of medical care.

## 2021-04-30 ENCOUNTER — Telehealth: Payer: Self-pay

## 2021-04-30 ENCOUNTER — Other Ambulatory Visit: Payer: Self-pay

## 2021-04-30 ENCOUNTER — Ambulatory Visit (INDEPENDENT_AMBULATORY_CARE_PROVIDER_SITE_OTHER): Payer: No Typology Code available for payment source | Admitting: Physician Assistant

## 2021-04-30 ENCOUNTER — Encounter: Payer: Self-pay | Admitting: Physician Assistant

## 2021-04-30 VITALS — BP 105/72 | HR 83 | Ht 66.5 in | Wt 220.0 lb

## 2021-04-30 DIAGNOSIS — Z8659 Personal history of other mental and behavioral disorders: Secondary | ICD-10-CM

## 2021-04-30 DIAGNOSIS — G8929 Other chronic pain: Secondary | ICD-10-CM

## 2021-04-30 DIAGNOSIS — R5382 Chronic fatigue, unspecified: Secondary | ICD-10-CM

## 2021-04-30 DIAGNOSIS — M797 Fibromyalgia: Secondary | ICD-10-CM | POA: Diagnosis not present

## 2021-04-30 DIAGNOSIS — M5442 Lumbago with sciatica, left side: Secondary | ICD-10-CM

## 2021-04-30 DIAGNOSIS — M359 Systemic involvement of connective tissue, unspecified: Secondary | ICD-10-CM

## 2021-04-30 DIAGNOSIS — E559 Vitamin D deficiency, unspecified: Secondary | ICD-10-CM

## 2021-04-30 DIAGNOSIS — Z872 Personal history of diseases of the skin and subcutaneous tissue: Secondary | ICD-10-CM

## 2021-04-30 DIAGNOSIS — M5441 Lumbago with sciatica, right side: Secondary | ICD-10-CM

## 2021-04-30 DIAGNOSIS — Z79899 Other long term (current) drug therapy: Secondary | ICD-10-CM | POA: Diagnosis not present

## 2021-04-30 DIAGNOSIS — Z8709 Personal history of other diseases of the respiratory system: Secondary | ICD-10-CM

## 2021-04-30 DIAGNOSIS — Z8679 Personal history of other diseases of the circulatory system: Secondary | ICD-10-CM

## 2021-04-30 NOTE — Telephone Encounter (Signed)
Patient was seen in the office today by Sherron Ales, PA-C and patient will be faxing FMLA paperwork to the office to be completed. Thanks!

## 2021-04-30 NOTE — Addendum Note (Signed)
Addended by: Ellen Henri on: 04/30/2021 03:40 PM   Modules accepted: Orders

## 2021-05-01 NOTE — Progress Notes (Signed)
CBC stable.  ESR and complements WNL.  Immunoglobulins WNL.

## 2021-05-02 NOTE — Progress Notes (Signed)
ANA is positive but slightly lower titer.   SPEP did not reveal any abnormal proteins.

## 2021-05-02 NOTE — Progress Notes (Signed)
TB gold, immunoglobulins WNL, and hepatitis panel negative.  Please forward results to her dermatologist as requested.    dsDNA is negative.  Creatinine is borderline elevated but improved. GFR is WNL.  Rest of CMP WNL.

## 2021-05-02 NOTE — Telephone Encounter (Signed)
Patient advised we have not received the FMLA paperwork to complete yet. Patient states she contacted them yesterday and received a copy via e-mail last night. Patient states she will get them to the office. Patient advised we will complete once we receive them.

## 2021-05-03 NOTE — Progress Notes (Signed)
Protein creatinine ratio is WNL

## 2021-05-05 LAB — COMPLETE METABOLIC PANEL WITH GFR
AG Ratio: 1.2 (calc) (ref 1.0–2.5)
ALT: 11 U/L (ref 6–29)
AST: 13 U/L (ref 10–30)
Albumin: 3.9 g/dL (ref 3.6–5.1)
Alkaline phosphatase (APISO): 39 U/L (ref 31–125)
BUN/Creatinine Ratio: 10 (calc) (ref 6–22)
BUN: 11 mg/dL (ref 7–25)
CO2: 23 mmol/L (ref 20–32)
Calcium: 9.5 mg/dL (ref 8.6–10.2)
Chloride: 102 mmol/L (ref 98–110)
Creat: 1.07 mg/dL — ABNORMAL HIGH (ref 0.50–0.99)
Globulin: 3.2 g/dL (calc) (ref 1.9–3.7)
Glucose, Bld: 82 mg/dL (ref 65–99)
Potassium: 3.9 mmol/L (ref 3.5–5.3)
Sodium: 138 mmol/L (ref 135–146)
Total Bilirubin: 0.6 mg/dL (ref 0.2–1.2)
Total Protein: 7.1 g/dL (ref 6.1–8.1)
eGFR: 66 mL/min/{1.73_m2} (ref >=60)

## 2021-05-05 LAB — PROTEIN / CREATININE RATIO, URINE
Creatinine, Urine: 138 mg/dL (ref 20–275)
Protein/Creat Ratio: 65 mg/g creat (ref 24–184)
Protein/Creatinine Ratio: 0.065 mg/mg creat (ref 0.024–0.184)
Total Protein, Urine: 9 mg/dL (ref 5–24)

## 2021-05-05 LAB — C3 AND C4
C3 Complement: 124 mg/dL (ref 83–193)
C4 Complement: 29 mg/dL (ref 15–57)

## 2021-05-05 LAB — CBC WITH DIFFERENTIAL/PLATELET
Absolute Monocytes: 633 cells/uL (ref 200–950)
Basophils Absolute: 56 cells/uL (ref 0–200)
Basophils Relative: 0.5 %
Eosinophils Absolute: 122 cells/uL (ref 15–500)
Eosinophils Relative: 1.1 %
HCT: 40.2 % (ref 35.0–45.0)
Hemoglobin: 12.7 g/dL (ref 11.7–15.5)
Lymphs Abs: 2775 cells/uL (ref 850–3900)
MCH: 24.4 pg — ABNORMAL LOW (ref 27.0–33.0)
MCHC: 31.6 g/dL — ABNORMAL LOW (ref 32.0–36.0)
MCV: 77.2 fL — ABNORMAL LOW (ref 80.0–100.0)
MPV: 11.4 fL (ref 7.5–12.5)
Monocytes Relative: 5.7 %
Neutro Abs: 7515 cells/uL (ref 1500–7800)
Neutrophils Relative %: 67.7 %
Platelets: 300 10*3/uL (ref 140–400)
RBC: 5.21 10*6/uL — ABNORMAL HIGH (ref 3.80–5.10)
RDW: 13.4 % (ref 11.0–15.0)
Total Lymphocyte: 25 %
WBC: 11.1 10*3/uL — ABNORMAL HIGH (ref 3.8–10.8)

## 2021-05-05 LAB — PROTEIN ELECTROPHORESIS, SERUM, WITH REFLEX
Albumin ELP: 3.9 g/dL (ref 3.8–4.8)
Alpha 1: 0.4 g/dL — ABNORMAL HIGH (ref 0.2–0.3)
Alpha 2: 0.8 g/dL (ref 0.5–0.9)
Beta 2: 0.4 g/dL (ref 0.2–0.5)
Beta Globulin: 0.5 g/dL (ref 0.4–0.6)
Gamma Globulin: 1.3 g/dL (ref 0.8–1.7)
Total Protein: 7.2 g/dL (ref 6.1–8.1)

## 2021-05-05 LAB — QUANTIFERON-TB GOLD PLUS
Mitogen-NIL: >10 IU/mL
NIL: 0.02 IU/mL
QuantiFERON-TB Gold Plus: NEGATIVE
TB1-NIL: 0 IU/mL
TB2-NIL: 0.01 IU/mL

## 2021-05-05 LAB — LUPUS ANTICOAGULANT EVAL W/ REFLEX
PTT-LA Screen: 36 s (ref <=40)
dRVVT: 37 s (ref <=45)

## 2021-05-05 LAB — SEDIMENTATION RATE: Sed Rate: 6 mm/h (ref 0–20)

## 2021-05-05 LAB — ANTI-NUCLEAR AB-TITER (ANA TITER): ANA Titer 1: 1:320 {titer} — ABNORMAL HIGH

## 2021-05-05 LAB — IGG, IGA, IGM
IgG (Immunoglobin G), Serum: 1487 mg/dL (ref 600–1640)
IgM, Serum: 174 mg/dL (ref 50–300)
Immunoglobulin A: 211 mg/dL (ref 47–310)

## 2021-05-05 LAB — HEPATITIS PANEL, ACUTE
Hep A IgM: NONREACTIVE
Hep B C IgM: NONREACTIVE
Hepatitis B Surface Ag: NONREACTIVE
Hepatitis C Ab: NONREACTIVE
SIGNAL TO CUT-OFF: 0.07 (ref <1.00)

## 2021-05-05 LAB — ANA: Anti Nuclear Antibody (ANA): POSITIVE — AB

## 2021-05-05 LAB — ANTI-DNA ANTIBODY, DOUBLE-STRANDED: ds DNA Ab: 1 IU/mL

## 2021-05-06 NOTE — Progress Notes (Signed)
Lupus anticoagulant not detected.

## 2021-05-07 ENCOUNTER — Telehealth: Payer: Self-pay | Admitting: Rheumatology

## 2021-05-07 NOTE — Telephone Encounter (Signed)
Paperwork completed. Will be faxed once signed by Dr. Corliss Skains

## 2021-05-07 NOTE — Telephone Encounter (Signed)
[facility] called the office regarding a Whitney Harmon. They stated that they received a fax from [place] with information about the patient but the fax was missing demographics and other important information. They requested another fax. Fax# unknown.

## 2021-05-07 NOTE — Telephone Encounter (Signed)
No referral

## 2021-05-08 ENCOUNTER — Telehealth: Payer: Self-pay | Admitting: *Deleted

## 2021-05-08 NOTE — Telephone Encounter (Signed)
Attempted to contact the patient and left message to advise patient we have completed her FMLA paperwork and that it has been faxed. Advised she has a copy at the front desk waiting for pick up.

## 2021-05-22 ENCOUNTER — Ambulatory Visit: Payer: No Typology Code available for payment source | Admitting: Surgery

## 2021-06-06 ENCOUNTER — Ambulatory Visit: Payer: No Typology Code available for payment source | Admitting: Surgery

## 2021-06-19 ENCOUNTER — Ambulatory Visit: Payer: No Typology Code available for payment source | Admitting: Surgery

## 2021-07-03 ENCOUNTER — Telehealth: Payer: Self-pay

## 2021-07-03 NOTE — Telephone Encounter (Signed)
Patient called stating she just changed jobs and needs to give them a copy of her TB gold test.  Patient states she had the lab in December and requested a return call.

## 2021-07-03 NOTE — Telephone Encounter (Signed)
Left message to advise patient she can stop by the office and pick up a copy of her results at the front desk.

## 2021-07-08 ENCOUNTER — Other Ambulatory Visit: Payer: Self-pay | Admitting: Physician Assistant

## 2021-07-08 DIAGNOSIS — M359 Systemic involvement of connective tissue, unspecified: Secondary | ICD-10-CM

## 2021-07-08 NOTE — Telephone Encounter (Signed)
Next Visit: 07/30/2021  Last Visit: 04/30/2021  Labs: 04/30/2021 CBC stable.  ESR and complements WNL.  Immunoglobulins WNL. TB gold, immunoglobulins WNL, and hepatitis panel negative. ANA is positive but slightly lower titer.   SPEP did not reveal any abnormal proteins.   Eye exam: 08/27/2020   Current Dose per office note on 04/30/2021: Plaquenil 200 mg 1 tablet by mouth twice daily  DX: Autoimmune disease   Last Fill: 02/01/2021  Okay to refill Plaquenil?

## 2021-07-16 NOTE — Progress Notes (Deleted)
? ?Office Visit Note ? ?Patient: Whitney Harmon             ?Date of Birth: Nov 08, 1975           ?MRN: [id number]             ?PCP: Loyola Mast, PA-C ?Referring: Loyola Mast, PA-C ?Visit Date: 07/30/2021 ?Occupation: @GUARO[employer] @ ? ?Subjective:  ?No chief complaint on file. ? ? ?History of Present Illness: Whitney Harmon is a 46 y.o. female ***  ? ?Activities of Daily Living:  ?Patient reports morning stiffness for *** {minute/hour:19697}.   ?Patient {ACTIONS;DENIES/REPORTS:21021675::"Denies"} nocturnal pain.  ?Difficulty dressing/grooming: {ACTIONS;DENIES/REPORTS:21021675::"Denies"} ?Difficulty climbing stairs: {ACTIONS;DENIES/REPORTS:21021675::"Denies"} ?Difficulty getting out of chair: {ACTIONS;DENIES/REPORTS:21021675::"Denies"} ?Difficulty using hands for taps, buttons, cutlery, and/or writing: {ACTIONS;DENIES/REPORTS:21021675::"Denies"} ? ?No Rheumatology ROS completed.  ? ?PMFS History:  ?Patient Active Problem List  ? Diagnosis Date Noted  ? Fibromyalgia 12/06/2016  ? Vitamin D deficiency 12/06/2016  ? History of asthma 12/06/2016  ? Preventative health care 02/19/2015  ? ANA positive 08/29/2014  ? Bilateral leg edema 01/14/2014  ? OCD (obsessive compulsive disorder) 12/26/2013  ? Leg wound, left 03/23/2013  ? Elevated antinuclear antibody (ANA) level 05/12/2011  ? Hidradenitis 02/06/2011  ? Acne 09/20/2010  ? Weight gain 09/20/2010  ? 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 Obesity 04/22/2010  ? HYPERHIDROSIS 10/15/2009  ? GENITAL HERPES 09/12/2009  ? Anxiety and depression 06/18/2009  ? Essential hypertension 06/18/2009  ?  ?Past Medical History:  ?Diagnosis Date  ? Asthma   ? childhood  ? Elevated antinuclear antibody (ANA) level 05/12/2011  ? Hypertension   ? Overweight(278.02)   ?  ?Family History  ?Problem Relation Age of Onset  ? Arthritis Mother   ?     osteoarthritis  ? Diabetes Mother   ? Hypertension Mother   ? Mental illness Mother   ?     schizophrenia  ? Obesity Mother   ? Schizophrenia  Mother   ? Hypertension Father   ? Alcohol abuse Other   ? Diabetes Other   ? Hypertension Other   ? Healthy Son   ? Hypertension Sister   ? ?Past Surgical History:  ?Procedure Laterality Date  ? CYST EXCISION    ? left underarm  ? LAPAROSCOPIC GASTRIC SLEEVE RESECTION  12/31/2014  ? SALPINGOOPHORECTOMY  05/26/2005  ? left  ? ?Social History  ? ?Social History Narrative  ? 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 ?Immunization History  ?Administered Date(s) Administered  ? PFIZER(Purple Top)SARS-COV-2 Vaccination 11/21/2019, 12/19/2019  ? PPD Test 04/05/2015  ? Tdap 03/13/2013  ?  ? ?Objective: ?Vital Signs: There were no vitals taken for this visit.  ? ?Physical Exam  ? ?Musculoskeletal Exam: *** ? ?CDAI Exam: ?CDAI Score: -- ?Patient Global: --; Provider Global: -- ?Swollen: --; Tender: -- ?Joint Exam 07/30/2021  ? ?No joint exam has been documented for this visit  ? ?There is currently no information documented on the homunculus. Go to the Rheumatology activity and complete the homunculus joint exam. ? ?Investigation: ?No additional findings. ? ?Imaging: ?No results found. ? ?Recent Labs: ?Lab Results  ?Component Value Date  ? WBC 11.1 (H) 04/30/2021  ? HGB 12.7 04/30/2021  ? PLT 300 04/30/2021  ? NA 138 04/30/2021  ? K 3.9 04/30/2021  ? CL 102 04/30/2021  ? CO2 23 04/30/2021  ? GLUCOSE 82 04/30/2021  ? BUN 11 04/30/2021  ? CREATININE 1.07 (H) 04/30/2021  ? BILITOT 0.6 04/30/2021  ? ALKPHOS 55 12/08/2016  ?  AST 13 04/30/2021  ? ALT 11 04/30/2021  ? PROT 7.1 04/30/2021  ? PROT 7.2 04/30/2021  ? ALBUMIN 4.0 12/08/2016  ? CALCIUM 9.5 04/30/2021  ? GFRAA 67 11/13/2020  ? QFTBGOLDPLUS NEGATIVE 04/30/2021  ? ? ?Speciality Comments: PLQ eye exam: 08/27/2020 normal. [facility]- Dr. Alanda Slim. Follow up in 1 year. ? ?Procedures:  ?No procedures performed ?Allergies: Fish oil, Peanut butter flavor, Peanuts [peanut oil], Apple juice, Other, and Vilazodone hcl  ? ?Assessment / Plan:     ?Visit Diagnoses: No diagnosis  found. ? ?Orders: ?No orders of the defined types were placed in this encounter. ? ?No orders of the defined types were placed in this encounter. ? ? ?Face-to-face time spent with patient was *** minutes. Greater than 50% of time was spent in counseling and coordination of care. ? ?Follow-Up Instructions: No follow-ups on file. ? ? ?Earnestine Mealing, CMA ? ?Note - This record has been created using [employer].  ?Chart creation errors have been sought, but may not always  ?have been located. Such creation errors do not reflect on  ?the standard of medical care.  ?

## 2021-07-30 ENCOUNTER — Ambulatory Visit: Payer: No Typology Code available for payment source | Admitting: Rheumatology

## 2021-07-30 DIAGNOSIS — M797 Fibromyalgia: Secondary | ICD-10-CM

## 2021-07-30 DIAGNOSIS — Z79899 Other long term (current) drug therapy: Secondary | ICD-10-CM

## 2021-07-30 DIAGNOSIS — R5382 Chronic fatigue, unspecified: Secondary | ICD-10-CM

## 2021-07-30 DIAGNOSIS — Z872 Personal history of diseases of the skin and subcutaneous tissue: Secondary | ICD-10-CM

## 2021-07-30 DIAGNOSIS — Z8659 Personal history of other mental and behavioral disorders: Secondary | ICD-10-CM

## 2021-07-30 DIAGNOSIS — Z8679 Personal history of other diseases of the circulatory system: Secondary | ICD-10-CM

## 2021-07-30 DIAGNOSIS — M359 Systemic involvement of connective tissue, unspecified: Secondary | ICD-10-CM

## 2021-07-30 DIAGNOSIS — Z8709 Personal history of other diseases of the respiratory system: Secondary | ICD-10-CM

## 2021-07-30 DIAGNOSIS — E559 Vitamin D deficiency, unspecified: Secondary | ICD-10-CM

## 2021-07-30 DIAGNOSIS — G8929 Other chronic pain: Secondary | ICD-10-CM

## 2021-08-21 NOTE — Progress Notes (Signed)
? ?Office Visit Note ? ?Patient: Whitney Harmon             ?Date of Birth: 20-Jun-1975           ?MRN: [id number]             ?PCP: Bryon Lions, PA-C ?Referring: Bryon Lions, PA-C ?Visit Date: 09/04/2021 ?Occupation: @GUAROCC @ ? ?Subjective:  ?Sicca symptoms  ? ?History of Present Illness: Whitney Harmon is a 46 y.o. female with history of autoimmune disease and fibromyalgia.  She is taking plaquenil 200 mg 1 tablet by mouth twice daily.  She is tolerating Plaquenil without any side effects and has not missed any doses recently.  She states for the past 2-1/2 weeks she has been experiencing increased left eye dryness and a gritty sensation at times.  She is also had some intermittent sensitivity to light in the left eye.  She has not been seen by her ophthalmologist yet.  She states her symptoms have started to improve and she has been able to wear her contacts some.  She states she is also had some increased mouth dryness for the past 1 week.  She denies any new medications.  She has not tried any over-the-counter products for symptomatic relief.  She experiences intermittent oral ulcerations but denies any nasal ulcerations. ?She states that overall her energy level has improved since starting on Plaquenil.  She continues to experience generalized aching but denies any joint swelling.  She continues to have chronic pain in her lower back and has been seeing a 57.  She experiences intermittent pain in her right wrist especially when having to time for prolonged periods of time for work.  She has not tried wearing a wrist brace. ? ? ? ? ? ?Activities of Daily Living:  ?Patient reports morning stiffness for 30 minutes.   ?Patient Reports nocturnal pain.  ?Difficulty dressing/grooming: Reports ?Difficulty climbing stairs: Denies ?Difficulty getting out of chair: Denies ?Difficulty using hands for taps, buttons, cutlery, and/or writing: Denies ? ?Review of Systems  ?Constitutional:  Positive for  fatigue.  ?HENT:  Positive for mouth dryness. Negative for mouth sores and nose dryness.   ?Eyes:  Positive for photophobia, pain and dryness. Negative for itching.  ?Respiratory:  Negative for shortness of breath and difficulty breathing.   ?Cardiovascular:  Positive for chest pain and palpitations.  ?Gastrointestinal:  Positive for constipation. Negative for blood in stool and diarrhea.  ?Endocrine: Negative for increased urination.  ?Genitourinary:  Negative for difficulty urinating.  ?Musculoskeletal:  Positive for joint pain, joint pain, myalgias, morning stiffness, muscle tenderness and myalgias. Negative for joint swelling.  ?Skin:  Negative for color change, rash and redness.  ?Allergic/Immunologic: Negative for susceptible to infections.  ?Neurological:  Positive for memory loss and weakness. Negative for dizziness, numbness and headaches.  ?Hematological:  Positive for bruising/bleeding tendency.  ?Psychiatric/Behavioral:  Positive for confusion.   ? ?PMFS History:  ?Patient Active Problem List  ? Diagnosis Date Noted  ? Fibromyalgia 12/06/2016  ? Vitamin D deficiency 12/06/2016  ? History of asthma 12/06/2016  ? Preventative health care 02/19/2015  ? ANA positive 08/29/2014  ? Bilateral leg edema 01/14/2014  ? OCD (obsessive compulsive disorder) 12/26/2013  ? Leg wound, left 03/23/2013  ? Elevated antinuclear antibody (ANA) level 05/12/2011  ? Hidradenitis 02/06/2011  ? Acne 09/20/2010  ? Weight gain 09/20/2010  ? 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 Obesity 04/22/2010  ? HYPERHIDROSIS 10/15/2009  ? GENITAL HERPES 09/12/2009  ? Anxiety  and depression 06/18/2009  ? Essential hypertension 06/18/2009  ?  ?Past Medical History:  ?Diagnosis Date  ? Asthma   ? childhood  ? Elevated antinuclear antibody (ANA) level 05/12/2011  ? Hypertension   ? Overweight(278.02)   ?  ?Family History  ?Problem Relation Age of Onset  ? Arthritis Mother   ?     osteoarthritis  ? Diabetes Mother   ? Hypertension Mother   ? Mental  illness Mother   ?     schizophrenia  ? Obesity Mother   ? Schizophrenia Mother   ? Hypertension Father   ? Alcohol abuse Other   ? Diabetes Other   ? Hypertension Other   ? Healthy Son   ? Hypertension Sister   ? ?Past Surgical History:  ?Procedure Laterality Date  ? CYST EXCISION    ? left underarm  ? LAPAROSCOPIC GASTRIC SLEEVE RESECTION  12/31/2014  ? SALPINGOOPHORECTOMY  05/26/2005  ? left  ? ?Social History  ? ?Social History Narrative  ? 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 ?Immunization History  ?Administered Date(s) Administered  ? PFIZER(Purple Top)SARS-COV-2 Vaccination 11/21/2019, 12/19/2019  ? PPD Test 04/05/2015  ? Tdap 03/13/2013  ?  ? ?Objective: ?Vital Signs: BP 105/73 (BP Location: Left Arm, Patient Position: Sitting, Cuff Size: Normal)   Pulse 75   Ht 5\' 7"  (1.702 m)   Wt 206 lb 12.8 oz (93.8 kg)   BMI 32.39 kg/m?   ? ?Physical Exam ?Vitals and nursing note reviewed.  ?Constitutional:   ?   Appearance: She is well-developed.  ?HENT:  ?   Head: Normocephalic and atraumatic.  ?Eyes:  ?   Conjunctiva/sclera: Conjunctivae normal.  ?Cardiovascular:  ?   Rate and Rhythm: Normal rate and regular rhythm.  ?   Heart sounds: Normal heart sounds.  ?Pulmonary:  ?   Effort: Pulmonary effort is normal.  ?   Breath sounds: Normal breath sounds.  ?Abdominal:  ?   General: Bowel sounds are normal.  ?   Palpations: Abdomen is soft.  ?Musculoskeletal:  ?   Cervical back: Normal range of motion.  ?Skin: ?   General: Skin is warm and dry.  ?   Capillary Refill: Capillary refill takes less than 2 seconds.  ?Neurological:  ?   Mental Status: She is alert and oriented to person, place, and time.  ?Psychiatric:     ?   Behavior: Behavior normal.  ?  ? ?Musculoskeletal Exam: Positive tender points on examination.  C-spine has good range of motion with no discomfort.  Midline spinal tenderness in the lumbar region with discomfort with range of motion.  Shoulder joints, elbow joints, wrist joints, MCPs, PIPs, DIPs have good  range of motion with no synovitis.  Complete fist formation bilaterally.  Tenderness palpation over the right wrist joint but no inflammation noted.  Hip joints have good range of motion with no groin pain.  Tenderness palpation over both trochanteric bursa.  Knee joints have good range of motion with no warmth or effusion.  Ankle joints have good range of motion with no tenderness or joint swelling. ? ?CDAI Exam: ?CDAI Score: -- ?Patient Global: --; Provider Global: -- ?Swollen: --; Tender: -- ?Joint Exam 09/04/2021  ? ?No joint exam has been documented for this visit  ? ?There is currently no information documented on the homunculus. Go to the Rheumatology activity and complete the homunculus joint exam. ? ?Investigation: ?No additional findings. ? ?Imaging: ?No results found. ? ?Recent Labs: ?Lab Results  ?  Component Value Date  ? WBC 11.1 (H) 04/30/2021  ? HGB 12.7 04/30/2021  ? PLT 300 04/30/2021  ? NA 138 04/30/2021  ? K 3.9 04/30/2021  ? CL 102 04/30/2021  ? CO2 23 04/30/2021  ? GLUCOSE 82 04/30/2021  ? BUN 11 04/30/2021  ? CREATININE 1.07 (H) 04/30/2021  ? BILITOT 0.6 04/30/2021  ? ALKPHOS 55 12/08/2016  ? AST 13 04/30/2021  ? ALT 11 04/30/2021  ? PROT 7.1 04/30/2021  ? PROT 7.2 04/30/2021  ? ALBUMIN 4.0 12/08/2016  ? CALCIUM 9.5 04/30/2021  ? GFRAA 67 11/13/2020  ? QFTBGOLDPLUS NEGATIVE 04/30/2021  ? ? ?Speciality Comments: PLQ eye exam: 08/27/2020 normal. [facility]- Dr. Genia DelMincey. Follow up in 1 year. ? ?Procedures:  ?No procedures performed ?Allergies: Fish oil, Peanut butter flavor, Peanuts [peanut oil], Apple juice, Other, and Vilazodone hcl  ? ? ? ? ?Assessment / Plan:     ?Visit Diagnoses: Autoimmune disease (HCC) - AVISE positive 1,1: ANA 1:640H, rheumatoid factor IgM equivocal, +Antiphosphatidylserine/prothrombin IgM, dsDNA-, complements WNL: She is not exhibiting any signs or symptoms of active disease at this time.  She is clinically doing well taking Plaquenil 200 mg 1 tablet by mouth  twice daily.  She continues to tolerate Plaquenil without any side effects.  She has noticed some increased sicca symptoms over the past 1 to 2 weeks so I discussed the use of over-the-counter products in detail i

## 2021-09-04 ENCOUNTER — Ambulatory Visit (INDEPENDENT_AMBULATORY_CARE_PROVIDER_SITE_OTHER): Payer: No Typology Code available for payment source | Admitting: Physician Assistant

## 2021-09-04 ENCOUNTER — Encounter: Payer: Self-pay | Admitting: Physician Assistant

## 2021-09-04 VITALS — BP 105/73 | HR 75 | Ht 67.0 in | Wt 206.8 lb

## 2021-09-04 DIAGNOSIS — M797 Fibromyalgia: Secondary | ICD-10-CM | POA: Diagnosis not present

## 2021-09-04 DIAGNOSIS — R5382 Chronic fatigue, unspecified: Secondary | ICD-10-CM

## 2021-09-04 DIAGNOSIS — M359 Systemic involvement of connective tissue, unspecified: Secondary | ICD-10-CM

## 2021-09-04 DIAGNOSIS — Z79899 Other long term (current) drug therapy: Secondary | ICD-10-CM

## 2021-09-04 DIAGNOSIS — Z8659 Personal history of other mental and behavioral disorders: Secondary | ICD-10-CM

## 2021-09-04 DIAGNOSIS — Z8679 Personal history of other diseases of the circulatory system: Secondary | ICD-10-CM

## 2021-09-04 DIAGNOSIS — Z8709 Personal history of other diseases of the respiratory system: Secondary | ICD-10-CM

## 2021-09-04 DIAGNOSIS — Z872 Personal history of diseases of the skin and subcutaneous tissue: Secondary | ICD-10-CM

## 2021-09-04 DIAGNOSIS — E559 Vitamin D deficiency, unspecified: Secondary | ICD-10-CM

## 2021-09-04 NOTE — Patient Instructions (Addendum)
[facility]- Dr. Genia Del. Call to schedule plaquenil eye examination.  ? ? ?Biotene products and xylimelts for mouth dryness   ? ?Refresh or systane eye drops for eye dryness  ? ? ? ? ?Standing Labs ?We placed an order today for your standing lab work.  ? ?Please have your standing labs drawn in May  ? ?If possible, please have your labs drawn 2 weeks prior to your appointment so that the provider can discuss your results at your appointment. ? ?Please note that you may see your imaging and lab results in MyChart before we have reviewed them. ?We may be awaiting multiple results to interpret others before contacting you. ?Please allow our office up to 72 hours to thoroughly review all of the results before contacting the office for clarification of your results. ? ?We have open lab daily: ?Monday through Thursday from 1:30-4:30 PM and Friday from 1:30-4:00 PM ?at the office of Dr. Pollyann Savoy, [facility].   ?Please be advised, all patients with office appointments requiring lab work will take precedent over walk-in lab work.  ?If possible, please come for your lab work on Monday and Friday afternoons, as you may experience shorter wait times. ?The office is located at [address], [address], [place], [place] [address] ?No appointment is necessary.   ?Labs are drawn by [facility]. Please bring your co-pay at the time of your lab draw.  You may receive a bill from [facility] for your lab work. ? ?Please note if you are on Hydroxychloroquine and and an order has been placed for a Hydroxychloroquine level, you will need to have it drawn 4 hours or more after your last dose. ? ?If you wish to have your labs drawn at another location, please call the office 24 hours in advance to send orders. ? ?If you have any questions regarding directions or hours of operation,  ?please call [phone].   ?As a reminder, please drink plenty of water prior to coming for your lab work. Thanks! ? ?

## 2021-11-13 ENCOUNTER — Other Ambulatory Visit: Payer: Self-pay | Admitting: Physician Assistant

## 2021-11-13 DIAGNOSIS — M359 Systemic involvement of connective tissue, unspecified: Secondary | ICD-10-CM

## 2021-11-13 NOTE — Telephone Encounter (Signed)
Next Visit: 02/13/2022  Last Visit: 09/04/2021  Labs: 08/27/2021 MCV 75, MCH 24.6, Creat. 1.11  Eye exam: 08/27/2020 normal.   Current Dose per office note 09/04/2021: Plaquenil 200 mg 1 tablet by mouth twice daily  CV:ELFYBOFBPZ disease   Last Fill: 07/08/2021  Left message to advise patient we are in need of her updated PLQ eye exam.   Okay to refill Plaquenil?

## 2021-12-04 ENCOUNTER — Other Ambulatory Visit: Payer: Self-pay | Admitting: Physician Assistant

## 2021-12-04 DIAGNOSIS — M359 Systemic involvement of connective tissue, unspecified: Secondary | ICD-10-CM

## 2021-12-25 ENCOUNTER — Other Ambulatory Visit: Payer: Self-pay | Admitting: Physician Assistant

## 2021-12-25 DIAGNOSIS — M359 Systemic involvement of connective tissue, unspecified: Secondary | ICD-10-CM

## 2021-12-25 NOTE — Telephone Encounter (Signed)
Next Visit: 02/13/2022   Last Visit: 09/04/2021   Labs: 08/27/2021 MCV 75, MCH 24.6, Creat. 1.11   Eye exam: 08/27/2020 normal.    Current Dose per office note 09/04/2021: Plaquenil 200 mg 1 tablet by mouth twice daily   RK:YHCWCBJSEG disease    Last Fill: 11/13/2021 (30 day supply)   Patient advised she is due to update her PLQ eye exam. Patient states she has an appointment scheduled for 01/16/2022.    Okay to refill Plaquenil?

## 2022-01-07 ENCOUNTER — Telehealth: Payer: Self-pay | Admitting: *Deleted

## 2022-01-07 NOTE — Telephone Encounter (Signed)
Patient contacted the office and left message stating it is time to renew her FMLA paperwork. Patient wants to know the best way to get it to [place]. Attempted to contact the patient and left message to advise patient to fax it to our office. Left fax number on her voicemail.

## 2022-01-09 ENCOUNTER — Telehealth: Payer: Self-pay | Admitting: *Deleted

## 2022-01-09 NOTE — Telephone Encounter (Signed)
Left message to advise patient her FMLA paperwork has been completed and left at front desk for pick up.

## 2022-01-20 ENCOUNTER — Other Ambulatory Visit: Payer: Self-pay | Admitting: Physician Assistant

## 2022-01-20 DIAGNOSIS — M359 Systemic involvement of connective tissue, unspecified: Secondary | ICD-10-CM

## 2022-01-20 NOTE — Telephone Encounter (Signed)
Next Visit: 02/13/2022   Last Visit: 09/04/2021   Labs: 08/27/2021 MCV 75, MCH 24.6, Creat. 1.11   Eye exam: 08/27/2020 normal.    Current Dose per office note 09/04/2021: Plaquenil 200 mg 1 tablet by mouth twice daily   VV:OHYWVPXTGG disease    Last Fill: 12/25/2021 (30 day supply)   Left message to advise we need her update PLQ eye exam.    Okay to refill Plaquenil?

## 2022-01-31 NOTE — Progress Notes (Signed)
Office Visit Note  Patient: Whitney Harmon             Date of Birth: 01-27-1976           MRN: [id number]             PCP: Bryon Lions, PA-C Referring: Bryon Lions, PA-C Visit Date: 02/13/2022 Occupation: @GUAROCC @  Subjective:  Medication  management  History of Present Illness: Whitney Harmon is a 46 y.o. female with history of autoimmune disease.  She has been taking hydroxychloroquine 200 mg twice a day Monday to Friday without any interruption.  She continues to have dry mouth and fatigue.  There is no history of oral ulcers, nasal ulcers, malar rash, photosensitivity, Raynaud's phenomenon or lymphadenopathy.  She has been experiencing some discomfort in her ankle joints.  She states she was started on Humira by her dermatologist about 2 months ago for hidradenitis suppurativa.  She has noticed improvement in the rash.  Activities of Daily Living:  Patient reports morning stiffness for 1 hour.   Patient Reports nocturnal pain.  Difficulty dressing/grooming: Denies Difficulty climbing stairs: Denies Difficulty getting out of chair: Denies Difficulty using hands for taps, buttons, cutlery, and/or writing: Denies  Review of Systems  Constitutional:  Positive for fatigue.  HENT:  Positive for mouth dryness. Negative for mouth sores.   Eyes:  Negative for dryness.  Respiratory:  Negative for difficulty breathing.   Cardiovascular:  Positive for chest pain and palpitations.       Cardiology work-up was negative per patient.  Gastrointestinal:  Positive for constipation. Negative for blood in stool and diarrhea.  Endocrine: Negative for increased urination.  Genitourinary:  Negative for involuntary urination.  Musculoskeletal:  Positive for joint pain, joint pain and morning stiffness. Negative for gait problem, joint swelling, myalgias, muscle weakness, muscle tenderness and myalgias.  Skin:  Positive for hair loss and sensitivity to sunlight. Negative for color  change and rash.  Allergic/Immunologic: Negative for susceptible to infections.  Neurological:  Positive for dizziness. Negative for headaches.  Hematological:  Negative for swollen glands.  Psychiatric/Behavioral:  Positive for sleep disturbance. Negative for depressed mood. The patient is nervous/anxious.     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   Obesity 04/22/2010   HYPERHIDROSIS 10/15/2009   GENITAL HERPES 09/12/2009   Anxiety and depression 06/18/2009   Essential hypertension 06/18/2009    Past Medical History:  Diagnosis Date   Asthma    childhood   Elevated antinuclear antibody (ANA) level 05/12/2011   Hypertension    Overweight(278.02)     Family History  Problem Relation Age of Onset   Arthritis Mother        osteoarthritis   Diabetes Mother    Hypertension Mother    Mental illness Mother        schizophrenia   Obesity Mother    Schizophrenia Mother    Hypertension Father    Alcohol abuse Other    Diabetes Other    Hypertension Other    Healthy Son  Hypertension Sister    Past Surgical History:  Procedure Laterality Date   CYST EXCISION     left underarm   LAPAROSCOPIC GASTRIC SLEEVE RESECTION  12/31/2014   SALPINGOOPHORECTOMY  05/26/2005   left   Social History   Social History Narrative   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  Constitutional:      Appearance: She is well-developed.  HENT:     Head: Normocephalic and atraumatic.  Eyes:     Conjunctiva/sclera: Conjunctivae normal.  Cardiovascular:     Rate and Rhythm: Normal rate and regular rhythm.     Heart sounds: Normal heart sounds.  Pulmonary:     Effort: Pulmonary effort is normal.     Breath sounds: Normal breath sounds.  Abdominal:     General: Bowel sounds are normal.     Palpations: Abdomen is soft.  Musculoskeletal:     Cervical back: Normal range of motion.  Lymphadenopathy:     Cervical: No cervical adenopathy.  Skin:    General: Skin is warm and dry.     Capillary Refill: Capillary refill takes less than 2 seconds.  Neurological:     Mental Status: She is alert and oriented to person, place, and time.  Psychiatric:        Behavior: Behavior normal.      Musculoskeletal Exam: C-spine was in good range of motion.  Shoulder joints, elbow joints, wrist joints, MCPs PIPs and DIPs Juengel range of motion with no synovitis.  Hip joints, knee joints, ankles, MTPs and PIPs with good range of motion with no synovitis.  CDAI Exam: CDAI Score: -- Patient Global: --; Provider Global: -- Swollen: --; Tender: -- Joint Exam 02/13/2022   No joint exam has been documented for this visit   There is currently no information documented on the homunculus. Go to the Rheumatology activity and complete the homunculus joint exam.  Investigation: No additional findings.  Imaging: No results found.  Recent Labs: Lab Results  Component Value Date   WBC 11.1 (H) 04/30/2021   HGB 12.7 04/30/2021   PLT 300 04/30/2021   NA 138 04/30/2021   K 3.9 04/30/2021   CL 102 04/30/2021   CO2 23 04/30/2021    GLUCOSE 82 04/30/2021   BUN 11 04/30/2021   CREATININE 1.07 (H) 04/30/2021   BILITOT 0.6 04/30/2021   ALKPHOS 55 12/08/2016   AST 13 04/30/2021   ALT 11 04/30/2021   PROT 7.1 04/30/2021   PROT 7.2 04/30/2021   ALBUMIN 4.0 12/08/2016   CALCIUM 9.5 04/30/2021   GFRAA 67 11/13/2020   QFTBGOLDPLUS NEGATIVE 04/30/2021    Speciality Comments: PLQ eye exam: 08/27/2020 normal. [facility]- Dr. Genia Del. Follow up in 1 year.  Procedures:  No procedures performed Allergies: Fish oil, Peanut butter flavor, Peanuts [peanut oil], Apple juice, Other, and Vilazodone hcl   Assessment / Plan:     Visit Diagnoses: Autoimmune disease (HCC) - AVISE positive 1,1: ANA 1:640H, rheumatoid factor IgMequivocal,+Antiphosphatidylserine/prothrombin IgM, dsDNA-, complements WNL: She continues to  have fatigue and dry mouth.  She denies any history of malar rash, photosensitivity, Raynaud's phenomenon.  She continues to have some arthralgias.  She said the fatigue has increased recently.  She was placed on Humira by her dermatologist for hidradenitis suppurativa.  I discussed the chance of developing autoimmune antibodies with Humira.  I will recheck labs today.  I advised her to contact me if she develops any new symptoms.  We will contact her once the lab results are available.  High risk medication use - Plaquenil 200 mg 1 tablet by mouth twice daily. PLQ eye exam: 08/27/2020.  According to the patient she had the eye examination on January 14, 2022.  We will request results.  She has not had labs since April 30, 2021.  We will check labs today.  Information on immunization was placed in the AVS.  Fibromyalgia-she continues to have some generalized pain and discomfort.  She gives history of hyperalgesia and generalized achiness.-She can planes of increased fatigue.  Regular exercise and stretching was discussed.  Chronic fatigue  History of hidradenitis suppurativa - Inframammary region and bilateral  axillary vaults-refractory to treatment.  She is on Humira by her dermatologist now.  Increased risk of infection with Humira was discussed.  A handout on immunization was placed in the AVS.  She was advised to hold Humira if she develops an infection.  History of asthma  History of hypertension-blood pressure was normal today.  History of obsessive compulsive disorder  Vitamin D deficiency-vitamin D was low normal in 2019 at 30.  She was advised to take vitamin D on a regular basis.  Orders: Orders Placed This Encounter  Procedures   Protein / creatinine ratio, urine   CBC with Differential/Platelet   COMPLETE METABOLIC PANEL WITH GFR   Anti-DNA antibody, double-stranded   C3 and C4   Sedimentation rate   ANA   Phosphatidylserine/Prothrombin (PS/PT) Antibodies (IgG, IgM)   No orders of the defined types were placed in this encounter.    Follow-Up Instructions: Return in about 5 months (around 07/16/2022) for Autoimmune disease.   Pollyann Savoy, MD  Note - This record has been created using Animal nutritionist.  Chart creation errors have been sought, but may not always  have been located. Such creation errors do not reflect on  the standard of medical care.

## 2022-02-13 ENCOUNTER — Ambulatory Visit: Payer: No Typology Code available for payment source | Attending: Rheumatology | Admitting: Rheumatology

## 2022-02-13 ENCOUNTER — Encounter: Payer: Self-pay | Admitting: Rheumatology

## 2022-02-13 VITALS — BP 104/72 | HR 71 | Resp 15 | Ht 66.5 in | Wt 200.8 lb

## 2022-02-13 DIAGNOSIS — R5382 Chronic fatigue, unspecified: Secondary | ICD-10-CM

## 2022-02-13 DIAGNOSIS — Z872 Personal history of diseases of the skin and subcutaneous tissue: Secondary | ICD-10-CM

## 2022-02-13 DIAGNOSIS — Z8659 Personal history of other mental and behavioral disorders: Secondary | ICD-10-CM

## 2022-02-13 DIAGNOSIS — E559 Vitamin D deficiency, unspecified: Secondary | ICD-10-CM

## 2022-02-13 DIAGNOSIS — M797 Fibromyalgia: Secondary | ICD-10-CM | POA: Diagnosis not present

## 2022-02-13 DIAGNOSIS — Z8679 Personal history of other diseases of the circulatory system: Secondary | ICD-10-CM

## 2022-02-13 DIAGNOSIS — M359 Systemic involvement of connective tissue, unspecified: Secondary | ICD-10-CM

## 2022-02-13 DIAGNOSIS — Z79899 Other long term (current) drug therapy: Secondary | ICD-10-CM | POA: Diagnosis not present

## 2022-02-13 DIAGNOSIS — Z8709 Personal history of other diseases of the respiratory system: Secondary | ICD-10-CM

## 2022-02-13 NOTE — Patient Instructions (Signed)

## 2022-02-17 LAB — CBC WITH DIFFERENTIAL/PLATELET
Absolute Monocytes: 480 cells/uL (ref 200–950)
Basophils Absolute: 68 cells/uL (ref 0–200)
Basophils Relative: 0.9 %
Eosinophils Absolute: 60 cells/uL (ref 15–500)
Eosinophils Relative: 0.8 %
HCT: 37.7 % (ref 35.0–45.0)
Hemoglobin: 12.4 g/dL (ref 11.7–15.5)
Lymphs Abs: 3150 cells/uL (ref 850–3900)
MCH: 25.5 pg — ABNORMAL LOW (ref 27.0–33.0)
MCHC: 32.9 g/dL (ref 32.0–36.0)
MCV: 77.6 fL — ABNORMAL LOW (ref 80.0–100.0)
MPV: 11.6 fL (ref 7.5–12.5)
Monocytes Relative: 6.4 %
Neutro Abs: 3743 cells/uL (ref 1500–7800)
Neutrophils Relative %: 49.9 %
Platelets: 240 10*3/uL (ref 140–400)
RBC: 4.86 10*6/uL (ref 3.80–5.10)
RDW: 13 % (ref 11.0–15.0)
Total Lymphocyte: 42 %
WBC: 7.5 10*3/uL (ref 3.8–10.8)

## 2022-02-17 LAB — COMPLETE METABOLIC PANEL WITH GFR
AG Ratio: 1.5 (calc) (ref 1.0–2.5)
ALT: 13 U/L (ref 6–29)
AST: 11 U/L (ref 10–30)
Albumin: 4 g/dL (ref 3.6–5.1)
Alkaline phosphatase (APISO): 27 U/L — ABNORMAL LOW (ref 31–125)
BUN/Creatinine Ratio: 13 (calc) (ref 6–22)
BUN: 14 mg/dL (ref 7–25)
CO2: 30 mmol/L (ref 20–32)
Calcium: 9.4 mg/dL (ref 8.6–10.2)
Chloride: 101 mmol/L (ref 98–110)
Creat: 1.08 mg/dL — ABNORMAL HIGH (ref 0.50–0.99)
Globulin: 2.6 g/dL (calc) (ref 1.9–3.7)
Glucose, Bld: 83 mg/dL (ref 65–99)
Potassium: 3.8 mmol/L (ref 3.5–5.3)
Sodium: 139 mmol/L (ref 135–146)
Total Bilirubin: 0.7 mg/dL (ref 0.2–1.2)
Total Protein: 6.6 g/dL (ref 6.1–8.1)
eGFR: 65 mL/min/{1.73_m2} (ref >=60)

## 2022-02-17 LAB — PROTEIN / CREATININE RATIO, URINE
Creatinine, Urine: 195 mg/dL (ref 20–275)
Protein/Creat Ratio: 87 mg/g creat (ref 24–184)
Protein/Creatinine Ratio: 0.087 mg/mg creat (ref 0.024–0.184)
Total Protein, Urine: 17 mg/dL (ref 5–24)

## 2022-02-17 LAB — ANTI-NUCLEAR AB-TITER (ANA TITER): ANA Titer 1: 1:320 {titer} — ABNORMAL HIGH

## 2022-02-17 LAB — ANA: Anti Nuclear Antibody (ANA): POSITIVE — AB

## 2022-02-17 LAB — SEDIMENTATION RATE: Sed Rate: 2 mm/h (ref 0–20)

## 2022-02-17 LAB — C3 AND C4
C3 Complement: 100 mg/dL (ref 83–193)
C4 Complement: 17 mg/dL (ref 15–57)

## 2022-02-17 LAB — PHOSPHATIDYLSERINE/PROTHROMBIN (PS/PT) ANTIBODIES (IGG, IGM)
Phosphatidylserine/Prothrombin Ab (IgG): 10 U (ref <=30)
Phosphatidylserine/Prothrombin Ab (IgM): 35 U — ABNORMAL HIGH (ref <=30)

## 2022-02-17 LAB — ANTI-DNA ANTIBODY, DOUBLE-STRANDED: ds DNA Ab: <1 IU/mL

## 2022-02-17 NOTE — Progress Notes (Signed)
CBC is normal CMP shows mildly elevated creatinine which is stable, most likely due to the use of spironolactone.  Positive ANA, positive low titer antiphosphatidylserine antibody IgM noted.  Urine protein negative, double-stranded DNA negative, complements normal, sed rate normal.  Labs do not indicate a disease flare.  No change in treatment advised.

## 2022-02-18 ENCOUNTER — Other Ambulatory Visit: Payer: Self-pay | Admitting: Physician Assistant

## 2022-02-18 DIAGNOSIS — M359 Systemic involvement of connective tissue, unspecified: Secondary | ICD-10-CM

## 2022-02-18 NOTE — Telephone Encounter (Signed)
Next Visit: 07/17/2022  Last Visit: 02/13/2022  Labs: 02/13/2022  CBC is normal CMP shows mildly elevated creatinine which is stable, most likely due to the use of spironolactone.  Positive ANA, positive low titer antiphosphatidylserine antibody IgM noted.  Urine protein negative, double-stranded DNA negative, complements normal, sed rate normal.  Labs do not indicate a disease flare.  No change in treatment advised.  Eye exam: 01/14/2022   Current Dose per office note 02/13/2022: Plaquenil 200 mg 1 tablet by mouth twice daily  DX: Autoimmune disease   Last Fill: 01/20/2022  Okay to refill Plaquenil?

## 2022-02-19 ENCOUNTER — Encounter: Payer: Self-pay | Admitting: Rheumatology

## 2022-05-23 IMAGING — CR DG CHEST 2V
2 series · 2 of 2 positions shown · non-contrast
Comparison: None.

CLINICAL DATA: Chest pain.

EXAM:
CHEST - 2 VIEW

[w chest pa]
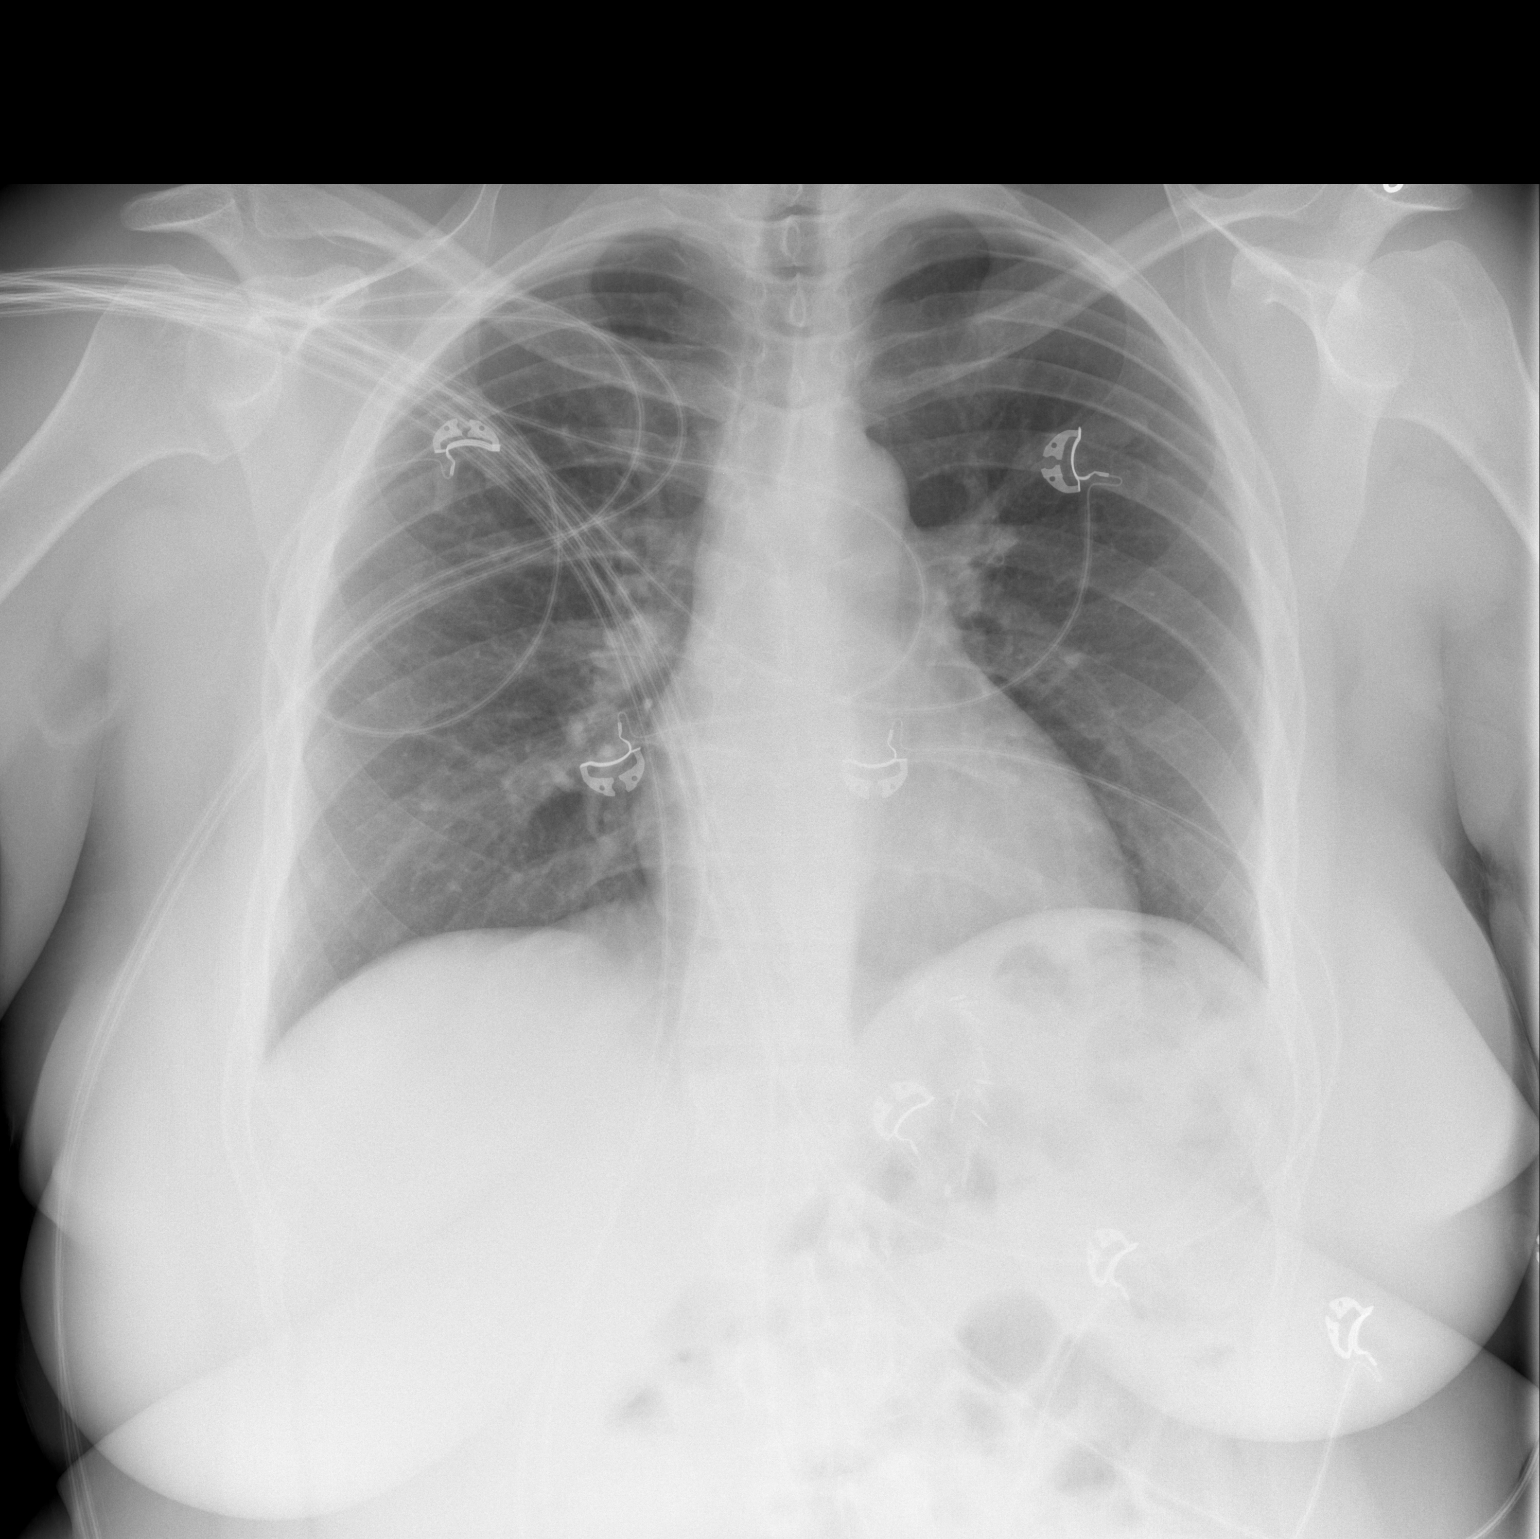

[w chest lat *]
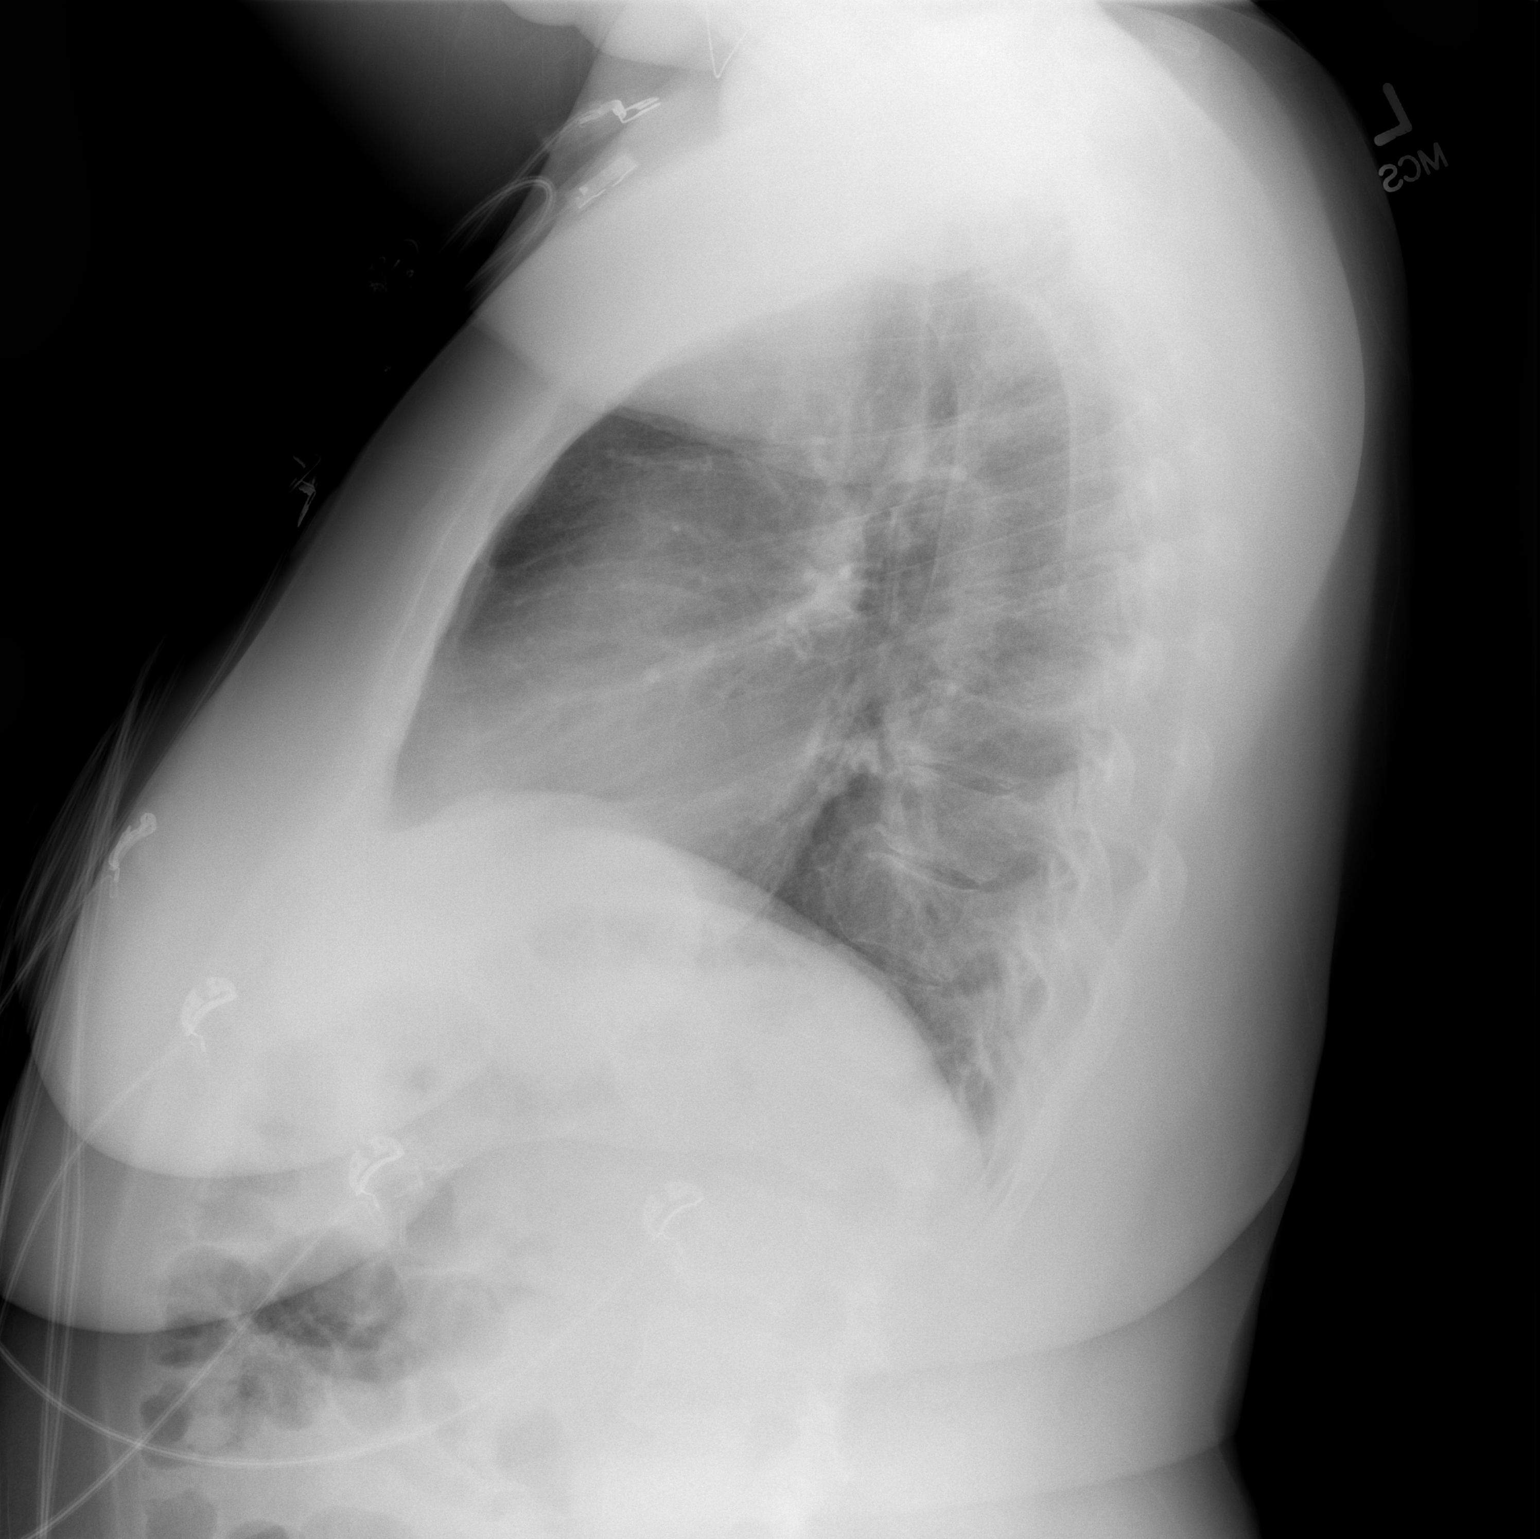

[2 of 2 positions shown; findings below may reference images not displayed]

FINDINGS: The heart size and mediastinal contours are within normal limits.
Both lungs are clear. No pneumothorax or pleural effusion is noted.
The visualized skeletal structures are unremarkable.
IMPRESSION: No active cardiopulmonary disease.

## 2022-07-03 ENCOUNTER — Other Ambulatory Visit: Payer: Self-pay | Admitting: Rheumatology

## 2022-07-03 DIAGNOSIS — M359 Systemic involvement of connective tissue, unspecified: Secondary | ICD-10-CM

## 2022-07-03 NOTE — Progress Notes (Deleted)
Office Visit Note  Patient: Whitney Harmon             Date of Birth: 1975-10-14           MRN: [id number]             PCP: Loyola Mast PA-C Referring: Annye English Visit Date: 07/17/2022 Occupation: @GUAROCC @  Subjective:  No chief complaint on file.   History of Present Illness: Whitney Harmon is a 47 y.o. female ***     Activities of Daily Living:  Patient reports morning stiffness for *** {minute/hour:19697}.   Patient {ACTIONS;DENIES/REPORTS:21021675::"Denies"} nocturnal pain.  Difficulty dressing/grooming: {ACTIONS;DENIES/REPORTS:21021675::"Denies"} Difficulty climbing stairs: {ACTIONS;DENIES/REPORTS:21021675::"Denies"} Difficulty getting out of chair: {ACTIONS;DENIES/REPORTS:21021675::"Denies"} Difficulty using hands for taps, buttons, cutlery, and/or writing: {ACTIONS;DENIES/REPORTS:21021675::"Denies"}  No Rheumatology ROS completed.   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   Obesity 04/22/2010   HYPERHIDROSIS 10/15/2009   GENITAL HERPES 09/12/2009   Anxiety and depression 06/18/2009   Essential hypertension 06/18/2009    Past Medical History:  Diagnosis Date   Asthma    childhood   Elevated antinuclear antibody (ANA) level 05/12/2011   Hypertension    Overweight(278.02)     Family History  Problem Relation Age of Onset   Arthritis Mother        osteoarthritis   Diabetes Mother    Hypertension Mother    Mental illness Mother        schizophrenia   Obesity Mother    Schizophrenia  Mother    Hypertension Father    Alcohol abuse Other    Diabetes Other    Hypertension Other    Healthy Son    Hypertension Sister    Past Surgical History:  Procedure Laterality Date   CYST EXCISION     left underarm   LAPAROSCOPIC GASTRIC SLEEVE RESECTION  12/31/2014   SALPINGOOPHORECTOMY  05/26/2005   left   Social History   Social History Narrative   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   Physical Exam   Musculoskeletal Exam: ***  CDAI Exam: CDAI Score: -- Patient Global: --; Provider Global: -- Swollen: --; Tender: -- Joint Exam 07/17/2022   No joint exam has been documented for this visit   There is currently no information documented on the homunculus. Go to the Rheumatology activity and complete the homunculus joint exam.  Investigation: No additional findings.  Imaging: No results found.  Recent Labs: Lab Results  Component Value Date   WBC 7.5 02/13/2022   HGB 12.4 02/13/2022   PLT 240 02/13/2022   NA 139 02/13/2022   K 3.8 02/13/2022   CL 101 02/13/2022   CO2 30 02/13/2022   GLUCOSE 83 02/13/2022   BUN 14 02/13/2022   CREATININE 1.08 (H) 02/13/2022   BILITOT 0.7 02/13/2022   ALKPHOS 55 12/08/2016  AST 11 02/13/2022   ALT 13 02/13/2022   PROT 6.6 02/13/2022   ALBUMIN 4.0 12/08/2016   CALCIUM 9.4 02/13/2022   GFRAA 67 11/13/2020   QFTBGOLDPLUS NEGATIVE 04/30/2021    Speciality Comments: PLQ eye exam: 01/14/2022 normal. [facility]- Dr. Alanda Slim. Follow up in 1 year.  Procedures:  No procedures performed Allergies: Fish oil, Peanut butter flavor, Peanuts [peanut oil], Apple juice, Other, and Vilazodone hcl   Assessment / Plan:     Visit Diagnoses: Autoimmune disease ([place])  High risk medication  use  Fibromyalgia  History of hidradenitis suppurativa  History of asthma  History of hypertension  History of obsessive compulsive disorder  Vitamin D deficiency  Orders: No orders of the defined types were placed in this encounter.  No orders of the defined types were placed in this encounter.   Face-to-face time spent with patient was *** minutes. Greater than 50% of time was spent in counseling and coordination of care.  Follow-Up Instructions: No follow-ups on file.   Ofilia Neas, PA-C  Note - This record has been created using Dragon software.  Chart creation errors have been sought, but may not always  have been located. Such creation errors do not reflect on  the standard of medical care.

## 2022-07-03 NOTE — Telephone Encounter (Signed)
Next Visit: 07/17/2022   Last Visit: 02/13/2022   Labs: 02/13/2022  CBC is normal CMP shows mildly elevated creatinine which is stable, most likely due to the use of spironolactone.  Positive ANA, positive low titer antiphosphatidylserine antibody IgM noted.  Urine protein negative, double-stranded DNA negative, complements normal, sed rate normal.  Labs do not indicate a disease flare.  No change in treatment advised.   Eye exam: 01/14/2022    Current Dose per office note 02/13/2022: Plaquenil 200 mg 1 tablet by mouth twice daily   DX: Autoimmune disease    Last Fill: 02/18/2022   Okay to refill Plaquenil?

## 2022-07-17 ENCOUNTER — Ambulatory Visit: Payer: No Typology Code available for payment source | Admitting: Physician Assistant

## 2022-07-17 DIAGNOSIS — Z79899 Other long term (current) drug therapy: Secondary | ICD-10-CM

## 2022-07-17 DIAGNOSIS — E559 Vitamin D deficiency, unspecified: Secondary | ICD-10-CM

## 2022-07-17 DIAGNOSIS — M797 Fibromyalgia: Secondary | ICD-10-CM

## 2022-07-17 DIAGNOSIS — M359 Systemic involvement of connective tissue, unspecified: Secondary | ICD-10-CM

## 2022-07-17 DIAGNOSIS — Z8709 Personal history of other diseases of the respiratory system: Secondary | ICD-10-CM

## 2022-07-17 DIAGNOSIS — Z8679 Personal history of other diseases of the circulatory system: Secondary | ICD-10-CM

## 2022-07-17 DIAGNOSIS — Z872 Personal history of diseases of the skin and subcutaneous tissue: Secondary | ICD-10-CM

## 2022-07-17 DIAGNOSIS — Z8659 Personal history of other mental and behavioral disorders: Secondary | ICD-10-CM

## 2022-08-06 NOTE — Progress Notes (Deleted)
Office Visit Note  Patient: Whitney Harmon             Date of Birth: Feb 13, 1976           MRN: [id number]             PCP: Annye English Referring: Annye English Visit Date: 08/20/2022 Occupation: @GUAROCC @  Subjective:    History of Present Illness: Whitney Harmon is a 47 y.o. female with history of autoimmune disease and fibromyalgia.  Patient is currently taking plaquenil 200 mg 1 tablet by mouth twice daily.  PLQ eye exam: 01/14/2022 normal. [facility]- Dr. Alanda Slim. Follow up in 1 year.   Lab work from 02/13/22 was reviewed today in the office.  Plan to obtain the following lab work for further evaluation.  She will remain on plaquenil as prescribed.  Activities of Daily Living:  Patient reports morning stiffness for *** {minute/hour:19697}.   Patient {ACTIONS;DENIES/REPORTS:21021675::"Denies"} nocturnal pain.  Difficulty dressing/grooming: {ACTIONS;DENIES/REPORTS:21021675::"Denies"} Difficulty climbing stairs: {ACTIONS;DENIES/REPORTS:21021675::"Denies"} Difficulty getting out of chair: {ACTIONS;DENIES/REPORTS:21021675::"Denies"} Difficulty using hands for taps, buttons, cutlery, and/or writing: {ACTIONS;DENIES/REPORTS:21021675::"Denies"}  No Rheumatology ROS completed.   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   Obesity 04/22/2010   HYPERHIDROSIS 10/15/2009   GENITAL HERPES 09/12/2009   Anxiety and depression 06/18/2009   Essential hypertension 06/18/2009    Past Medical History:   Diagnosis Date   Asthma    childhood   Elevated antinuclear antibody (ANA) level 05/12/2011   Hypertension    Overweight(278.02)     Family History  Problem Relation Age of Onset   Arthritis Mother        osteoarthritis   Diabetes Mother    Hypertension Mother    Mental illness Mother        schizophrenia   Obesity Mother    Schizophrenia Mother    Hypertension Father    Alcohol abuse Other    Diabetes Other    Hypertension Other    Healthy Son    Hypertension Sister    Past Surgical History:  Procedure Laterality Date   CYST EXCISION     left underarm   LAPAROSCOPIC GASTRIC SLEEVE RESECTION  12/31/2014   SALPINGOOPHORECTOMY  05/26/2005   left   Social History   Social History Narrative   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   Physical Exam Vitals and nursing note reviewed.  Constitutional:      Appearance: She is well-developed.  HENT:     Head: Normocephalic and atraumatic.  Eyes:     Conjunctiva/sclera: Conjunctivae normal.  Cardiovascular:     Rate and Rhythm: Normal rate and regular rhythm.     Heart sounds: Normal heart sounds.  Pulmonary:     Effort: Pulmonary effort is normal.     Breath sounds: Normal breath sounds.  Abdominal:     General: Bowel sounds are normal.     Palpations: Abdomen is soft.  Musculoskeletal:     Cervical back: Normal range of motion.  Lymphadenopathy:     Cervical: No cervical adenopathy.  Skin:    General: Skin is warm and dry.     Capillary Refill: Capillary refill takes less than 2 seconds.  Neurological:     Mental Status: She is alert and oriented to person, place, and time.  Psychiatric:        Behavior: Behavior normal.      Musculoskeletal Exam: ***  CDAI Exam: CDAI Score:  -- Patient Global: --; Provider Global: -- Swollen: --; Tender: -- Joint Exam 08/20/2022   No joint exam has been documented for this visit   There is currently no information documented on the homunculus. Go to the Rheumatology activity and complete the homunculus joint exam.  Investigation: No additional findings.  Imaging: No results found.  Recent Labs: Lab Results  Component Value Date   WBC 7.5 02/13/2022   HGB 12.4 02/13/2022   PLT 240 02/13/2022   NA 139 02/13/2022   K 3.8 02/13/2022   CL 101 02/13/2022   CO2 30 02/13/2022   GLUCOSE 83 02/13/2022   BUN 14 02/13/2022   CREATININE 1.08 (H) 02/13/2022   BILITOT 0.7 02/13/2022   ALKPHOS 55 12/08/2016   AST 11 02/13/2022   ALT 13 02/13/2022   PROT 6.6 02/13/2022   ALBUMIN 4.0 12/08/2016   CALCIUM 9.4 02/13/2022   GFRAA 67 11/13/2020   QFTBGOLDPLUS NEGATIVE 04/30/2021    Speciality Comments: PLQ eye exam: 01/14/2022 normal. [facility]- Dr. Alanda Slim. Follow up in 1 year.  Procedures:  No procedures performed Allergies: Fish oil, Peanut butter flavor, Peanuts [peanut oil], Apple juice, Other, and Vilazodone hcl   Assessment / Plan:     Visit Diagnoses: Autoimmune disease ([place])  High risk medication use  Fibromyalgia  Chronic fatigue  History of hidradenitis suppurativa  History of asthma  History of hypertension  History of obsessive compulsive disorder  Vitamin D deficiency  Orders: No orders of the defined types were placed in this encounter.  No orders of the defined types were placed in this encounter.   Face-to-face time spent with patient was *** minutes. Greater than 50% of time was spent in counseling and coordination of care.  Follow-Up Instructions: No follow-ups on file.   Ofilia Neas, PA-C  Note - This record has been created using Dragon software.  Chart creation errors have been sought, but may not always  have been located. Such creation errors do not reflect on   the standard of medical care.

## 2022-08-20 ENCOUNTER — Ambulatory Visit: Payer: Self-pay | Admitting: Physician Assistant

## 2022-08-20 DIAGNOSIS — Z8679 Personal history of other diseases of the circulatory system: Secondary | ICD-10-CM

## 2022-08-20 DIAGNOSIS — Z8709 Personal history of other diseases of the respiratory system: Secondary | ICD-10-CM

## 2022-08-20 DIAGNOSIS — E559 Vitamin D deficiency, unspecified: Secondary | ICD-10-CM

## 2022-08-20 DIAGNOSIS — M359 Systemic involvement of connective tissue, unspecified: Secondary | ICD-10-CM

## 2022-08-20 DIAGNOSIS — R5382 Chronic fatigue, unspecified: Secondary | ICD-10-CM

## 2022-08-20 DIAGNOSIS — Z8659 Personal history of other mental and behavioral disorders: Secondary | ICD-10-CM

## 2022-08-20 DIAGNOSIS — M797 Fibromyalgia: Secondary | ICD-10-CM

## 2022-08-20 DIAGNOSIS — Z872 Personal history of diseases of the skin and subcutaneous tissue: Secondary | ICD-10-CM

## 2022-08-20 DIAGNOSIS — Z79899 Other long term (current) drug therapy: Secondary | ICD-10-CM

## 2022-08-26 NOTE — Progress Notes (Unsigned)
Office Visit Note  Patient: Whitney Harmon             Date of Birth: 04/04/1976           MRN: [id number]             PCP: Bryon Lions, PA-C Referring: Bryon Lions, PA-C Visit Date: 09/09/2022 Occupation: @  Subjective:  Fatigue and arthralgias  History of Present Illness: Whitney Harmon is a 47 y.o. female with history of autoimmune disease and fibromyalgia.  Patient is taking Plaquenil 200 mg 1 tablet by mouth twice daily.  She has been tolerating Plaquenil without any side effects.  Patient reports that she was started on Humira 40 mg subcu injections every 14 days by her dermatologist for management of at bedtime.  She is unsure of the exact time that she was started on Humira but it was likely in summer 2024.  She states that her at bedtime has been better controlled since initiating Humira.  She states that she has had increased fatigue and arthralgias and is unsure if it is due to her underlying autoimmune disease or medications.  She has had intermittent bouts of pain in her wrist joints and ankle joints.  At times she is having to limp due to the discomfort in her ankles.  She has been unable to identify a trigger for these flares.  She states she has also had intermittent discomfort in her knee joints.  She has not missed any doses of Plaquenil or Humira recently. She has had ongoing sicca symptoms and has intermittent sores in her mouth.  Activities of Daily Living:  Patient reports morning stiffness for 30 minutes.   Patient Reports nocturnal pain.  Difficulty dressing/grooming: Denies Difficulty climbing stairs: Denies Difficulty getting out of chair: Denies Difficulty using hands for taps, buttons, cutlery, and/or writing: Reports  Review of Systems  Constitutional:  Positive for fatigue.  HENT:  Positive for mouth sores and mouth dryness.   Eyes:  Positive for dryness.  Respiratory:  Positive for shortness of breath.   Cardiovascular:  Negative for  chest pain and palpitations.  Gastrointestinal:  Positive for constipation. Negative for blood in stool and diarrhea.  Endocrine: Negative for increased urination.  Genitourinary:  Negative for involuntary urination.  Musculoskeletal:  Positive for joint pain, gait problem, joint pain, myalgias, morning stiffness, muscle tenderness and myalgias. Negative for joint swelling and muscle weakness.  Skin:  Positive for hair loss and sensitivity to sunlight. Negative for color change and rash.  Allergic/Immunologic: Negative for susceptible to infections.  Neurological:  Positive for dizziness. Negative for headaches.  Hematological:  Negative for swollen glands.  Psychiatric/Behavioral:  Positive for sleep disturbance. Negative for depressed mood. The patient is nervous/anxious.     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   Obesity 04/22/2010   HYPERHIDROSIS 10/15/2009   GENITAL HERPES 09/12/2009   Anxiety and depression 06/18/2009   Essential hypertension 06/18/2009    Past Medical History:  Diagnosis Date   Asthma    childhood  Elevated antinuclear antibody (ANA) level 05/12/2011   Hypertension    Overweight(278.02)     Family History  Problem Relation Age of Onset   Arthritis Mother        osteoarthritis   Diabetes Mother    Hypertension Mother    Mental illness Mother        schizophrenia   Obesity Mother    Schizophrenia Mother    Hypertension Father    Alcohol abuse Other    Diabetes Other    Hypertension Other     Healthy Son    Hypertension Sister    Past Surgical History:  Procedure Laterality Date   CYST EXCISION     left underarm   LAPAROSCOPIC GASTRIC SLEEVE RESECTION  12/31/2014   SALPINGOOPHORECTOMY  05/26/2005   left   Social History   Social History Narrative   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  Constitutional:      Appearance: She is well-developed.  HENT:     Head: Normocephalic and atraumatic.  Eyes:     Conjunctiva/sclera: Conjunctivae normal.  Cardiovascular:     Rate and Rhythm: Normal rate and regular rhythm.     Heart sounds: Normal heart sounds.  Pulmonary:     Effort: Pulmonary effort is normal.     Breath sounds: Normal breath sounds.  Abdominal:     General: Bowel sounds are normal.     Palpations: Abdomen is soft.  Musculoskeletal:     Cervical back: Normal range of motion.  Lymphadenopathy:     Cervical: No cervical adenopathy.  Skin:    General: Skin is warm and dry.     Capillary Refill: Capillary refill takes less than 2 seconds.  Neurological:     Mental Status: She is alert and oriented to person, place, and time.  Psychiatric:        Behavior: Behavior normal.      Musculoskeletal Exam: C-spine, thoracic spine, and lumbar spine good ROM.  Shoulder joints, elbow joints, wrist joints, MCPs, PIPs, and DIPs good ROM with no synovitis.  Compete fist formation bilaterally.  Hip joints, knee joints, and ankle joints have good ROM with no discomfort.  No warmth or effusion of knee joints.  No tenderness or swelling of ankle joints. No  tenderness or synovitis of MTP joints.   CDAI Exam: CDAI Score: -- Patient Global: --; Provider Global: -- Swollen: --; Tender: -- Joint Exam 09/09/2022   No joint exam has been documented for this visit   There is currently no information documented on the homunculus. Go to the Rheumatology activity and complete the homunculus joint exam.  Investigation: No additional findings.  Imaging: No results found.  Recent Labs: Lab Results  Component Value Date   WBC 7.5 02/13/2022   HGB 12.4 02/13/2022   PLT 240 02/13/2022   NA 139 02/13/2022   K 3.8 02/13/2022   CL 101 02/13/2022   CO2 30 02/13/2022   GLUCOSE 83 02/13/2022   BUN 14 02/13/2022   CREATININE 1.08 (H) 02/13/2022  BILITOT 0.7 02/13/2022   ALKPHOS 55 12/08/2016   AST 11 02/13/2022   ALT 13 02/13/2022   PROT 6.6 02/13/2022   ALBUMIN 4.0 12/08/2016   CALCIUM 9.4 02/13/2022   GFRAA 67 11/13/2020   QFTBGOLDPLUS NEGATIVE 04/30/2021    Speciality Comments: PLQ eye exam: 01/14/2022 normal. [facility]- Dr. Genia Del. Follow up in 1 year.  Procedures:  No procedures performed Allergies: Fish oil, Peanut butter flavor, Peanuts [peanut oil], Apple juice, Other, and Vilazodone hcl       Assessment / Plan:     Visit Diagnoses: Autoimmune disease - AVISE positive 1,1: ANA 1:640H, rheumatoid factor IgMequivocal,+Antiphosphatidylserine/prothrombin IgM, dsDNA-, complements WNL:  She remains on Plaquenil 200 mg 1 tablet by mouth twice daily.  She is tolerating Plaquenil without any side effects.  She had initially noticed improvement in her fatigue and arthralgias after initiating Plaquenil.  Recently she has had an increase in her symptoms.  Of note she has been started on Humira (summer 2024) for management of HS by her dermatologist.   She is unsure of the worsening of her symptoms is due to medication change versus the underlying autoimmune disease. She has been experiencing intermittent bouts of fatigue and  increased arthralgias in both wrist and both ankle joints.  At times she has had a limp due to the severity of pain in her ankles.  No synovitis was noted on examination today.  She requested a handicap placard since she has difficulty standing or walking for long distances during these episodes.  Paperwork for temporary handicap lacquer were provided today. She has also been experiencing increased sicca symptoms.  Discussed the use of both refresh and Systane eyedrops as well as Biotene products and XyliMelts for symptomatic relief. Lab work from 02/13/22 was reviewed today in the office: ANA 1: 320 NH, phosphatidylserine IgM 35, ESR 2, complements within normal limits, double-stranded DNA less than 1, normal protein creatinine ratio.  The following lab work will be obtained today for further evaluation.  She will remain on Plaquenil as prescribed.  She was advised to notify us if she develops signs or symptoms of a flare.  She will follow-up in the office in 5 months or sooner if needed. - Plan: Protein / creatinine ratio, urine, CBC with Differential/Platelet, COMPLETE METABOLIC PANEL WITH GFR, ANA, Anti-DNA antibody, double-stranded, C3 and C4, Sedimentation rate, Phosphatidylserine/Prothrombin (PS/PT) Antibodies (IgG, IgM), Anti-Smith antibody  High risk medication use - Plaquenil 200 mg 1 tablet by mouth twice daily.  CBC and CMP updated on 02/13/22.  Orders for CBC and CMP released today.  PLQ eye exam: 01/14/2022 normal. [facility]- Dr. Genia Del. Follow up in 1 year .    - Plan: CBC with Differential/Platelet, COMPLETE METABOLIC PANEL WITH GFR  Fibromyalgia: She has been experiencing increased myalgias and muscle tenderness due to fibromyalgia.  She does not feel that her pain level has been adequately controlled.  Her symptoms seem consistent with myofascial pain.  She has a prescription for methocarbamol 500 mg 1 tablet daily as needed during flares.  She has been having increased fatigue  intermittently.  Discussed the importance of regular exercise and good sleep hygiene.  Chronic fatigue: She has been experiencing intermittent bouts of fatigue.  The following lab work was updated today.   History of hidradenitis suppurativa: Under the care of dermatology at [facility].  Started on Humira 40 mg sq injections every 14 days.  Other medical conditions are listed as follows:  History of asthma  History of hypertension: Blood pressure was 100/68 today in the office.  History of obsessive compulsive disorder  Vitamin D deficiency  Orders: Orders Placed This Encounter  Procedures   Protein / creatinine ratio, urine   CBC with Differential/Platelet   COMPLETE METABOLIC PANEL WITH GFR   ANA   Anti-DNA antibody, double-stranded   C3 and C4   Sedimentation rate   Phosphatidylserine/Prothrombin (PS/PT) Antibodies (IgG, IgM)   Anti-Smith antibody   No orders of the defined types were placed in this encounter.     Follow-Up Instructions: Return in about 5 months (around 02/09/2023) for Autoimmune Disease, Fibromyalgia.   Gearldine Bienenstock, PA-C  Note - This record has been created using Dragon software.  Chart creation errors have been sought, but may not always  have been located. Such creation errors do not reflect on  the standard of medical care.

## 2022-09-09 ENCOUNTER — Ambulatory Visit: Payer: No Typology Code available for payment source | Attending: Physician Assistant | Admitting: Physician Assistant

## 2022-09-09 ENCOUNTER — Encounter: Payer: Self-pay | Admitting: Physician Assistant

## 2022-09-09 VITALS — BP 100/68 | HR 68 | Ht 66.5 in | Wt 187.0 lb

## 2022-09-09 DIAGNOSIS — Z8659 Personal history of other mental and behavioral disorders: Secondary | ICD-10-CM

## 2022-09-09 DIAGNOSIS — M797 Fibromyalgia: Secondary | ICD-10-CM

## 2022-09-09 DIAGNOSIS — E559 Vitamin D deficiency, unspecified: Secondary | ICD-10-CM

## 2022-09-09 DIAGNOSIS — R5382 Chronic fatigue, unspecified: Secondary | ICD-10-CM | POA: Diagnosis not present

## 2022-09-09 DIAGNOSIS — Z79899 Other long term (current) drug therapy: Secondary | ICD-10-CM

## 2022-09-09 DIAGNOSIS — Z872 Personal history of diseases of the skin and subcutaneous tissue: Secondary | ICD-10-CM

## 2022-09-09 DIAGNOSIS — Z8709 Personal history of other diseases of the respiratory system: Secondary | ICD-10-CM

## 2022-09-09 DIAGNOSIS — Z8679 Personal history of other diseases of the circulatory system: Secondary | ICD-10-CM

## 2022-09-09 DIAGNOSIS — M359 Systemic involvement of connective tissue, unspecified: Secondary | ICD-10-CM | POA: Diagnosis not present

## 2022-09-11 NOTE — Progress Notes (Signed)
dsDNA is negative. Smith negative.  Complements WNL.  ESR WNL.  CBC stable. Creatinine is remains slightly elevated-1.11.  avoid the use of NSAIDs and remain hydrated.  Alk phos is borderline low-stable. Rest of CMP WNL

## 2022-09-13 LAB — ANTI-NUCLEAR AB-TITER (ANA TITER): ANA Titer 1: 1:320 {titer} — ABNORMAL HIGH

## 2022-09-13 LAB — SEDIMENTATION RATE: Sed Rate: 2 mm/h (ref 0–20)

## 2022-09-15 LAB — CBC WITH DIFFERENTIAL/PLATELET
Absolute Monocytes: 331 cells/uL (ref 200–950)
Basophils Absolute: 48 cells/uL (ref 0–200)
Basophils Relative: 0.7 %
Eosinophils Absolute: 110 cells/uL (ref 15–500)
Eosinophils Relative: 1.6 %
HCT: 39.5 % (ref 35.0–45.0)
Hemoglobin: 12.7 g/dL (ref 11.7–15.5)
Lymphs Abs: 3940 cells/uL — ABNORMAL HIGH (ref 850–3900)
MCH: 25.2 pg — ABNORMAL LOW (ref 27.0–33.0)
MCHC: 32.2 g/dL (ref 32.0–36.0)
MCV: 78.4 fL — ABNORMAL LOW (ref 80.0–100.0)
MPV: 11 fL (ref 7.5–12.5)
Monocytes Relative: 4.8 %
Neutro Abs: 2470 cells/uL (ref 1500–7800)
Neutrophils Relative %: 35.8 %
Platelets: 332 10*3/uL (ref 140–400)
RBC: 5.04 10*6/uL (ref 3.80–5.10)
RDW: 13.2 % (ref 11.0–15.0)
Total Lymphocyte: 57.1 %
WBC: 6.9 10*3/uL (ref 3.8–10.8)

## 2022-09-15 LAB — ANA: Anti Nuclear Antibody (ANA): POSITIVE — AB

## 2022-09-15 LAB — COMPLETE METABOLIC PANEL WITH GFR
AG Ratio: 1.6 (calc) (ref 1.0–2.5)
ALT: 6 U/L (ref 6–29)
AST: 10 U/L (ref 10–30)
Albumin: 4.1 g/dL (ref 3.6–5.1)
Alkaline phosphatase (APISO): 27 U/L — ABNORMAL LOW (ref 31–125)
BUN/Creatinine Ratio: 13 (calc) (ref 6–22)
BUN: 14 mg/dL (ref 7–25)
CO2: 28 mmol/L (ref 20–32)
Calcium: 9.3 mg/dL (ref 8.6–10.2)
Chloride: 102 mmol/L (ref 98–110)
Creat: 1.11 mg/dL — ABNORMAL HIGH (ref 0.50–0.99)
Globulin: 2.6 g/dL (calc) (ref 1.9–3.7)
Glucose, Bld: 75 mg/dL (ref 65–99)
Potassium: 4.1 mmol/L (ref 3.5–5.3)
Sodium: 137 mmol/L (ref 135–146)
Total Bilirubin: 0.5 mg/dL (ref 0.2–1.2)
Total Protein: 6.7 g/dL (ref 6.1–8.1)
eGFR: 63 mL/min/{1.73_m2} (ref >=60)

## 2022-09-15 LAB — ANTI-DNA ANTIBODY, DOUBLE-STRANDED: ds DNA Ab: 1 IU/mL

## 2022-09-15 LAB — PHOSPHATIDYLSERINE/PROTHROMBIN (PS/PT) ANTIBODIES (IGG, IGM)
Phosphatidylserine/Prothrombin Ab (IgG): 12 U (ref <=30)
Phosphatidylserine/Prothrombin Ab (IgM): 23 U (ref <=30)

## 2022-09-15 LAB — ANTI-SMITH ANTIBODY: ENA SM Ab Ser-aCnc: <1 AI

## 2022-09-15 LAB — C3 AND C4
C3 Complement: 106 mg/dL (ref 83–193)
C4 Complement: 22 mg/dL (ref 15–57)

## 2022-09-15 NOTE — Progress Notes (Signed)
ANA remains positive-titer is unchanged.  Phosphatidylserine antibodies negative.

## 2022-09-18 ENCOUNTER — Other Ambulatory Visit: Payer: Self-pay | Admitting: Family

## 2022-09-18 DIAGNOSIS — N6320 Unspecified lump in the left breast, unspecified quadrant: Secondary | ICD-10-CM

## 2022-09-27 ENCOUNTER — Other Ambulatory Visit: Payer: Self-pay | Admitting: Rheumatology

## 2022-09-27 DIAGNOSIS — M359 Systemic involvement of connective tissue, unspecified: Secondary | ICD-10-CM

## 2022-09-29 NOTE — Telephone Encounter (Signed)
Last Fill: 07/03/2022  Eye exam: 01/14/2022 normal.    Labs: 09/09/2022 CBC stable. Creatinine is remains slightly elevated-1.11. Alk phos is borderline low-stable. Rest of CMP WNL   Next Visit: 02/11/2023  Last Visit: 09/09/2022  DX:Autoimmune disease   Current Dose per office note 09/09/2022: Plaquenil 200 mg 1 tablet by mouth twice daily.   Okay to refill Plaquenil?

## 2022-10-08 ENCOUNTER — Other Ambulatory Visit: Payer: 59

## 2022-10-29 ENCOUNTER — Ambulatory Visit
Admission: RE | Admit: 2022-10-29 | Discharge: 2022-10-29 | Disposition: A | Discharge status: Home / Self Care | Payer: 59 | Source: Ambulatory Visit | Attending: Family | Admitting: Family

## 2022-10-29 DIAGNOSIS — N6320 Unspecified lump in the left breast, unspecified quadrant: Secondary | ICD-10-CM

## 2022-12-31 ENCOUNTER — Other Ambulatory Visit: Payer: Self-pay | Admitting: Physician Assistant

## 2022-12-31 DIAGNOSIS — M359 Systemic involvement of connective tissue, unspecified: Secondary | ICD-10-CM

## 2022-12-31 NOTE — Telephone Encounter (Signed)
Last Fill: 09/29/2022  Eye exam: 01/14/2022 normal.   Labs: 09/09/2022 CBC stable. Creatinine is remains slightly elevated-1.11. Alk phos is borderline low-stable. Rest of CMP WNL   Next Visit: 02/11/2023  Last Visit: 09/09/2022  DX:Autoimmune disease   Current Dose per office note 09/09/2022: Plaquenil 200 mg 1 tablet by mouth twice daily.   Okay to refill Plaquenil?

## 2023-01-28 NOTE — Progress Notes (Deleted)
Office Visit Note  Patient: Whitney Harmon             Date of Birth: Sep 27, 1975           MRN: [id number]             PCP: Bryon Lions, PA-C Referring: Bryon Lions, PA-C Visit Date: 02/11/2023 Occupation: @GUAROCC @  Subjective:    History of Present Illness: DEICI PATIENT is a 47 y.o. female with history of autoimmune disease and fibromyalgia.  Patient remains on plaquenil 200 mg 1 tablet by mouth twice daily.      PLQ eye exam: 01/14/2022 normal. [facility]- Dr. Genia Del.  CBC and CMP updated on 09/09/22.  Orders for CBC and CMP released today.     Activities of Daily Living:  Patient reports morning stiffness for *** {minute/hour:19697}.   Patient {ACTIONS;DENIES/REPORTS:21021675::"Denies"} nocturnal pain.  Difficulty dressing/grooming: {ACTIONS;DENIES/REPORTS:21021675::"Denies"} Difficulty climbing stairs: {ACTIONS;DENIES/REPORTS:21021675::"Denies"} Difficulty getting out of chair: {ACTIONS;DENIES/REPORTS:21021675::"Denies"} Difficulty using hands for taps, buttons, cutlery, and/or writing: {ACTIONS;DENIES/REPORTS:21021675::"Denies"}  No Rheumatology ROS completed.   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   Obesity 04/22/2010   HYPERHIDROSIS 10/15/2009   GENITAL HERPES 09/12/2009   Anxiety and depression 06/18/2009   Essential hypertension 06/18/2009    Past Medical History:  Diagnosis Date   Asthma    childhood   Elevated antinuclear antibody (ANA) level 05/12/2011   Hypertension     Overweight(278.02)     Family History  Problem Relation Age of Onset   Arthritis Mother        osteoarthritis   Diabetes Mother    Hypertension Mother    Mental illness Mother        schizophrenia   Obesity Mother    Schizophrenia Mother    Hypertension Father    Alcohol abuse Other    Diabetes Other    Hypertension Other    Healthy Son    Hypertension Sister    Past Surgical History:  Procedure Laterality Date   CYST EXCISION     left underarm   LAPAROSCOPIC GASTRIC SLEEVE RESECTION  12/31/2014   SALPINGOOPHORECTOMY  05/26/2005   left   Social History   Social History Narrative   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   Physical Exam Vitals and nursing note reviewed.  Constitutional:      Appearance: She is well-developed.  HENT:     Head: Normocephalic and atraumatic.  Eyes:     Conjunctiva/sclera: Conjunctivae normal.  Cardiovascular:     Rate and Rhythm: Normal rate and regular rhythm.     Heart sounds: Normal heart sounds.  Pulmonary:     Effort: Pulmonary effort is normal.     Breath sounds: Normal breath sounds.  Abdominal:     General: Bowel sounds are normal.  Palpations: Abdomen is soft.  Musculoskeletal:     Cervical back: Normal range of motion.  Lymphadenopathy:     Cervical: No cervical adenopathy.  Skin:    General: Skin is warm and dry.     Capillary Refill: Capillary refill takes less than 2 seconds.  Neurological:     Mental Status: She is alert and oriented to person, place, and time.  Psychiatric:        Behavior: Behavior normal.      Musculoskeletal Exam: ***  CDAI Exam: CDAI Score: -- Patient Global: --; Provider Global: -- Swollen: --; Tender: -- Joint Exam 02/11/2023   No joint exam has  been documented for this visit   There is currently no information documented on the homunculus. Go to the Rheumatology activity and complete the homunculus joint exam.  Investigation: No additional findings.  Imaging: No results found.  Recent Labs: Lab Results  Component Value Date   WBC 6.9 09/09/2022   HGB 12.7 09/09/2022   PLT 332 09/09/2022   NA 137 09/09/2022   K 4.1 09/09/2022   CL 102 09/09/2022   CO2 28 09/09/2022   GLUCOSE 75 09/09/2022   BUN 14 09/09/2022   CREATININE 1.11 (H) 09/09/2022   BILITOT 0.5 09/09/2022   ALKPHOS 55 12/08/2016   AST 10 09/09/2022   ALT 6 09/09/2022   PROT 6.7 09/09/2022   ALBUMIN 4.0 12/08/2016   CALCIUM 9.3 09/09/2022   GFRAA 67 11/13/2020   QFTBGOLDPLUS NEGATIVE 04/30/2021    Speciality Comments: PLQ eye exam: 01/14/2022 normal. [facility]- Dr. Genia Del. Follow up in 1 year.  Procedures:  No procedures performed Allergies: Fish oil, Peanut butter flavor, Peanuts [peanut oil], Apple juice, Other, and Vilazodone hcl   Assessment / Plan:     Visit Diagnoses: Autoimmune disease (HCC)  High risk medication use  Fibromyalgia  Chronic fatigue  History of hidradenitis suppurativa  History of asthma  History of hypertension  History of obsessive compulsive disorder  Vitamin D deficiency  Orders: No orders of the defined types were placed in this encounter.  No orders of the defined types were placed in this encounter.   Face-to-face time spent with patient was *** minutes. Greater than 50% of time was spent in counseling and coordination of care.  Follow-Up Instructions: No follow-ups on file.   Gearldine Bienenstock, PA-C  Note - This record has been created using Dragon software.  Chart creation errors have been sought, but may not always  have been located. Such creation errors do not reflect on  the standard of medical care.

## 2023-02-11 ENCOUNTER — Ambulatory Visit: Payer: No Typology Code available for payment source | Admitting: Physician Assistant

## 2023-02-11 DIAGNOSIS — Z79899 Other long term (current) drug therapy: Secondary | ICD-10-CM

## 2023-02-11 DIAGNOSIS — Z872 Personal history of diseases of the skin and subcutaneous tissue: Secondary | ICD-10-CM

## 2023-02-11 DIAGNOSIS — R5382 Chronic fatigue, unspecified: Secondary | ICD-10-CM

## 2023-02-11 DIAGNOSIS — M359 Systemic involvement of connective tissue, unspecified: Secondary | ICD-10-CM

## 2023-02-11 DIAGNOSIS — E559 Vitamin D deficiency, unspecified: Secondary | ICD-10-CM

## 2023-02-11 DIAGNOSIS — M797 Fibromyalgia: Secondary | ICD-10-CM

## 2023-02-11 DIAGNOSIS — Z8679 Personal history of other diseases of the circulatory system: Secondary | ICD-10-CM

## 2023-02-11 DIAGNOSIS — Z8659 Personal history of other mental and behavioral disorders: Secondary | ICD-10-CM

## 2023-02-11 DIAGNOSIS — Z8709 Personal history of other diseases of the respiratory system: Secondary | ICD-10-CM

## 2023-03-03 NOTE — Progress Notes (Deleted)
Office Visit Note  Patient: Whitney Harmon             Date of Birth: 11/25/1975           MRN: [id number]             PCP: Bryon Lions, PA-C Referring: Bryon Lions, PA-C Visit Date: 03/17/2023 Occupation: @GUAROCC @  Subjective:    History of Present Illness: [name] is a 47 y.o. female with history of autoimmune disease and fibromyalgia.  Patient remains on plaquenil 200 mg 1 tablet by mouth twice daily.      PLQ eye exam: 01/14/2022 normal. [facility]- Dr. Genia Del.  CBC and CMP updated on 09/09/22.  Orders for CBC and CMP released today.     Activities of Daily Living:  Patient reports morning stiffness for *** {minute/hour:19697}.   Patient {ACTIONS;DENIES/REPORTS:21021675::"Denies"} nocturnal pain.  Difficulty dressing/grooming: {ACTIONS;DENIES/REPORTS:21021675::"Denies"} Difficulty climbing stairs: {ACTIONS;DENIES/REPORTS:21021675::"Denies"} Difficulty getting out of chair: {ACTIONS;DENIES/REPORTS:21021675::"Denies"} Difficulty using hands for taps, buttons, cutlery, and/or writing: {ACTIONS;DENIES/REPORTS:21021675::"Denies"}  No Rheumatology ROS completed.   PMFS History:  Patient Active Problem List   Diagnosis Date Noted  . Fibromyalgia 12/06/2016  . Vitamin D deficiency 12/06/2016  . History of asthma 12/06/2016  . Preventative health care 02/19/2015  . ANA positive 08/29/2014  . Bilateral leg edema 01/14/2014  . OCD (obsessive compulsive disorder) 12/26/2013  . Leg wound, left 03/23/2013  . Elevated antinuclear antibody (ANA) level 05/12/2011  . Hidradenitis 02/06/2011  . Acne 09/20/2010  . Weight gain 09/20/2010  . 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 Obesity 04/22/2010  . HYPERHIDROSIS 10/15/2009  . Genital herpes 09/12/2009  . Anxiety and depression 06/18/2009  . Essential hypertension 06/18/2009    Past Medical History:  Diagnosis Date  . Asthma    childhood  . Elevated antinuclear antibody (ANA) level 05/12/2011  .  Hypertension   . Overweight(278.02)     Family History  Problem Relation Age of Onset  . Arthritis Mother        osteoarthritis  . Diabetes Mother   . Hypertension Mother   . Mental illness Mother        schizophrenia  . Obesity Mother   . Schizophrenia Mother   . Hypertension Father   . Alcohol abuse Other   . Diabetes Other   . Hypertension Other   . Healthy Son   . Hypertension Sister    Past Surgical History:  Procedure Laterality Date  . CYST EXCISION     left underarm  . LAPAROSCOPIC GASTRIC SLEEVE RESECTION  12/31/2014  . 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 PFIZER(Purple Top)SARS-COV-2 Vaccination 11/21/2019, 12/19/2019  . PPD Test 04/05/2015  . Tdap 03/13/2013     Objective: Vital Signs: There were no vitals taken for this visit.   Physical Exam Vitals and nursing note reviewed.  Constitutional:      Appearance: She is well-developed.  HENT:     Head: Normocephalic and atraumatic.  Eyes:     Conjunctiva/sclera: Conjunctivae normal.  Cardiovascular:     Rate and Rhythm: Normal rate and regular rhythm.     Heart sounds: Normal heart sounds.  Pulmonary:     Effort: Pulmonary effort is normal.     Breath sounds: Normal breath sounds.  Abdominal:     General: Bowel sounds are normal.  Palpations: Abdomen is soft.  Musculoskeletal:     Cervical back: Normal range of motion.  Lymphadenopathy:     Cervical: No cervical adenopathy.  Skin:    General: Skin is warm and dry.     Capillary Refill: Capillary refill takes less than 2 seconds.  Neurological:     Mental Status: She is alert and oriented to person, place, and time.  Psychiatric:        Behavior: Behavior normal.     Musculoskeletal Exam: ***  CDAI Exam: CDAI Score: -- Patient Global: --; Provider Global: -- Swollen: --; Tender: -- Joint Exam  03/17/2023   No joint exam has been documented for this visit   There is currently no information documented on the homunculus. Go to the Rheumatology activity and complete the homunculus joint exam.  Investigation: No additional findings.  Imaging: No results found.  Recent Labs: Lab Results  Component Value Date   WBC 6.9 09/09/2022   HGB 12.7 09/09/2022   PLT 332 09/09/2022   NA 137 09/09/2022   K 4.1 09/09/2022   CL 102 09/09/2022   CO2 28 09/09/2022   GLUCOSE 75 09/09/2022   BUN 14 09/09/2022   CREATININE 1.11 (H) 09/09/2022   BILITOT 0.5 09/09/2022   ALKPHOS 55 12/08/2016   AST 10 09/09/2022   ALT 6 09/09/2022   PROT 6.7 09/09/2022   ALBUMIN 4.0 12/08/2016   CALCIUM 9.3 09/09/2022   GFRAA 67 11/13/2020   QFTBGOLDPLUS NEGATIVE 04/30/2021    Speciality Comments: PLQ eye exam: 01/14/2022 normal. [facility]- Dr. Genia Del. Follow up in 1 year.  Procedures:  No procedures performed Allergies: Fish oil, Peanut butter flavor, Peanuts [peanut oil], Apple juice, Other, and Vilazodone hcl   Assessment / Plan:     Visit Diagnoses: Autoimmune disease (HCC)  High risk medication use  Fibromyalgia  Chronic fatigue  History of hidradenitis suppurativa  History of asthma  History of hypertension  History of obsessive compulsive disorder  Vitamin D deficiency  Orders: No orders of the defined types were placed in this encounter.  No orders of the defined types were placed in this encounter.   Face-to-face time spent with patient was *** minutes. Greater than 50% of time was spent in counseling and coordination of care.  Follow-Up Instructions: No follow-ups on file.   Gearldine Bienenstock, PA-C  Note - This record has been created using Dragon software.  Chart creation errors have been sought, but may not always  have been located. Such creation errors do not reflect on  the standard of medical care.

## 2023-03-11 NOTE — Progress Notes (Unsigned)
Office Visit Note  Patient: Whitney Harmon             Date of Birth: 01-07-1976           MRN: [id number]             PCP: Bryon Lions, PA-C Referring: Rosemary Holms Visit Date: 03/25/2023 Occupation: @GUAROCC @  Subjective:  Pain in both knees   History of Present Illness: Whitney Harmon is a 47 y.o. female with history of autoimmune disease and fibromyalgia.  Patient is prescribed plaquenil 200 mg 1 tablet by mouth twice daily but states she has only been taking plaquenil once daily.  She is tolerating Plaquenil without any side effects.  Patient is prescribed Humira 40 mg sq injections once weekly by dermatology for management of HS. She denies any flares or active lesions since starting humira.  Patient continues to have chronic sicca symptoms and has occasional sores in her mouth.  She denies any recent rashes.  She has had some increased discomfort in both knee joints which she attributes to having to climb steps while on a cruise recently.  She denies any warmth or swelling in her knee joints. She denies any pain at night. She continues to experience intermittent myofascial pain secondary to fibromyalgia.   Activities of Daily Living:  Patient reports morning stiffness for 15 minutes.   Patient Denies nocturnal pain.  Difficulty dressing/grooming: Reports Difficulty climbing stairs: Reports Difficulty getting out of chair: Denies Difficulty using hands for taps, buttons, cutlery, and/or writing: Denies  Review of Systems  Constitutional:  Positive for fatigue.  HENT:  Positive for mouth sores and mouth dryness.   Eyes:  Positive for dryness.  Respiratory:  Positive for shortness of breath.   Cardiovascular:  Positive for chest pain and palpitations.  Gastrointestinal:  Positive for constipation. Negative for blood in stool and diarrhea.  Endocrine: Negative for increased urination.  Genitourinary:  Negative for involuntary urination.  Musculoskeletal:   Positive for joint pain, gait problem, joint pain, myalgias, muscle weakness, morning stiffness, muscle tenderness and myalgias. Negative for joint swelling.  Skin:  Positive for hair loss, redness and sensitivity to sunlight. Negative for color change and rash.  Allergic/Immunologic: Negative for susceptible to infections.  Neurological:  Positive for dizziness. Negative for headaches.  Hematological:  Negative for swollen glands.  Psychiatric/Behavioral:  Positive for depressed mood. Negative for sleep disturbance. The patient is nervous/anxious.     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   Obesity 04/22/2010   HYPERHIDROSIS 10/15/2009   Genital herpes 09/12/2009   Anxiety and depression 06/18/2009   Essential hypertension 06/18/2009    Past Medical History:  Diagnosis Date   Asthma    childhood   Elevated antinuclear antibody (ANA) level 05/12/2011   Hypertension    Overweight(278.02)     Family History  Problem Relation Age of Onset   Arthritis Mother        osteoarthritis   Diabetes Mother    Hypertension Mother    Mental illness  Mother        schizophrenia   Obesity Mother    Schizophrenia Mother    Hypertension Father    Alcohol abuse Other    Diabetes Other    Hypertension Other    Healthy Son    Hypertension Sister    Past Surgical History:  Procedure Laterality Date   CYST EXCISION     left underarm   LAPAROSCOPIC GASTRIC SLEEVE RESECTION  12/31/2014   SALPINGOOPHORECTOMY  05/26/2005   left   Social  History   Social History Narrative   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  Constitutional:      Appearance: She is well-developed.  HENT:     Head: Normocephalic and atraumatic.  Eyes:     Conjunctiva/sclera: Conjunctivae normal.  Cardiovascular:     Rate and Rhythm: Normal rate and regular rhythm.     Heart sounds: Normal heart sounds.  Pulmonary:     Effort: Pulmonary effort is normal.     Breath sounds: Normal breath sounds.  Abdominal:     General: Bowel sounds are normal.     Palpations: Abdomen is soft.  Musculoskeletal:     Cervical back: Normal range of motion.  Lymphadenopathy:     Cervical: No cervical adenopathy.  Skin:    General: Skin is warm and dry.     Capillary Refill: Capillary refill takes less than 2 seconds.  Neurological:     Mental Status: She is alert and oriented to person, place, and time.  Psychiatric:        Behavior: Behavior normal.      Musculoskeletal Exam: C-spine, thoracic spine, lumbar spine have good range of motion.  Shoulder joints, elbow joints, wrist joints, MCPs, PIPs, DIPs have good range of motion with no synovitis.  Complete fist formation bilaterally.  Hip joints have good range of motion with no groin pain.  Knee joints have good range of motion no warmth or effusion.  Ankle joints have good range of motion with no tenderness or joint swelling. No tenderness or synovitis of MTP joints.   CDAI Exam: CDAI Score: -- Patient Global: --; Provider Global: -- Swollen: --; Tender: -- Joint Exam 03/25/2023   No joint exam has been  documented for this visit   There is currently no information documented on the homunculus. Go to the Rheumatology activity and complete the homunculus joint exam.  Investigation: No additional findings.  Imaging: No results found.  Recent Labs: Lab Results  Component Value Date   WBC 6.9 09/09/2022   HGB 12.7 09/09/2022   PLT 332 09/09/2022   NA 137 09/09/2022   K 4.1 09/09/2022   CL 102 09/09/2022   CO2 28 09/09/2022   GLUCOSE 75 09/09/2022   BUN 14 09/09/2022   CREATININE 1.11 (H) 09/09/2022   BILITOT 0.5 09/09/2022   ALKPHOS 55 12/08/2016   AST 10 09/09/2022   ALT 6 09/09/2022   PROT 6.7 09/09/2022   ALBUMIN 4.0 12/08/2016   CALCIUM 9.3 09/09/2022   GFRAA 67 11/13/2020  [facility] NEGATIVE 04/30/2021    Speciality Comments: PLQ eye exam: 01/14/2022 normal. [facility]- Dr. Genia Del. Follow up in 1 year. Patient missed appt 03/24/2023  Procedures:  No procedures performed Allergies: Fish oil, Peanut butter flavor, Peanuts [peanut oil], Apple juice, Other, and Vilazodone hcl     Assessment / Plan:     Visit Diagnoses: Autoimmune disease (HCC) - AVISE positive 1,1: ANA 1:640H, rheumatoid factor IgMequivocal,+Antiphosphatidylserine/prothrombin IgM, dsDNA-, complements WNL:  Patient has been prescribed Plaquenil 200 mg 1 tablet by mouth twice daily but has only been taking Plaquenil once daily.  She has not had any signs or symptoms of a flare recently.  She continues to have ongoing sicca symptoms and occasional oral ulcers.  She has not had any recent rashes or Raynaud's phenomenon.  She has noted some increased discomfort in both knee joints which she attributes to having to climb stairs more frequently while on a recent cruise.  On examination no warmth or effusion was noted.  She has not had any nocturnal pain.  Discussed the use of topical agents including Voltaren gel.  She will notify us if her symptoms persist or worsen.  No synovitis was noted on  examination today.  She has noticed some increased palpitations intermittently and was encouraged to follow-up with cardiology as she may need a Holter monitor for further evaluation. Lab work from 09/09/2022 was reviewed today in the office: ANA remains positive 1:320NH, Smith antibody negative, phosphatidylserine negative, sed rate within normal limits, complements within normal limits, double-stranded negative. The following lab work will be updated today for further evaluation.  Discussed that she can increase the dose of Plaquenil to 1 tablet twice daily to see if it will alleviate the increased joint pain she has been experiencing. She will follow up in 5 months or sooner if needed.  - Plan: COMPLETE METABOLIC PANEL WITH GFR, Protein / creatinine ratio, urine, CBC with Differential/Platelet, Anti-DNA antibody, double-stranded, C3 and C4, Sedimentation rate  High risk medication use - Plaquenil 200 mg 1 tablet by mouth twice daily.  PLQ eye exam: 01/14/2022 normal. [facility]- Dr. Genia Del.  Patient states that she missed her appointment yesterday but will call to reschedule.  She was given an eye examination form to take with her to her appointment. CBC and CMP updated on 09/09/22.  Orders for CBC and CMP released today.   - Plan: COMPLETE METABOLIC PANEL WITH GFR, CBC with Differential/Platelet  Fibromyalgia: Patient continues to experience intermittent myofascial pain consistent with fibromyalgia flares.  She has a prescription for methocarbamol 500 mg daily as needed for muscle spasms.  Chronic fatigue: Stable.  History of hidradenitis suppurativa - Under the care of dermatology at [facility].  She remains on Humira 40 mg sq injections every 7 days prescribed by dermatology.  She has not had any flares or active lesions since initiating Humira.  Other medical conditions are listed as follows:  History of asthma  History of hypertension: Blood pressure was 120/80 today in the  office.  History of obsessive compulsive disorder  Vitamin D deficiency  Orders: Orders Placed This Encounter  Procedures   COMPLETE METABOLIC PANEL WITH GFR   Protein / creatinine ratio, urine   CBC with Differential/Platelet   Anti-DNA antibody, double-stranded   C3 and C4   Sedimentation rate   No orders of the defined types were placed in this encounter.    Follow-Up Instructions: Return in about 5 months (around 08/23/2023).   Gearldine Bienenstock, PA-C  Note - This record has been created using [employer].  Chart creation errors have been sought, but may not always  have been located. Such creation errors do not reflect on  the standard of medical care.

## 2023-03-17 ENCOUNTER — Ambulatory Visit: Payer: Self-pay | Admitting: Physician Assistant

## 2023-03-17 DIAGNOSIS — R5382 Chronic fatigue, unspecified: Secondary | ICD-10-CM

## 2023-03-17 DIAGNOSIS — M797 Fibromyalgia: Secondary | ICD-10-CM

## 2023-03-17 DIAGNOSIS — Z8709 Personal history of other diseases of the respiratory system: Secondary | ICD-10-CM

## 2023-03-17 DIAGNOSIS — Z872 Personal history of diseases of the skin and subcutaneous tissue: Secondary | ICD-10-CM

## 2023-03-17 DIAGNOSIS — M359 Systemic involvement of connective tissue, unspecified: Secondary | ICD-10-CM

## 2023-03-17 DIAGNOSIS — E559 Vitamin D deficiency, unspecified: Secondary | ICD-10-CM

## 2023-03-17 DIAGNOSIS — Z79899 Other long term (current) drug therapy: Secondary | ICD-10-CM

## 2023-03-17 DIAGNOSIS — Z8679 Personal history of other diseases of the circulatory system: Secondary | ICD-10-CM

## 2023-03-17 DIAGNOSIS — Z8659 Personal history of other mental and behavioral disorders: Secondary | ICD-10-CM

## 2023-03-25 ENCOUNTER — Encounter: Payer: Self-pay | Admitting: Physician Assistant

## 2023-03-25 ENCOUNTER — Ambulatory Visit: Payer: 59 | Attending: Physician Assistant | Admitting: Physician Assistant

## 2023-03-25 VITALS — BP 120/80 | HR 73 | Resp 14 | Ht 66.5 in | Wt 193.0 lb

## 2023-03-25 DIAGNOSIS — Z8709 Personal history of other diseases of the respiratory system: Secondary | ICD-10-CM

## 2023-03-25 DIAGNOSIS — M359 Systemic involvement of connective tissue, unspecified: Secondary | ICD-10-CM | POA: Diagnosis not present

## 2023-03-25 DIAGNOSIS — Z8679 Personal history of other diseases of the circulatory system: Secondary | ICD-10-CM

## 2023-03-25 DIAGNOSIS — M797 Fibromyalgia: Secondary | ICD-10-CM

## 2023-03-25 DIAGNOSIS — R5382 Chronic fatigue, unspecified: Secondary | ICD-10-CM | POA: Diagnosis not present

## 2023-03-25 DIAGNOSIS — Z79899 Other long term (current) drug therapy: Secondary | ICD-10-CM | POA: Diagnosis not present

## 2023-03-25 DIAGNOSIS — Z872 Personal history of diseases of the skin and subcutaneous tissue: Secondary | ICD-10-CM

## 2023-03-25 DIAGNOSIS — E559 Vitamin D deficiency, unspecified: Secondary | ICD-10-CM

## 2023-03-25 DIAGNOSIS — Z8659 Personal history of other mental and behavioral disorders: Secondary | ICD-10-CM

## 2023-03-25 NOTE — Progress Notes (Signed)
ESR WNL

## 2023-03-26 LAB — COMPLETE METABOLIC PANEL WITH GFR
AG Ratio: 1.4 (calc) (ref 1.0–2.5)
ALT: 13 U/L (ref 6–29)
AST: 10 U/L (ref 10–30)
Albumin: 4.1 g/dL (ref 3.6–5.1)
Alkaline phosphatase (APISO): 30 U/L — ABNORMAL LOW (ref 31–125)
BUN/Creatinine Ratio: 10 (calc) (ref 6–22)
BUN: 12 mg/dL (ref 7–25)
CO2: 27 mmol/L (ref 20–32)
Calcium: 9.4 mg/dL (ref 8.6–10.2)
Chloride: 103 mmol/L (ref 98–110)
Creat: 1.18 mg/dL — ABNORMAL HIGH (ref 0.50–0.99)
Globulin: 2.9 g/dL (ref 1.9–3.7)
Glucose, Bld: 79 mg/dL (ref 65–99)
Potassium: 4.4 mmol/L (ref 3.5–5.3)
Sodium: 137 mmol/L (ref 135–146)
Total Bilirubin: 0.5 mg/dL (ref 0.2–1.2)
Total Protein: 7 g/dL (ref 6.1–8.1)
eGFR: 58 mL/min/{1.73_m2} — ABNORMAL LOW (ref >=60)

## 2023-03-26 LAB — C3 AND C4
C3 Complement: 108 mg/dL (ref 83–193)
C4 Complement: 22 mg/dL (ref 15–57)

## 2023-03-26 LAB — ANTI-DNA ANTIBODY, DOUBLE-STRANDED: ds DNA Ab: 1 [IU]/mL

## 2023-03-26 LAB — CBC WITH DIFFERENTIAL/PLATELET
Absolute Lymphocytes: 2861 {cells}/uL (ref 850–3900)
Absolute Monocytes: 403 {cells}/uL (ref 200–950)
Basophils Absolute: 49 {cells}/uL (ref 0–200)
Basophils Relative: 0.8 %
Eosinophils Absolute: 92 {cells}/uL (ref 15–500)
Eosinophils Relative: 1.5 %
HCT: 40.9 % (ref 35.0–45.0)
Hemoglobin: 12.7 g/dL (ref 11.7–15.5)
MCH: 25 pg — ABNORMAL LOW (ref 27.0–33.0)
MCHC: 31.1 g/dL — ABNORMAL LOW (ref 32.0–36.0)
MCV: 80.4 fL (ref 80.0–100.0)
MPV: 10.5 fL (ref 7.5–12.5)
Monocytes Relative: 6.6 %
Neutro Abs: 2696 {cells}/uL (ref 1500–7800)
Neutrophils Relative %: 44.2 %
Platelets: 300 10*3/uL (ref 140–400)
RBC: 5.09 10*6/uL (ref 3.80–5.10)
RDW: 12.5 % (ref 11.0–15.0)
Total Lymphocyte: 46.9 %
WBC: 6.1 10*3/uL (ref 3.8–10.8)

## 2023-03-26 LAB — SEDIMENTATION RATE: Sed Rate: 2 mm/h (ref 0–20)

## 2023-03-27 NOTE — Progress Notes (Signed)
Creatinine is elevated and trending up-1.18 please clarify if she is has been taking any NSAIDs? Any other medication changes?   Alk phos remains low but has improved.  CBC stable.  ESR WNL Complements WNL  dsDNA negative  Labs are not consistent with a flare.

## 2023-04-30 ENCOUNTER — Other Ambulatory Visit: Payer: Self-pay | Admitting: Rheumatology

## 2023-04-30 DIAGNOSIS — M359 Systemic involvement of connective tissue, unspecified: Secondary | ICD-10-CM

## 2023-04-30 NOTE — Telephone Encounter (Signed)
Last Fill: 12/31/2022  Eye exam: 01/14/2022 [facility] PLQ Eye Exam due   Labs: 03/25/2023 Creatinine is elevated and trending up-1.18 please clarify if she is has been taking any NSAIDs? Any other medication changes?   Alk phos remains low but has improved. CBC stable. ESR WNL Complements WNL dsDNA negative Labs are not consistent with a flare.  Next Visit: 09/01/2023  Last Visit: 03/25/2023  DX:Autoimmune disease   Current Dose per office note 03/25/2023: Plaquenil 200 mg 1 tablet by mouth twice daily.   Okay to refill Plaquenil?

## 2023-06-11 ENCOUNTER — Telehealth: Payer: Self-pay | Admitting: *Deleted

## 2023-06-11 NOTE — Telephone Encounter (Signed)
Discussed with Dr. Corliss Skains.  Ok to provide letter stating patient would prefer to attend work event virtually due to risk for infection.

## 2023-06-11 NOTE — Telephone Encounter (Signed)
Patient left message requesting a work note to attend work event virtually instead of traveling. Please advise.

## 2023-06-12 ENCOUNTER — Encounter: Payer: Self-pay | Admitting: *Deleted

## 2023-06-12 NOTE — Telephone Encounter (Signed)
Patient advised we have completed her note. Patient advised she may pick up a copy at the front desk or retrieve it in my chart.

## 2023-07-24 ENCOUNTER — Other Ambulatory Visit: Payer: Self-pay | Admitting: Physician Assistant

## 2023-07-24 DIAGNOSIS — M359 Systemic involvement of connective tissue, unspecified: Secondary | ICD-10-CM

## 2023-07-24 NOTE — Telephone Encounter (Signed)
 Last Fill: 04/30/2023  Eye exam:  07/13/2023 WNL    Labs: 03/25/2023 Creatinine is elevated and trending up-1.18 Alk phos remains low but has improved. CBC stable.  Next Visit: 09/01/2023  Last Visit: 03/25/2023  DX:Autoimmune disease   Current Dose per office note 03/25/2023: Plaquenil 200 mg 1 tablet by mouth twice daily   Okay to refill Plaquenil?

## 2023-08-18 NOTE — Progress Notes (Deleted)
 Office Visit Note  Patient: Whitney Harmon             Date of Birth: 19-Oct-1975           MRN: [id number]             PCP: Bryon Lions PA-C Referring: Rosemary Holms Visit Date: 09/01/2023 Occupation: @GUAROCC @  Subjective:  No chief complaint on file.   History of Present Illness: Whitney Harmon is a 48 y.o. female ***     Activities of Daily Living:  Patient reports morning stiffness for *** {minute/hour:19697}.   Patient {ACTIONS;DENIES/REPORTS:21021675::"Denies"} nocturnal pain.  Difficulty dressing/grooming: {ACTIONS;DENIES/REPORTS:21021675::"Denies"} Difficulty climbing stairs: {ACTIONS;DENIES/REPORTS:21021675::"Denies"} Difficulty getting out of chair: {ACTIONS;DENIES/REPORTS:21021675::"Denies"} Difficulty using hands for taps, buttons, cutlery, and/or writing: {ACTIONS;DENIES/REPORTS:21021675::"Denies"}  No Rheumatology ROS completed.   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   Obesity 04/22/2010   HYPERHIDROSIS 10/15/2009   Genital herpes 09/12/2009   Anxiety and depression 06/18/2009   Essential hypertension 06/18/2009    Past Medical History:  Diagnosis Date   Asthma    childhood   Elevated antinuclear antibody (ANA) level 05/12/2011   Hypertension    Overweight(278.02)     Family History  Problem Relation Age of Onset   Arthritis Mother        osteoarthritis   Diabetes Mother    Hypertension Mother    Mental illness Mother        schizophrenia   Obesity Mother    Schizophrenia  Mother    Hypertension Father    Alcohol abuse Other    Diabetes Other    Hypertension Other    Healthy Son    Hypertension Sister    Past Surgical History:  Procedure Laterality Date   CYST EXCISION     left underarm   LAPAROSCOPIC GASTRIC SLEEVE RESECTION  12/31/2014   SALPINGOOPHORECTOMY  05/26/2005   left   Social History   Social History Narrative   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   Physical Exam   Musculoskeletal Exam: ***  CDAI Exam: CDAI Score: -- Patient Global: --; Provider Global: -- Swollen: --; Tender: -- Joint Exam 09/01/2023   No joint exam has been documented for this visit   There is currently no information documented on the homunculus. Go to the Rheumatology activity and complete the homunculus joint exam.  Investigation: No additional findings.  Imaging: No results found.  Recent Labs: Lab Results  Component Value Date   WBC 6.1 03/25/2023   HGB 12.7 03/25/2023   PLT 300 03/25/2023   NA 137 03/25/2023   K 4.4 03/25/2023   CL 103 03/25/2023   CO2 27 03/25/2023   GLUCOSE 79 03/25/2023   BUN 12 03/25/2023   CREATININE 1.18 (H) 03/25/2023   BILITOT 0.5 03/25/2023   ALKPHOS 55 12/08/2016  AST 10 03/25/2023   ALT 13 03/25/2023   PROT 7.0 03/25/2023   ALBUMIN 4.0 12/08/2016   CALCIUM 9.4 03/25/2023   GFRAA 67 11/13/2020   QFTBGOLDPLUS NEGATIVE 04/30/2021    Speciality Comments: PLQ eye exam: 07/13/2023 WNL @ [facility] Follow up 1 year  Procedures:  No procedures performed Allergies: Fish oil, Peanut butter flavoring agent (non-screening), Peanuts [peanut oil], Apple juice, Other, and Vilazodone hcl   Assessment / Plan:     Visit Diagnoses: No diagnosis found.  Orders: No orders  of the defined types were placed in this encounter.  No orders of the defined types were placed in this encounter.   Face-to-face time spent with patient was *** minutes. Greater than 50% of time was spent in counseling and coordination of care.  Follow-Up Instructions: No follow-ups on file.   Ellen Henri, CMA  Note - This record has been created using Animal nutritionist.  Chart creation errors have been sought, but may not always  have been located. Such creation errors do not reflect on  the standard of medical care.

## 2023-09-01 ENCOUNTER — Ambulatory Visit: Payer: 59 | Admitting: Rheumatology

## 2023-09-01 DIAGNOSIS — M359 Systemic involvement of connective tissue, unspecified: Secondary | ICD-10-CM

## 2023-09-01 DIAGNOSIS — M797 Fibromyalgia: Secondary | ICD-10-CM

## 2023-09-01 DIAGNOSIS — E559 Vitamin D deficiency, unspecified: Secondary | ICD-10-CM

## 2023-09-01 DIAGNOSIS — Z8709 Personal history of other diseases of the respiratory system: Secondary | ICD-10-CM

## 2023-09-01 DIAGNOSIS — Z872 Personal history of diseases of the skin and subcutaneous tissue: Secondary | ICD-10-CM

## 2023-09-01 DIAGNOSIS — Z8679 Personal history of other diseases of the circulatory system: Secondary | ICD-10-CM

## 2023-09-01 DIAGNOSIS — R5382 Chronic fatigue, unspecified: Secondary | ICD-10-CM

## 2023-09-01 DIAGNOSIS — Z79899 Other long term (current) drug therapy: Secondary | ICD-10-CM

## 2023-09-01 DIAGNOSIS — Z8659 Personal history of other mental and behavioral disorders: Secondary | ICD-10-CM

## 2023-09-30 NOTE — Progress Notes (Deleted)
 Office Visit Note  Patient: Whitney Harmon             Date of Birth: 03/16/1976           MRN: [id number]             PCP: Dawayne Estrin PA-C Referring: Dawayne Estrin, PA-C Visit Date: 10/05/2023 Occupation: @GUAROCC @  Subjective:  No chief complaint on file.   History of Present Illness: Whitney Harmon is a 48 y.o. female ***     Activities of Daily Living:  Patient reports morning stiffness for *** {minute/hour:19697}.   Patient {ACTIONS;DENIES/REPORTS:21021675::"Denies"} nocturnal pain.  Difficulty dressing/grooming: {ACTIONS;DENIES/REPORTS:21021675::"Denies"} Difficulty climbing stairs: {ACTIONS;DENIES/REPORTS:21021675::"Denies"} Difficulty getting out of chair: {ACTIONS;DENIES/REPORTS:21021675::"Denies"} Difficulty using hands for taps, buttons, cutlery, and/or writing: {ACTIONS;DENIES/REPORTS:21021675::"Denies"}  No Rheumatology ROS completed.   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   Obesity 04/22/2010   HYPERHIDROSIS 10/15/2009   Genital herpes 09/12/2009   Anxiety and depression 06/18/2009   Essential hypertension 06/18/2009    Past Medical History:  Diagnosis Date   Asthma    childhood   Elevated antinuclear antibody (ANA) level 05/12/2011   Hypertension    Overweight(278.02)     Family History  Problem Relation Age of Onset   Arthritis Mother        osteoarthritis   Diabetes Mother    Hypertension Mother    Mental illness Mother        schizophrenia   Obesity Mother    Schizophrenia  Mother    Hypertension Father    Alcohol abuse Other    Diabetes Other    Hypertension Other    Healthy Son    Hypertension Sister    Past Surgical History:  Procedure Laterality Date   CYST EXCISION     left underarm   LAPAROSCOPIC GASTRIC SLEEVE RESECTION  12/31/2014   SALPINGOOPHORECTOMY  05/26/2005   left   Social History   Social History Narrative   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   Physical Exam   Musculoskeletal Exam: ***  CDAI Exam: CDAI Score: -- Patient Global: --; Provider Global: -- Swollen: --; Tender: -- Joint Exam 10/05/2023   No joint exam has been documented for this visit   There is currently no information documented on the homunculus. Go to the Rheumatology activity and complete the homunculus joint exam.  Investigation: No additional findings.  Imaging: No results found.  Recent Labs: Lab Results  Component Value Date   WBC 6.1 03/25/2023   HGB 12.7 03/25/2023   PLT 300 03/25/2023   NA 137 03/25/2023   K 4.4 03/25/2023   CL 103 03/25/2023   CO2 27 03/25/2023   GLUCOSE 79 03/25/2023   BUN 12 03/25/2023   CREATININE 1.18 (H) 03/25/2023   BILITOT 0.5 03/25/2023   ALKPHOS 55 12/08/2016  AST 10 03/25/2023   ALT 13 03/25/2023   PROT 7.0 03/25/2023   ALBUMIN 4.0 12/08/2016   CALCIUM 9.4 03/25/2023   GFRAA 67 11/13/2020   QFTBGOLDPLUS NEGATIVE 04/30/2021    Speciality Comments: PLQ eye exam: 07/13/2023 WNL @ [facility] Follow up 1 year  Procedures:  No procedures performed Allergies: Fish oil, Peanut butter flavoring agent (non-screening), Peanuts [peanut oil], Apple juice, Other, and Vilazodone  hcl   Assessment / Plan:     Visit Diagnoses: No diagnosis found.  Orders: No orders  of the defined types were placed in this encounter.  No orders of the defined types were placed in this encounter.   Face-to-face time spent with patient was *** minutes. Greater than 50% of time was spent in counseling and coordination of care.  Follow-Up Instructions: No follow-ups on file.   Nicholas Bari, MD  Note - This record has been created using Animal nutritionist.  Chart creation errors have been sought, but may not always  have been located. Such creation errors do not reflect on  the standard of medical care.

## 2023-10-05 ENCOUNTER — Ambulatory Visit: Admitting: Rheumatology

## 2023-10-05 DIAGNOSIS — Z8659 Personal history of other mental and behavioral disorders: Secondary | ICD-10-CM

## 2023-10-05 DIAGNOSIS — Z872 Personal history of diseases of the skin and subcutaneous tissue: Secondary | ICD-10-CM

## 2023-10-05 DIAGNOSIS — Z8679 Personal history of other diseases of the circulatory system: Secondary | ICD-10-CM

## 2023-10-05 DIAGNOSIS — M797 Fibromyalgia: Secondary | ICD-10-CM

## 2023-10-05 DIAGNOSIS — E559 Vitamin D deficiency, unspecified: Secondary | ICD-10-CM

## 2023-10-05 DIAGNOSIS — M359 Systemic involvement of connective tissue, unspecified: Secondary | ICD-10-CM

## 2023-10-05 DIAGNOSIS — Z79899 Other long term (current) drug therapy: Secondary | ICD-10-CM

## 2023-10-05 DIAGNOSIS — Z8709 Personal history of other diseases of the respiratory system: Secondary | ICD-10-CM

## 2023-10-05 DIAGNOSIS — R5382 Chronic fatigue, unspecified: Secondary | ICD-10-CM

## 2023-10-21 ENCOUNTER — Other Ambulatory Visit: Payer: Self-pay | Admitting: Physician Assistant

## 2023-10-21 ENCOUNTER — Other Ambulatory Visit: Payer: Self-pay | Admitting: Rheumatology

## 2023-10-21 DIAGNOSIS — M359 Systemic involvement of connective tissue, unspecified: Secondary | ICD-10-CM

## 2023-10-21 MED ORDER — HYDROXYCHLOROQUINE SULFATE 200 MG PO TABS
200.0000 mg | ORAL_TABLET | Freq: Two times a day (BID) | ORAL | 0 refills | Status: DC
Start: 2023-10-21 — End: 2023-11-18

## 2023-10-21 NOTE — Telephone Encounter (Signed)
 Patient contacted the office to request a medication refill.   1. Name of Medication: Plaquenil   2. How are you currently taking this medication (dosage and times per day)? 200mg    3. What pharmacy would you like for that to be sent to? [facility]- [address]

## 2023-10-21 NOTE — Telephone Encounter (Signed)
 Last Fill: 07/24/2023  Eye exam: 07/13/2023 WNL    Labs: 03/25/2023 Creatinine is elevated and trending up-1.18 Alk phos remains low but has improved. CBC stable.  Next Visit: 11/11/2023  Last Visit: 03/25/2023  DX:Autoimmune disease   Current Dose per office note 03/25/2023: Plaquenil  200 mg 1 tablet by mouth twice daily   Patient to update labs at upcoming appointment on 11/11/2023  Okay to refill Plaquenil ?

## 2023-10-28 NOTE — Progress Notes (Unsigned)
 Office Visit Note  Patient: Whitney Harmon             Date of Birth: 26-Nov-1975           MRN: [id number]             PCP: Dawayne Estrin, PA-C Referring: Moreira, Niall A, PA-C Visit Date: 11/11/2023 Occupation: @GUAROCC @  Subjective:  Pain in multiple joints    History of Present Illness: Whitney Harmon is a 48 y.o. female with history of autoimmune disease and fibromyalgia.  Patient remains on plaquenil  200 mg 1 tablet by mouth twice daily.   She is tolerating Plaquenil  without any side effects and has not had any recent gaps in therapy.  She remains on Humira 40 mg sq injections once weekly for management of hidradenitis suppurativa under the care of dermatology.  She continues to find Whitney Harmon to be effective at managing her symptoms from at bedtime but is unsure if Plaquenil  is managing her symptoms of inflammatory arthritis.  She has had ongoing discomfort in her lower back, both knees, and intermittent pain in both ankle joints.  Patient states that about once a month she has ankle pain lasting for several hours to the point that she has a limp.  Patient states that she has not noticed any warmth or swelling in her ankle joints during this time.  Patient denies any identifiable trigger for the symptoms.  Patient has been taking Celebrex 100 mg twice daily for the past few months to help manage the discomfort in her lower back.  She has also been taking methocarbamol  as needed for muscle spasms. She occasionally takes meloxicam in combination with celebrex if her symptoms are inadequately controlled.      Activities of Daily Living:  Patient reports morning stiffness for 30-60 minutes.   Patient Reports nocturnal pain.  Difficulty dressing/grooming: Reports Difficulty climbing stairs: Reports Difficulty getting out of chair: Denies Difficulty using hands for taps, buttons, cutlery, and/or writing: Denies  Review of Systems  Constitutional:  Positive for fatigue.  HENT:   Positive for mouth dryness. Negative for mouth sores.   Eyes:  Negative for dryness.  Respiratory:  Negative for shortness of breath.   Cardiovascular:  Negative for chest pain and palpitations.  Gastrointestinal:  Positive for constipation. Negative for blood in stool and diarrhea.  Endocrine: Negative for increased urination.  Genitourinary:  Negative for involuntary urination.  Musculoskeletal:  Positive for joint pain, joint pain, myalgias, muscle weakness, morning stiffness, muscle tenderness and myalgias. Negative for gait problem and joint swelling.  Skin:  Positive for hair loss and sensitivity to sunlight. Negative for color change and rash.  Allergic/Immunologic: Negative for susceptible to infections.  Neurological:  Negative for dizziness and headaches.  Hematological:  Negative for swollen glands.  Psychiatric/Behavioral:  Positive for sleep disturbance. Negative for depressed mood. The patient is nervous/anxious.     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   Obesity 04/22/2010   HYPERHIDROSIS 10/15/2009   Genital herpes 09/12/2009   Anxiety and depression  06/18/2009   Essential hypertension 06/18/2009    Past Medical History:  Diagnosis Date   Asthma    childhood   Elevated antinuclear antibody (ANA) level 05/12/2011   Hypertension    Overweight(278.02)     Family History  Problem Relation Age of Onset   Arthritis Mother        osteoarthritis   Diabetes Mother    Hypertension Mother    Mental illness Mother         schizophrenia   Obesity Mother    Schizophrenia Mother    Hypertension Father    Alcohol abuse Other    Diabetes Other    Hypertension Other    Healthy Son    Hypertension Sister    Past Surgical History:  Procedure Laterality Date   CYST EXCISION     left underarm   LAPAROSCOPIC GASTRIC SLEEVE RESECTION  12/31/2014   SALPINGOOPHORECTOMY  05/26/2005   left   Social History   Social History Narrative   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  Constitutional:      Appearance: She is well-developed.  HENT:     Head: Normocephalic and atraumatic.   Eyes:     Conjunctiva/sclera: Conjunctivae normal.    Cardiovascular:     Rate and Rhythm: Normal rate and regular rhythm.     Heart sounds: Normal heart sounds.  Pulmonary:     Effort: Pulmonary effort is normal.     Breath sounds: Normal breath sounds.  Abdominal:     General: Bowel sounds are normal.     Palpations: Abdomen is soft.   Musculoskeletal:     Cervical back: Normal range of motion.  Lymphadenopathy:     Cervical: No cervical adenopathy.   Skin:    General: Skin is warm and dry.     Capillary Refill: Capillary refill takes less than 2 seconds.   Neurological:     Mental Status: She is alert and oriented to person, place, and time.   Psychiatric:        Behavior: Behavior normal.      Musculoskeletal Exam: C-spine has good ROM. Painful and limited mobility of the lumbar spine.  Shoulder joints, elbow joints, wrist joints, MCPs, PIPs, and DIPs good ROM with no synovitis.  Complete fist formation  bilaterally.  Hip joints have good ROM with no groin pain.  Knee joints have discomfort with ROM but no warmth or effusion noted.  Ankle joints have good ROM with some tenderness along the joint line.  No warmth or synovitis of the ankles noted.  No evidence of achilles tendonitis or plantar fasciitis.   CDAI Exam: CDAI Score: -- Patient Global: --; Provider Global: -- Swollen: --; Tender: -- Joint Exam 11/11/2023   No joint exam has been documented for this visit   There is currently no information documented on the homunculus. Go to the Rheumatology activity and complete the homunculus joint exam.  Investigation: No additional findings.  Imaging: No results found.  Recent Labs: Lab Results  Component Value Date  WBC 6.1 03/25/2023   HGB 12.7 03/25/2023   PLT 300 03/25/2023   NA 137 03/25/2023   K 4.4 03/25/2023   CL 103 03/25/2023   CO2 27 03/25/2023   GLUCOSE 79 03/25/2023   BUN 12 03/25/2023   CREATININE 1.18 (H) 03/25/2023   BILITOT 0.5 03/25/2023   ALKPHOS 55 12/08/2016   AST 10 03/25/2023   ALT 13 03/25/2023   PROT 7.0 03/25/2023   ALBUMIN 4.0 12/08/2016   CALCIUM 9.4 03/25/2023   GFRAA 67 11/13/2020   QFTBGOLDPLUS NEGATIVE 04/30/2021    Speciality Comments: PLQ eye exam: 07/13/2023 WNL @ [facility] Follow up 1 year  Procedures:  No procedures performed Allergies: Fish oil, Peanut butter flavoring agent (non-screening), Peanuts [peanut oil], Apple juice, Other, and Vilazodone  hcl    Assessment / Plan:     Visit Diagnoses: Autoimmune disease (HCC) - AVISE positive 1,1: ANA 1:640H, rheumatoid factor IgMequivocal,+Antiphosphatidylserine/prothrombin IgM, dsDNA-, complements WNL: She is not exhibiting any signs or symptoms of a flare.  She continues to have symptoms of chronic dry mouth, fatigue, sun sensitivity, and hair thinning.  She has clinically been doing well taking Plaquenil  200 mg 1 tablet by mouth twice daily.  She is tolerating Plaquenil   without any side effects.  She is unsure if Plaquenil  has been as effective as it once was at managing her joint pain.  No synovitis was noted on examination today.  She has been having ongoing discomfort in her lower back, both knees, and intermittent pain in both ankle joints.  No warmth or effusion noted in her knee joints.  No warmth or swelling of the ankle joints noted.  Plan to check sed rate and CRP today.  Offered to update x-rays and order an MRI for further evaluation of the ankle pain she has been experiencing.  Also discussed the option of following up at [facility] for further evaluation. Lab work from 03/25/23 was reviewed today in the office: Complements WNL, ESR Wnl, and dsDNA is negative.  Plan to update the following lab work today.  She will remain on Plaquenil  as prescribed.  She was advised to notify us  if she develops any new or worsening symptoms. - Plan: Protein / creatinine ratio, urine, CBC with Differential/Platelet, Comprehensive metabolic panel with GFR, Sedimentation rate, C3 and C4, Anti-DNA antibody, double-stranded, ANA, C-reactive protein  High risk medication use - Plaquenil  200 mg 1 tablet by mouth twice daily.  PLQ eye exam: 07/13/2023 WNL @ [facility] Follow up 1 year  CBC and CMP released today.   - Plan: CBC with Differential/Platelet, Comprehensive metabolic panel with GFR  Fibromyalgia - She takes methocarbamol  500 mg daily as needed for muscle spasms.  Chronic pain of both knees: She continues to experience intermittent discomfort in both knee joints.  No warmth or effusion was noted on examination today.  Plan to check ESR and CRP today.   Chronic pain of both ankles: She has been experiencing intermittent discomfort in both ankle joints.  Typically the pain last for several hours but has only been occurring about once a month.  The pain is severe to the point that she limps.  On examination today she has mild tenderness along the joint  line but no warmth or swelling was noted.  Offered to update x-rays or schedule an MRI for further evaluation.  Also discussed the option of establishing care tried for an ankle for further evaluation.  Plan to check ESR and CRP today.  Chronic fatigue: Chronic, stable.  The following lab work obtained today for further evaluation.    History of hidradenitis suppurativa - She remains Humira 40 mg sq injections every 7 days prescribed by dermatology.  Currently well-controlled.  Radiculopathy, lumbar region: Under care of Dr. Rogelia Clarks.  Patient underwent L5-S1 nerve root block/transforaminal epidural injection under fluoroscopy on 10/05/2023.  She has been taking Celebrex 100 mg twice daily and methocarbamol  500 mg daily as needed for symptomatic relief.  She continues to be significant discomfort in her lower back and is unable to exercise due to the severity of symptoms.  Discussed that she may benefit from water aerobics or aquatic therapy.  Other medical conditions are listed as follows:   History of asthma  History of hypertension: Blood pressure was 137/80 today in the office.  History of obsessive compulsive disorder  Vitamin D  deficiency  Orders: Orders Placed This Encounter  Procedures   Protein / creatinine ratio, urine   CBC with Differential/Platelet   Comprehensive metabolic panel with GFR   Sedimentation rate   C3 and C4   Anti-DNA antibody, double-stranded   ANA   C-reactive protein   No orders of the defined types were placed in this encounter.   Follow-Up Instructions: Return in about 6 months (around 05/12/2024) for Autoimmune Disease, Fibromyalgia.   Romayne Clubs, PA-C  Note - This record has been created using Dragon software.  Chart creation errors have been sought, but may not always  have been located. Such creation errors do not reflect on  the standard of medical care.

## 2023-11-11 ENCOUNTER — Ambulatory Visit: Attending: Physician Assistant | Admitting: Physician Assistant

## 2023-11-11 ENCOUNTER — Encounter: Payer: Self-pay | Admitting: Physician Assistant

## 2023-11-11 VITALS — BP 137/80 | HR 69 | Resp 14 | Ht 67.0 in | Wt 206.0 lb

## 2023-11-11 DIAGNOSIS — M5441 Lumbago with sciatica, right side: Secondary | ICD-10-CM

## 2023-11-11 DIAGNOSIS — M25571 Pain in right ankle and joints of right foot: Secondary | ICD-10-CM

## 2023-11-11 DIAGNOSIS — Z8679 Personal history of other diseases of the circulatory system: Secondary | ICD-10-CM

## 2023-11-11 DIAGNOSIS — G8929 Other chronic pain: Secondary | ICD-10-CM

## 2023-11-11 DIAGNOSIS — Z79899 Other long term (current) drug therapy: Secondary | ICD-10-CM | POA: Diagnosis not present

## 2023-11-11 DIAGNOSIS — Z8709 Personal history of other diseases of the respiratory system: Secondary | ICD-10-CM

## 2023-11-11 DIAGNOSIS — M359 Systemic involvement of connective tissue, unspecified: Secondary | ICD-10-CM

## 2023-11-11 DIAGNOSIS — M5442 Lumbago with sciatica, left side: Secondary | ICD-10-CM

## 2023-11-11 DIAGNOSIS — M5416 Radiculopathy, lumbar region: Secondary | ICD-10-CM

## 2023-11-11 DIAGNOSIS — M797 Fibromyalgia: Secondary | ICD-10-CM

## 2023-11-11 DIAGNOSIS — R5382 Chronic fatigue, unspecified: Secondary | ICD-10-CM

## 2023-11-11 DIAGNOSIS — Z8659 Personal history of other mental and behavioral disorders: Secondary | ICD-10-CM

## 2023-11-11 DIAGNOSIS — Z872 Personal history of diseases of the skin and subcutaneous tissue: Secondary | ICD-10-CM

## 2023-11-11 DIAGNOSIS — M25561 Pain in right knee: Secondary | ICD-10-CM | POA: Diagnosis not present

## 2023-11-11 DIAGNOSIS — M25572 Pain in left ankle and joints of left foot: Secondary | ICD-10-CM

## 2023-11-11 DIAGNOSIS — E559 Vitamin D deficiency, unspecified: Secondary | ICD-10-CM

## 2023-11-11 DIAGNOSIS — M25562 Pain in left knee: Secondary | ICD-10-CM

## 2023-11-12 ENCOUNTER — Ambulatory Visit: Payer: Self-pay | Admitting: Physician Assistant

## 2023-11-12 LAB — PROTEIN / CREATININE RATIO, URINE
Protein/Creatinine Ratio: 0.064 mg/mg{creat} (ref 0.024–0.184)
Total Protein, Urine: 9 mg/dL (ref 5–24)

## 2023-11-12 LAB — CBC WITH DIFFERENTIAL/PLATELET
Absolute Lymphocytes: 3160 {cells}/uL (ref 850–3900)
Absolute Monocytes: 483 {cells}/uL (ref 200–950)
Basophils Relative: 0.8 %
Eosinophils Absolute: 128 {cells}/uL (ref 15–500)
HCT: 37.9 % (ref 35.0–45.0)
MCV: 80.6 fL (ref 80.0–100.0)
MPV: 10.6 fL (ref 7.5–12.5)
Platelets: 344 10*3/uL (ref 140–400)

## 2023-11-12 LAB — C3 AND C4
C3 Complement: 135 mg/dL (ref 83–193)
C4 Complement: 29 mg/dL (ref 15–57)

## 2023-11-12 LAB — COMPREHENSIVE METABOLIC PANEL WITH GFR
ALT: 7 U/L (ref 6–29)
Albumin: 3.8 g/dL (ref 3.6–5.1)
Alkaline phosphatase (APISO): 39 U/L (ref 31–125)
BUN: 12 mg/dL (ref 7–25)
Calcium: 9.4 mg/dL (ref 8.6–10.2)
Glucose, Bld: 77 mg/dL (ref 65–99)
Total Bilirubin: 0.4 mg/dL (ref 0.2–1.2)
eGFR: 66 mL/min/{1.73_m2} (ref >=60)

## 2023-11-12 NOTE — Progress Notes (Signed)
 Urine protein creatinine ratio WNL ESR WNL. CRP is elevated.  CBC stable.  Creatinine remains borderline elevated but has improved-1.06.  rest of CMP WNL.

## 2023-11-12 NOTE — Progress Notes (Signed)
Complements WNL

## 2023-11-13 NOTE — Progress Notes (Signed)
 dsDNA is negative-good news! Labs are not concerning for a flare.  ANA is pending.

## 2023-11-14 LAB — COMPREHENSIVE METABOLIC PANEL WITH GFR
AG Ratio: 1.3 (calc) (ref 1.0–2.5)
AST: 10 U/L (ref 10–35)
BUN/Creatinine Ratio: 11 (calc) (ref 6–22)
CO2: 28 mmol/L (ref 20–32)
Chloride: 106 mmol/L (ref 98–110)
Creat: 1.06 mg/dL — ABNORMAL HIGH (ref 0.50–0.99)
Globulin: 3 g/dL (ref 1.9–3.7)
Potassium: 4.7 mmol/L (ref 3.5–5.3)
Sodium: 139 mmol/L (ref 135–146)
Total Protein: 6.8 g/dL (ref 6.1–8.1)

## 2023-11-14 LAB — PROTEIN / CREATININE RATIO, URINE
Creatinine, Urine: 141 mg/dL (ref 20–275)
Protein/Creat Ratio: 64 mg/g{creat} (ref 24–184)

## 2023-11-14 LAB — ANA: Anti Nuclear Antibody (ANA): POSITIVE — AB

## 2023-11-14 LAB — CBC WITH DIFFERENTIAL/PLATELET
Basophils Absolute: 57 {cells}/uL (ref 0–200)
Eosinophils Relative: 1.8 %
Hemoglobin: 12 g/dL (ref 11.7–15.5)
MCH: 25.5 pg — ABNORMAL LOW (ref 27.0–33.0)
MCHC: 31.7 g/dL — ABNORMAL LOW (ref 32.0–36.0)
Monocytes Relative: 6.8 %
Neutro Abs: 3273 {cells}/uL (ref 1500–7800)
Neutrophils Relative %: 46.1 %
RBC: 4.7 10*6/uL (ref 3.80–5.10)
RDW: 12.3 % (ref 11.0–15.0)
Total Lymphocyte: 44.5 %
WBC: 7.1 10*3/uL (ref 3.8–10.8)

## 2023-11-14 LAB — ANTI-NUCLEAR AB-TITER (ANA TITER): ANA Titer 1: 1:320 {titer} — ABNORMAL HIGH

## 2023-11-14 LAB — ANTI-DNA ANTIBODY, DOUBLE-STRANDED: ds DNA Ab: 1 [IU]/mL

## 2023-11-14 LAB — C-REACTIVE PROTEIN: CRP: 10.5 mg/L — ABNORMAL HIGH (ref <8.0)

## 2023-11-14 LAB — SEDIMENTATION RATE: Sed Rate: 2 mm/h (ref 0–20)

## 2023-11-15 NOTE — Progress Notes (Signed)
ANA remains positive-titer unchanged.

## 2023-11-18 ENCOUNTER — Other Ambulatory Visit: Payer: Self-pay | Admitting: Physician Assistant

## 2023-11-18 DIAGNOSIS — M359 Systemic involvement of connective tissue, unspecified: Secondary | ICD-10-CM

## 2023-11-18 NOTE — Telephone Encounter (Signed)
 Last Fill: 10/21/2023  Eye exam: 07/13/2023 WNL   Labs: 11/11/2023 Urine protein creatinine ratio WNL ESR WNL. CRP is elevated.  CBC stable.  Creatinine remains borderline elevated but has improved-1.06.  rest of CMP WNL.  Complements WNL  dsDNA is negative-good news! Labs are not concerning for a flare.  ANA remains positive-titer unchanged   Next Visit: 05/12/2024  Last Visit: 11/11/2023  IK:Jlunpfflwz disease (HCC)   Current Dose per office note 11/11/2023: Plaquenil  200 mg 1 tablet by mouth twice daily.   Okay to refill Plaquenil ?

## 2023-12-07 ENCOUNTER — Telehealth: Payer: Self-pay | Admitting: *Deleted

## 2023-12-07 NOTE — Telephone Encounter (Signed)
 Patient contacted the office and requested a letter for her job to allow her to attend a work meeting virtually instead of travelling to it in person. Patient states she needs this due to mobility. Please advise.

## 2023-12-07 NOTE — Telephone Encounter (Signed)
 Ok to provide note

## 2023-12-08 ENCOUNTER — Encounter: Payer: Self-pay | Admitting: *Deleted

## 2023-12-08 NOTE — Telephone Encounter (Signed)
 Left message to advised patient we will provide letter for her. Patient advised she may retrieve it through my chart or she may contact us  if she needs a physical copy printed.

## 2023-12-09 NOTE — Telephone Encounter (Signed)
 Patient called the office stating she could not see her letter we sent, advised she would need to log into [place] to be able to see the letter from us . Patient was only seeing the [facility]. Directed patient to add [facility] as another organization. Patient was then able to see the letter.

## 2023-12-30 ENCOUNTER — Encounter: Payer: Self-pay | Admitting: Internal Medicine

## 2024-02-15 ENCOUNTER — Telehealth: Payer: Self-pay | Admitting: *Deleted

## 2024-02-15 NOTE — Telephone Encounter (Signed)
 Patient contacted the office stating she is having pain in her ankle that she has discussed with you previously. She states the pain had been coming and going. Patient states she has now had the pain for the past week. Patient states it is an aching pain. Patient states it has progressively gotten worse. Patient states the pain has travelled from the front of her leg up to her shin. Patient states it hurts to touch and it is numb as well as a burning pain. Patient states it wakes her up out of her sleep with a throbbing pain. Patient states she is having trouble walking. Patient states she has tried Tylenol  and propping it up as well as ice. Please advise.

## 2024-02-15 NOTE — Telephone Encounter (Signed)
 X-rays declined last visit.  Recommend evaluation at [facility] since her symptoms have worsened.

## 2024-02-16 NOTE — Telephone Encounter (Signed)
 Attempted to contact the patient and left message for patient to call the office.

## 2024-02-16 NOTE — Telephone Encounter (Signed)
 Patient returned the call and has been advised,  X-rays declined last visit.  Recommend evaluation at [facility] since her symptoms have worsened.       Patient verbalized understanding and was provided the phone number for [facility].

## 2024-03-01 ENCOUNTER — Encounter

## 2024-03-03 ENCOUNTER — Other Ambulatory Visit: Payer: Self-pay | Admitting: Physician Assistant

## 2024-03-03 DIAGNOSIS — M359 Systemic involvement of connective tissue, unspecified: Secondary | ICD-10-CM

## 2024-03-03 NOTE — Telephone Encounter (Signed)
 Last Fill: 11/18/2023  Eye exam: 07/13/2023 WNL   Labs: 11/11/2023 Urine protein creatinine ratio WNL ESR WNL. CRP is elevated.  CBC stable.  Creatinine remains borderline elevated but has improved-1.06.  rest of CMP WNL.     Next Visit: 05/12/2024  Last Visit: 11/11/2023  DX: Autoimmune disease   Current Dose per office note 11/11/2023: Plaquenil  200 mg 1 tablet by mouth twice daily   Okay to refill Plaquenil ?

## 2024-03-16 NOTE — Progress Notes (Signed)
 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 Please continue with plans for repeat medical branch block and consider right L4-S1 TFESI for radiating leg pain at later date.

## 2024-03-18 ENCOUNTER — Encounter: Admitting: Internal Medicine

## 2024-04-28 NOTE — Progress Notes (Deleted)
 Office Visit Note  Patient: Whitney Harmon             Date of Birth: 1976-01-26           MRN: [id number]             PCP: Valma Lannie LABOR PA-C Referring: Valma Lannie LABOR, PA-C Visit Date: 05/12/2024 Occupation: Data Unavailable  Subjective:  No chief complaint on file.   History of Present Illness: Whitney Harmon is a 48 y.o. female ***     Activities of Daily Living:  Patient reports morning stiffness for *** {minute/hour:19697}.   Patient {ACTIONS;DENIES/REPORTS:21021675::Denies} nocturnal pain.  Difficulty dressing/grooming: {ACTIONS;DENIES/REPORTS:21021675::Denies} Difficulty climbing stairs: {ACTIONS;DENIES/REPORTS:21021675::Denies} Difficulty getting out of chair: {ACTIONS;DENIES/REPORTS:21021675::Denies} Difficulty using hands for taps, buttons, cutlery, and/or writing: {ACTIONS;DENIES/REPORTS:21021675::Denies}  No Rheumatology ROS completed.   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   Obesity 04/22/2010   HYPERHIDROSIS 10/15/2009   Genital herpes 09/12/2009   Anxiety and depression 06/18/2009   Essential hypertension 06/18/2009    Past Medical History:  Diagnosis Date   Asthma    childhood   Elevated antinuclear antibody (ANA) level 05/12/2011   Hypertension    Overweight(278.02)     Family History  Problem Relation Age of Onset   Arthritis Mother        osteoarthritis   Diabetes Mother    Hypertension Mother    Mental illness Mother        schizophrenia   Obesity Mother     Schizophrenia Mother    Hypertension Father    Alcohol abuse Other    Diabetes Other    Hypertension Other    Healthy Son    Hypertension Sister    Past Surgical History:  Procedure Laterality Date   CYST EXCISION     left underarm   LAPAROSCOPIC GASTRIC SLEEVE RESECTION  12/31/2014   SALPINGOOPHORECTOMY  05/26/2005   left   Social History   Tobacco Use   Smoking status: Never    Passive exposure: Current   Smokeless tobacco: Never  Vaping Use   Vaping status: Never Used  Substance Use Topics   Alcohol use: Yes    Alcohol/week: 1.0 standard drink of alcohol    Types: 1 Glasses of wine per week    Comment: Social   Drug use: No   Social History   Social History Narrative   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   Physical Exam   Musculoskeletal Exam: ***  CDAI Exam: CDAI Score: -- Patient Global: --; Provider Global: -- Swollen: --; Tender: -- Joint Exam 05/12/2024   No joint exam has been documented for this visit   There is currently no information documented on the homunculus. Go to the Rheumatology activity and complete the homunculus joint exam.  Investigation: No additional findings.  Imaging: No results found.  Recent Labs: Lab Results  Component Value Date   WBC 7.1 11/11/2023   HGB 12.0 11/11/2023   PLT 344 11/11/2023   NA 139 11/11/2023   K 4.7 11/11/2023   CL 106 11/11/2023   CO2 28 11/11/2023   GLUCOSE 77 11/11/2023   BUN 12 11/11/2023   CREATININE 1.06 (H) 11/11/2023   BILITOT 0.4 11/11/2023   ALKPHOS 55 12/08/2016   AST 10 11/11/2023   ALT 7 11/11/2023   PROT 6.8 11/11/2023   ALBUMIN 4.0 12/08/2016   CALCIUM 9.4 11/11/2023   GFRAA 67 11/13/2020   QFTBGOLDPLUS NEGATIVE 04/30/2021     Speciality Comments: PLQ eye exam: 07/13/2023 WNL @ [facility] Follow up 1 year  Procedures:  No procedures performed Allergies: Fish oil, Peanut butter flavoring agent (non-screening), Peanuts [peanut oil], Apple juice, Other, and Vilazodone  hcl   Assessment / Plan:     Visit Diagnoses: No diagnosis found.  Orders: No orders of the defined types were placed in this encounter.  No orders of the defined types were placed in this encounter.   Face-to-face time spent with patient was *** minutes. Greater than 50% of time was spent in counseling and coordination of care.  Follow-Up Instructions: No follow-ups on file.   Maya Nash, MD  Note - This record has been created using Animal nutritionist.  Chart creation errors have been sought, but may not always  have been located. Such creation errors do not reflect on  the standard of medical care.

## 2024-05-10 NOTE — Progress Notes (Deleted)
 Office Visit Note  Patient: Whitney Harmon             Date of Birth: 1975-09-12           MRN: [id number]             PCP: Moreira, Niall A, PA-C Referring: Valma Lannie LABOR, PA-C Visit Date: 05/17/2024 Occupation: Data Unavailable  Subjective:  No chief complaint on file.   History of Present Illness: Whitney Harmon is a 48 y.o. female ***     Activities of Daily Living:  Patient reports morning stiffness for *** {minute/hour:19697}.   Patient {ACTIONS;DENIES/REPORTS:21021675::Denies} nocturnal pain.  Difficulty dressing/grooming: {ACTIONS;DENIES/REPORTS:21021675::Denies} Difficulty climbing stairs: {ACTIONS;DENIES/REPORTS:21021675::Denies} Difficulty getting out of chair: {ACTIONS;DENIES/REPORTS:21021675::Denies} Difficulty using hands for taps, buttons, cutlery, and/or writing: {ACTIONS;DENIES/REPORTS:21021675::Denies}  No Rheumatology ROS completed.   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   Obesity 04/22/2010   HYPERHIDROSIS 10/15/2009   Genital herpes 09/12/2009   Anxiety and depression 06/18/2009   Essential hypertension 06/18/2009    Past Medical History:  Diagnosis Date   Asthma    childhood   Elevated antinuclear antibody (ANA) level 05/12/2011   Hypertension    Overweight(278.02)     Family History  Problem Relation Age of Onset   Arthritis Mother        osteoarthritis   Diabetes Mother    Hypertension Mother    Mental illness Mother        schizophrenia   Obesity Mother     Schizophrenia Mother    Hypertension Father    Alcohol abuse Other    Diabetes Other    Hypertension Other    Healthy Son    Hypertension Sister    Past Surgical History:  Procedure Laterality Date   CYST EXCISION     left underarm   LAPAROSCOPIC GASTRIC SLEEVE RESECTION  12/31/2014   SALPINGOOPHORECTOMY  05/26/2005   left   Social History[1] Social History   Social History Narrative   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   Physical Exam   Musculoskeletal Exam: ***  CDAI Exam: CDAI Score: -- Patient Global: --; Provider Global: -- Swollen: --; Tender: -- Joint Exam 05/17/2024   No joint exam has been documented for this visit   There is currently no information documented on the homunculus. Go to the Rheumatology activity and complete the homunculus joint exam.  Investigation: No additional findings.  Imaging: No results found.  Recent Labs: Lab Results  Component Value Date   WBC 7.1 11/11/2023   HGB 12.0 11/11/2023   PLT 344 11/11/2023   NA 139 11/11/2023   K 4.7 11/11/2023   CL 106 11/11/2023   CO2 28 11/11/2023   GLUCOSE 77 11/11/2023   BUN 12 11/11/2023   CREATININE 1.06 (H) 11/11/2023   BILITOT 0.4 11/11/2023  ALKPHOS 55 12/08/2016   AST 10 11/11/2023   ALT 7 11/11/2023   PROT 6.8 11/11/2023   ALBUMIN 4.0 12/08/2016   CALCIUM 9.4 11/11/2023   GFRAA 67 11/13/2020   QFTBGOLDPLUS NEGATIVE 04/30/2021    Speciality Comments: PLQ eye exam: 07/13/2023 WNL @ [facility] Follow up 1 year  Procedures:  No procedures performed Allergies: Fish oil, Peanut butter flavoring agent (non-screening), Peanuts [peanut oil], Apple juice, Other, and Vilazodone  hcl   Assessment / Plan:     Visit Diagnoses: No  diagnosis found.  Orders: No orders of the defined types were placed in this encounter.  No orders of the defined types were placed in this encounter.   Face-to-face time spent with patient was *** minutes. Greater than 50% of time was spent in counseling and coordination of care.  Follow-Up Instructions: No follow-ups on file.   Maya Nash, MD  Note - This record has been created using Animal nutritionist.  Chart creation errors have been sought, but may not always  have been located. Such creation errors do not reflect on  the standard of medical care.    [1]  Social History Tobacco Use   Smoking status: Never    Passive exposure: Current   Smokeless tobacco: Never  Vaping Use   Vaping status: Never Used  Substance Use Topics   Alcohol use: Yes    Alcohol/week: 1.0 standard drink of alcohol    Types: 1 Glasses of wine per week    Comment: Social   Drug use: No

## 2024-05-12 ENCOUNTER — Ambulatory Visit: Admitting: Rheumatology

## 2024-05-12 DIAGNOSIS — Z8709 Personal history of other diseases of the respiratory system: Secondary | ICD-10-CM

## 2024-05-12 DIAGNOSIS — Z872 Personal history of diseases of the skin and subcutaneous tissue: Secondary | ICD-10-CM

## 2024-05-12 DIAGNOSIS — Z79899 Other long term (current) drug therapy: Secondary | ICD-10-CM

## 2024-05-12 DIAGNOSIS — M359 Systemic involvement of connective tissue, unspecified: Secondary | ICD-10-CM

## 2024-05-12 DIAGNOSIS — M797 Fibromyalgia: Secondary | ICD-10-CM

## 2024-05-12 DIAGNOSIS — Z8659 Personal history of other mental and behavioral disorders: Secondary | ICD-10-CM

## 2024-05-12 DIAGNOSIS — G8929 Other chronic pain: Secondary | ICD-10-CM

## 2024-05-12 DIAGNOSIS — R5382 Chronic fatigue, unspecified: Secondary | ICD-10-CM

## 2024-05-12 DIAGNOSIS — E559 Vitamin D deficiency, unspecified: Secondary | ICD-10-CM

## 2024-05-12 DIAGNOSIS — Z8679 Personal history of other diseases of the circulatory system: Secondary | ICD-10-CM

## 2024-05-17 ENCOUNTER — Ambulatory Visit: Admitting: Rheumatology

## 2024-06-02 ENCOUNTER — Encounter: Payer: Self-pay | Admitting: Rheumatology

## 2024-06-02 NOTE — Telephone Encounter (Signed)
Okay to give the note.

## 2024-06-03 ENCOUNTER — Other Ambulatory Visit: Payer: Self-pay | Admitting: Physician Assistant

## 2024-06-03 DIAGNOSIS — M359 Systemic involvement of connective tissue, unspecified: Secondary | ICD-10-CM

## 2024-06-03 DIAGNOSIS — Z79899 Other long term (current) drug therapy: Secondary | ICD-10-CM

## 2024-06-03 NOTE — Telephone Encounter (Signed)
 Last Fill: 03/03/2024  Eye exam: 07/13/2023 WNL   Labs: 11/11/2023 Urine protein creatinine ratio WNL ESR WNL. CRP is elevated.  CBC stable.  Creatinine remains borderline elevated but has improved-1.06.  rest of CMP WNL  Next Visit: 06/16/2024  Last Visit: 11/11/2023  IK:Jlunpfflwz disease   Current Dose per office note 11/11/2023: Plaquenil  200 mg 1 tablet by mouth twice daily.   Left message to advise patient she is due to update her labs.   Okay to refill Plaquenil ?

## 2024-06-06 ENCOUNTER — Encounter: Payer: Self-pay | Admitting: *Deleted

## 2024-06-07 NOTE — Progress Notes (Unsigned)
 "  Office Visit Note  Patient: Whitney QUIZHPI             Date of Birth: January 12, 1976           MRN: [id number]             PCP: Valma Lannie LABOR PA-C Referring: Valma Lannie LABOR, PA-C Visit Date: 06/16/2024 Occupation: Data Unavailable  Subjective:  No chief complaint on file.   History of Present Illness: Whitney Harmon is a 49 y.o. female ***     Activities of Daily Living:  Patient reports morning stiffness for *** {minute/hour:19697}.   Patient {ACTIONS;DENIES/REPORTS:21021675::Denies} nocturnal pain.  Difficulty dressing/grooming: {ACTIONS;DENIES/REPORTS:21021675::Denies} Difficulty climbing stairs: {ACTIONS;DENIES/REPORTS:21021675::Denies} Difficulty getting out of chair: {ACTIONS;DENIES/REPORTS:21021675::Denies} Difficulty using hands for taps, buttons, cutlery, and/or writing: {ACTIONS;DENIES/REPORTS:21021675::Denies}  No Rheumatology ROS completed.   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   Obesity 04/22/2010   HYPERHIDROSIS 10/15/2009   Genital herpes 09/12/2009   Anxiety and depression 06/18/2009   Essential hypertension 06/18/2009    Past Medical History:  Diagnosis Date   Asthma    childhood   Elevated antinuclear antibody (ANA) level 05/12/2011   Hypertension    Overweight(278.02)     Family History  Problem Relation Age of Onset   Arthritis Mother        osteoarthritis   Diabetes Mother    Hypertension Mother    Mental illness Mother        schizophrenia   Obesity Mother     Schizophrenia Mother    Hypertension Father    Alcohol abuse Other    Diabetes Other    Hypertension Other    Healthy Son    Hypertension Sister    Past Surgical History:  Procedure Laterality Date   CYST EXCISION     left underarm   LAPAROSCOPIC GASTRIC SLEEVE RESECTION  12/31/2014   SALPINGOOPHORECTOMY  05/26/2005   left   Social History[1] Social History   Social History Narrative   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   Physical Exam   Musculoskeletal Exam: ***  CDAI Exam: CDAI Score: -- Patient Global: --; Provider Global: -- Swollen: --; Tender: -- Joint Exam 06/16/2024   No joint exam has been documented for this visit   There is currently no information documented on the homunculus. Go to the Rheumatology activity and complete the homunculus joint exam.  Investigation: No additional findings.  Imaging: No results found.  Recent Labs: Lab Results  Component Value Date   WBC 7.1 11/11/2023   HGB 12.0 11/11/2023   PLT 344 11/11/2023   NA 139 11/11/2023   K 4.7 11/11/2023   CL 106 11/11/2023   CO2 28 11/11/2023   GLUCOSE 77 11/11/2023   BUN 12 11/11/2023   CREATININE 1.06 (H) 11/11/2023   BILITOT 0.4  11/11/2023   ALKPHOS 55 12/08/2016   AST 10 11/11/2023   ALT 7 11/11/2023   PROT 6.8 11/11/2023   ALBUMIN 4.0 12/08/2016   CALCIUM 9.4 11/11/2023   GFRAA 67 11/13/2020   QFTBGOLDPLUS NEGATIVE 04/30/2021    Speciality Comments: PLQ eye exam: 07/13/2023 WNL @ [facility] Follow up 1 year  Procedures:  No procedures performed Allergies: Fish oil, Peanut butter flavoring agent (non-screening), Peanuts [peanut oil], Apple juice, Other, and Vilazodone  hcl   Assessment / Plan:     Visit Diagnoses: No  diagnosis found.  Orders: No orders of the defined types were placed in this encounter.  No orders of the defined types were placed in this encounter.   Face-to-face time spent with patient was *** minutes. Greater than 50% of time was spent in counseling and coordination of care.  Follow-Up Instructions: No follow-ups on file.   Maya Nash, MD  Note - This record has been created using Animal nutritionist.  Chart creation errors have been sought, but may not always  have been located. Such creation errors do not reflect on  the standard of medical care.    [1]  Social History Tobacco Use   Smoking status: Never    Passive exposure: Current   Smokeless tobacco: Never  Vaping Use   Vaping status: Never Used  Substance Use Topics   Alcohol use: Yes    Alcohol/week: 1.0 standard drink of alcohol    Types: 1 Glasses of wine per week    Comment: Social   Drug use: No   "

## 2024-06-16 ENCOUNTER — Ambulatory Visit: Admitting: Rheumatology

## 2024-06-16 ENCOUNTER — Encounter: Payer: Self-pay | Admitting: Rheumatology

## 2024-06-16 DIAGNOSIS — G8929 Other chronic pain: Secondary | ICD-10-CM

## 2024-06-16 DIAGNOSIS — Z8709 Personal history of other diseases of the respiratory system: Secondary | ICD-10-CM

## 2024-06-16 DIAGNOSIS — R5382 Chronic fatigue, unspecified: Secondary | ICD-10-CM

## 2024-06-16 DIAGNOSIS — M359 Systemic involvement of connective tissue, unspecified: Secondary | ICD-10-CM

## 2024-06-16 DIAGNOSIS — Z872 Personal history of diseases of the skin and subcutaneous tissue: Secondary | ICD-10-CM

## 2024-06-16 DIAGNOSIS — Z79899 Other long term (current) drug therapy: Secondary | ICD-10-CM

## 2024-06-16 DIAGNOSIS — M797 Fibromyalgia: Secondary | ICD-10-CM

## 2024-06-16 DIAGNOSIS — Z8659 Personal history of other mental and behavioral disorders: Secondary | ICD-10-CM

## 2024-06-16 DIAGNOSIS — E559 Vitamin D deficiency, unspecified: Secondary | ICD-10-CM

## 2024-06-16 DIAGNOSIS — Z8679 Personal history of other diseases of the circulatory system: Secondary | ICD-10-CM

## 2024-06-16 DIAGNOSIS — M5416 Radiculopathy, lumbar region: Secondary | ICD-10-CM

## 2024-07-19 ENCOUNTER — Ambulatory Visit: Admitting: Rheumatology
